# Patient Record
Sex: Male | Born: 1935 | Race: White | Hispanic: No | Marital: Married | Smoking: Never smoker
Health system: Southern US, Community
[De-identification: ages and names within clinical notes are randomized; demographics above are authoritative.]

## PROBLEM LIST (undated history)

## (undated) DIAGNOSIS — I10 Essential (primary) hypertension: Secondary | ICD-10-CM

## (undated) DIAGNOSIS — K589 Irritable bowel syndrome without diarrhea: Secondary | ICD-10-CM

## (undated) DIAGNOSIS — K219 Gastro-esophageal reflux disease without esophagitis: Secondary | ICD-10-CM

## (undated) DIAGNOSIS — N400 Enlarged prostate without lower urinary tract symptoms: Secondary | ICD-10-CM

## (undated) DIAGNOSIS — D126 Benign neoplasm of colon, unspecified: Secondary | ICD-10-CM

## (undated) DIAGNOSIS — C439 Malignant melanoma of skin, unspecified: Secondary | ICD-10-CM

## (undated) DIAGNOSIS — I639 Cerebral infarction, unspecified: Secondary | ICD-10-CM

## (undated) DIAGNOSIS — M4802 Spinal stenosis, cervical region: Secondary | ICD-10-CM

## (undated) DIAGNOSIS — N200 Calculus of kidney: Secondary | ICD-10-CM

## (undated) DIAGNOSIS — E119 Type 2 diabetes mellitus without complications: Secondary | ICD-10-CM

## (undated) DIAGNOSIS — F419 Anxiety disorder, unspecified: Secondary | ICD-10-CM

## (undated) DIAGNOSIS — H9190 Unspecified hearing loss, unspecified ear: Secondary | ICD-10-CM

## (undated) HISTORY — DX: Spinal stenosis, cervical region: M48.02

## (undated) HISTORY — DX: Type 2 diabetes mellitus without complications: E11.9

## (undated) HISTORY — PX: COLONOSCOPY W/ POLYPECTOMY: SHX1380

## (undated) HISTORY — DX: Benign prostatic hyperplasia without lower urinary tract symptoms: N40.0

## (undated) HISTORY — DX: Calculus of kidney: N20.0

## (undated) HISTORY — DX: Unspecified hearing loss, unspecified ear: H91.90

## (undated) HISTORY — PX: CATARACT EXTRACTION: SUR2

## (undated) HISTORY — DX: Cerebral infarction, unspecified: I63.9

## (undated) HISTORY — DX: Anxiety disorder, unspecified: F41.9

## (undated) HISTORY — DX: Benign neoplasm of colon, unspecified: D12.6

## (undated) HISTORY — DX: Irritable bowel syndrome, unspecified: K58.9

## (undated) HISTORY — PX: CHOLECYSTECTOMY: SHX55

## (undated) HISTORY — DX: Essential (primary) hypertension: I10

## (undated) HISTORY — PX: CYSTOSCOPY: SUR368

## (undated) HISTORY — DX: Malignant melanoma of skin, unspecified: C43.9

## (undated) HISTORY — DX: Gastro-esophageal reflux disease without esophagitis: K21.9

---

## 1995-09-17 ENCOUNTER — Encounter: Payer: Self-pay | Admitting: Gastroenterology

## 1995-11-22 ENCOUNTER — Encounter: Payer: Self-pay | Admitting: Gastroenterology

## 1995-11-22 ENCOUNTER — Encounter: Payer: Self-pay | Admitting: Internal Medicine

## 1995-11-23 ENCOUNTER — Encounter: Payer: Self-pay | Admitting: Gastroenterology

## 1996-11-27 DIAGNOSIS — D126 Benign neoplasm of colon, unspecified: Secondary | ICD-10-CM

## 1996-11-27 HISTORY — DX: Benign neoplasm of colon, unspecified: D12.6

## 1996-12-14 ENCOUNTER — Encounter: Payer: Self-pay | Admitting: Gastroenterology

## 1999-11-28 ENCOUNTER — Encounter (INDEPENDENT_AMBULATORY_CARE_PROVIDER_SITE_OTHER): Payer: Self-pay | Admitting: Specialist

## 1999-11-28 ENCOUNTER — Encounter: Payer: Self-pay | Admitting: Gastroenterology

## 1999-11-28 ENCOUNTER — Other Ambulatory Visit: Admission: RE | Admit: 1999-11-28 | Discharge: 1999-11-28 | Payer: Self-pay | Admitting: Gastroenterology

## 2000-08-28 ENCOUNTER — Encounter: Payer: Self-pay | Admitting: Internal Medicine

## 2000-08-28 ENCOUNTER — Ambulatory Visit (HOSPITAL_COMMUNITY): Admission: RE | Admit: 2000-08-28 | Discharge: 2000-08-28 | Payer: Self-pay | Admitting: Internal Medicine

## 2002-08-07 ENCOUNTER — Encounter: Payer: Self-pay | Admitting: Internal Medicine

## 2002-08-07 ENCOUNTER — Encounter: Admission: RE | Admit: 2002-08-07 | Discharge: 2002-08-07 | Payer: Self-pay | Admitting: Internal Medicine

## 2004-10-24 ENCOUNTER — Ambulatory Visit: Payer: Self-pay | Admitting: Internal Medicine

## 2005-06-26 ENCOUNTER — Encounter: Admission: RE | Admit: 2005-06-26 | Discharge: 2005-06-26 | Payer: Self-pay | Admitting: Urology

## 2005-07-02 ENCOUNTER — Encounter (INDEPENDENT_AMBULATORY_CARE_PROVIDER_SITE_OTHER): Payer: Self-pay | Admitting: *Deleted

## 2005-07-02 ENCOUNTER — Ambulatory Visit (HOSPITAL_COMMUNITY): Admission: RE | Admit: 2005-07-02 | Discharge: 2005-07-02 | Payer: Self-pay | Admitting: Urology

## 2005-07-02 ENCOUNTER — Ambulatory Visit (HOSPITAL_BASED_OUTPATIENT_CLINIC_OR_DEPARTMENT_OTHER): Admission: RE | Admit: 2005-07-02 | Discharge: 2005-07-02 | Payer: Self-pay | Admitting: Urology

## 2005-08-06 ENCOUNTER — Ambulatory Visit: Payer: Self-pay | Admitting: Internal Medicine

## 2005-08-08 ENCOUNTER — Ambulatory Visit: Payer: Self-pay | Admitting: Internal Medicine

## 2005-12-11 ENCOUNTER — Ambulatory Visit: Payer: Self-pay | Admitting: Internal Medicine

## 2005-12-18 ENCOUNTER — Ambulatory Visit: Payer: Self-pay | Admitting: Internal Medicine

## 2006-02-02 ENCOUNTER — Ambulatory Visit: Payer: Self-pay | Admitting: Gastroenterology

## 2006-02-09 ENCOUNTER — Ambulatory Visit: Payer: Self-pay | Admitting: Gastroenterology

## 2006-02-09 ENCOUNTER — Encounter (INDEPENDENT_AMBULATORY_CARE_PROVIDER_SITE_OTHER): Payer: Self-pay | Admitting: *Deleted

## 2006-02-09 LAB — HM COLONOSCOPY

## 2006-03-16 ENCOUNTER — Ambulatory Visit: Payer: Self-pay | Admitting: Gastroenterology

## 2006-04-23 ENCOUNTER — Ambulatory Visit: Payer: Self-pay | Admitting: Internal Medicine

## 2006-05-27 ENCOUNTER — Ambulatory Visit: Payer: Self-pay | Admitting: Internal Medicine

## 2006-07-08 ENCOUNTER — Ambulatory Visit: Payer: Self-pay

## 2006-09-04 ENCOUNTER — Ambulatory Visit: Payer: Self-pay | Admitting: Internal Medicine

## 2006-09-04 LAB — CONVERTED CEMR LAB
ALT: 16 units/L (ref 0–40)
BUN: 21 mg/dL (ref 6–23)
Cholesterol: 100 mg/dL (ref 0–200)
Hgb A1c MFr Bld: 6.1 % — ABNORMAL HIGH (ref 4.6–6.0)
LDL Cholesterol: 48 mg/dL (ref 0–99)

## 2006-09-16 ENCOUNTER — Ambulatory Visit: Payer: Self-pay | Admitting: Internal Medicine

## 2007-01-21 DIAGNOSIS — I152 Hypertension secondary to endocrine disorders: Secondary | ICD-10-CM | POA: Insufficient documentation

## 2007-01-21 DIAGNOSIS — Z86010 Personal history of colonic polyps: Secondary | ICD-10-CM | POA: Insufficient documentation

## 2007-01-21 DIAGNOSIS — M4802 Spinal stenosis, cervical region: Secondary | ICD-10-CM | POA: Insufficient documentation

## 2007-01-21 DIAGNOSIS — N400 Enlarged prostate without lower urinary tract symptoms: Secondary | ICD-10-CM | POA: Insufficient documentation

## 2007-01-21 DIAGNOSIS — Z85820 Personal history of malignant melanoma of skin: Secondary | ICD-10-CM | POA: Insufficient documentation

## 2007-01-21 DIAGNOSIS — K589 Irritable bowel syndrome without diarrhea: Secondary | ICD-10-CM | POA: Insufficient documentation

## 2007-01-21 DIAGNOSIS — C439 Malignant melanoma of skin, unspecified: Secondary | ICD-10-CM

## 2007-01-21 DIAGNOSIS — I10 Essential (primary) hypertension: Secondary | ICD-10-CM | POA: Insufficient documentation

## 2007-03-10 ENCOUNTER — Ambulatory Visit: Payer: Self-pay | Admitting: Internal Medicine

## 2007-03-10 DIAGNOSIS — R1013 Epigastric pain: Secondary | ICD-10-CM

## 2007-03-10 DIAGNOSIS — R059 Cough, unspecified: Secondary | ICD-10-CM | POA: Insufficient documentation

## 2007-03-10 DIAGNOSIS — R12 Heartburn: Secondary | ICD-10-CM | POA: Insufficient documentation

## 2007-03-10 DIAGNOSIS — K3189 Other diseases of stomach and duodenum: Secondary | ICD-10-CM

## 2007-03-10 DIAGNOSIS — R05 Cough: Secondary | ICD-10-CM | POA: Insufficient documentation

## 2007-03-16 ENCOUNTER — Telehealth (INDEPENDENT_AMBULATORY_CARE_PROVIDER_SITE_OTHER): Payer: Self-pay | Admitting: *Deleted

## 2007-03-22 ENCOUNTER — Ambulatory Visit: Payer: Self-pay | Admitting: Internal Medicine

## 2007-03-23 ENCOUNTER — Encounter (INDEPENDENT_AMBULATORY_CARE_PROVIDER_SITE_OTHER): Payer: Self-pay | Admitting: *Deleted

## 2007-03-23 LAB — CONVERTED CEMR LAB
ALT: 15 units/L (ref 0–40)
BUN: 19 mg/dL (ref 6–23)
Cholesterol: 121 mg/dL (ref 0–200)
Hgb A1c MFr Bld: 6.6 % — ABNORMAL HIGH (ref 4.6–6.0)
Microalb, Ur: 0.4 mg/dL (ref 0.0–1.9)
Potassium: 4.2 meq/L (ref 3.5–5.1)
Total CHOL/HDL Ratio: 3.1
Triglycerides: 99 mg/dL (ref 0–149)

## 2007-03-24 ENCOUNTER — Telehealth (INDEPENDENT_AMBULATORY_CARE_PROVIDER_SITE_OTHER): Payer: Self-pay | Admitting: *Deleted

## 2007-04-13 ENCOUNTER — Ambulatory Visit: Payer: Self-pay | Admitting: Internal Medicine

## 2007-07-08 ENCOUNTER — Ambulatory Visit: Payer: Self-pay | Admitting: Internal Medicine

## 2007-07-08 DIAGNOSIS — T887XXA Unspecified adverse effect of drug or medicament, initial encounter: Secondary | ICD-10-CM | POA: Insufficient documentation

## 2007-07-08 DIAGNOSIS — F419 Anxiety disorder, unspecified: Secondary | ICD-10-CM

## 2007-08-03 ENCOUNTER — Ambulatory Visit: Payer: Self-pay | Admitting: Internal Medicine

## 2007-08-18 ENCOUNTER — Telehealth (INDEPENDENT_AMBULATORY_CARE_PROVIDER_SITE_OTHER): Payer: Self-pay | Admitting: *Deleted

## 2007-08-30 ENCOUNTER — Encounter: Payer: Self-pay | Admitting: Internal Medicine

## 2007-09-07 ENCOUNTER — Encounter: Payer: Self-pay | Admitting: Internal Medicine

## 2007-11-16 ENCOUNTER — Ambulatory Visit: Payer: Self-pay | Admitting: Internal Medicine

## 2007-11-16 DIAGNOSIS — R000 Tachycardia, unspecified: Secondary | ICD-10-CM

## 2007-11-16 DIAGNOSIS — E1169 Type 2 diabetes mellitus with other specified complication: Secondary | ICD-10-CM | POA: Insufficient documentation

## 2007-11-16 DIAGNOSIS — E1149 Type 2 diabetes mellitus with other diabetic neurological complication: Secondary | ICD-10-CM

## 2007-11-22 ENCOUNTER — Encounter (INDEPENDENT_AMBULATORY_CARE_PROVIDER_SITE_OTHER): Payer: Self-pay | Admitting: *Deleted

## 2008-05-03 ENCOUNTER — Ambulatory Visit: Payer: Self-pay | Admitting: Internal Medicine

## 2008-05-03 LAB — CONVERTED CEMR LAB: Hgb A1c MFr Bld: 6.2 % — ABNORMAL HIGH (ref 4.6–6.0)

## 2008-05-11 ENCOUNTER — Ambulatory Visit: Payer: Self-pay | Admitting: Internal Medicine

## 2008-05-11 DIAGNOSIS — M5412 Radiculopathy, cervical region: Secondary | ICD-10-CM | POA: Insufficient documentation

## 2008-05-11 DIAGNOSIS — Z87442 Personal history of urinary calculi: Secondary | ICD-10-CM

## 2008-09-11 ENCOUNTER — Encounter: Payer: Self-pay | Admitting: Internal Medicine

## 2008-11-16 ENCOUNTER — Ambulatory Visit: Payer: Self-pay | Admitting: Internal Medicine

## 2008-11-16 DIAGNOSIS — N6019 Diffuse cystic mastopathy of unspecified breast: Secondary | ICD-10-CM

## 2008-11-22 ENCOUNTER — Ambulatory Visit: Payer: Self-pay | Admitting: Internal Medicine

## 2008-11-22 LAB — CONVERTED CEMR LAB
OCCULT 1: NEGATIVE
OCCULT 2: NEGATIVE

## 2008-11-23 ENCOUNTER — Encounter (INDEPENDENT_AMBULATORY_CARE_PROVIDER_SITE_OTHER): Payer: Self-pay | Admitting: *Deleted

## 2008-12-03 LAB — CONVERTED CEMR LAB
ALT: 15 units/L (ref 0–53)
AST: 23 units/L (ref 0–37)
Albumin: 4.2 g/dL (ref 3.5–5.2)
Alkaline Phosphatase: 35 units/L — ABNORMAL LOW (ref 39–117)
BUN: 23 mg/dL (ref 6–23)
Basophils Absolute: 0 10*3/uL (ref 0.0–0.1)
Basophils Relative: 0.3 % (ref 0.0–3.0)
Eosinophils Absolute: 0.1 10*3/uL (ref 0.0–0.7)
HDL: 58.2 mg/dL (ref >39.0)
Hgb A1c MFr Bld: 6 % (ref 4.6–6.0)
LDL Cholesterol: 68 mg/dL (ref 0–99)
Lymphocytes Relative: 17 % (ref 12.0–46.0)
MCHC: 34.6 g/dL (ref 30.0–36.0)
Microalb, Ur: 0.6 mg/dL (ref 0.0–1.9)
Neutrophils Relative %: 73 % (ref 43.0–77.0)
Platelets: 167 10*3/uL (ref 150–400)
RBC: 4.56 M/uL (ref 4.22–5.81)
WBC: 7.6 10*3/uL (ref 4.5–10.5)

## 2008-12-04 ENCOUNTER — Encounter (INDEPENDENT_AMBULATORY_CARE_PROVIDER_SITE_OTHER): Payer: Self-pay | Admitting: *Deleted

## 2009-04-12 ENCOUNTER — Telehealth (INDEPENDENT_AMBULATORY_CARE_PROVIDER_SITE_OTHER): Payer: Self-pay | Admitting: *Deleted

## 2009-04-16 ENCOUNTER — Ambulatory Visit: Payer: Self-pay | Admitting: Internal Medicine

## 2009-04-16 DIAGNOSIS — M79609 Pain in unspecified limb: Secondary | ICD-10-CM

## 2009-04-17 ENCOUNTER — Ambulatory Visit: Payer: Self-pay | Admitting: Internal Medicine

## 2009-04-19 ENCOUNTER — Telehealth (INDEPENDENT_AMBULATORY_CARE_PROVIDER_SITE_OTHER): Payer: Self-pay | Admitting: *Deleted

## 2009-04-19 ENCOUNTER — Encounter (INDEPENDENT_AMBULATORY_CARE_PROVIDER_SITE_OTHER): Payer: Self-pay | Admitting: *Deleted

## 2009-04-20 ENCOUNTER — Ambulatory Visit: Payer: Self-pay | Admitting: Internal Medicine

## 2009-04-23 ENCOUNTER — Encounter (INDEPENDENT_AMBULATORY_CARE_PROVIDER_SITE_OTHER): Payer: Self-pay | Admitting: *Deleted

## 2009-05-17 ENCOUNTER — Encounter: Payer: Self-pay | Admitting: Internal Medicine

## 2009-07-24 ENCOUNTER — Telehealth (INDEPENDENT_AMBULATORY_CARE_PROVIDER_SITE_OTHER): Payer: Self-pay | Admitting: *Deleted

## 2009-10-17 ENCOUNTER — Encounter: Payer: Self-pay | Admitting: Internal Medicine

## 2009-11-06 ENCOUNTER — Encounter (INDEPENDENT_AMBULATORY_CARE_PROVIDER_SITE_OTHER): Payer: Self-pay | Admitting: *Deleted

## 2009-11-12 ENCOUNTER — Ambulatory Visit: Payer: Self-pay | Admitting: Internal Medicine

## 2009-11-12 DIAGNOSIS — M545 Low back pain, unspecified: Secondary | ICD-10-CM | POA: Insufficient documentation

## 2009-11-14 ENCOUNTER — Ambulatory Visit: Payer: Self-pay | Admitting: Internal Medicine

## 2009-11-19 LAB — CONVERTED CEMR LAB
ALT: 13 units/L (ref 0–53)
Albumin: 3.7 g/dL (ref 3.5–5.2)
Alkaline Phosphatase: 38 units/L — ABNORMAL LOW (ref 39–117)
BUN: 20 mg/dL (ref 6–23)
Bilirubin, Direct: 0.1 mg/dL (ref 0.0–0.3)
CO2: 28 meq/L (ref 19–32)
Cholesterol: 118 mg/dL (ref 0–200)
Creatinine,U: 187.8 mg/dL
Glucose, Bld: 108 mg/dL — ABNORMAL HIGH (ref 70–99)
HDL: 52 mg/dL (ref >39.00)
Hgb A1c MFr Bld: 6.2 % (ref 4.6–6.5)
Microalb Creat Ratio: 4.8 mg/g (ref 0.0–30.0)
Sodium: 142 meq/L (ref 135–145)
Total CHOL/HDL Ratio: 2
VLDL: 8.4 mg/dL (ref 0.0–40.0)

## 2009-12-10 ENCOUNTER — Encounter (INDEPENDENT_AMBULATORY_CARE_PROVIDER_SITE_OTHER): Payer: Self-pay | Admitting: *Deleted

## 2009-12-21 ENCOUNTER — Inpatient Hospital Stay (HOSPITAL_COMMUNITY): Admission: EM | Admit: 2009-12-21 | Discharge: 2009-12-24 | Payer: Self-pay | Admitting: Emergency Medicine

## 2009-12-21 ENCOUNTER — Encounter: Payer: Self-pay | Admitting: Internal Medicine

## 2009-12-21 ENCOUNTER — Ambulatory Visit: Payer: Self-pay | Admitting: Cardiology

## 2009-12-21 ENCOUNTER — Ambulatory Visit: Payer: Self-pay | Admitting: Vascular Surgery

## 2009-12-21 ENCOUNTER — Telehealth: Payer: Self-pay | Admitting: Internal Medicine

## 2009-12-21 ENCOUNTER — Encounter (INDEPENDENT_AMBULATORY_CARE_PROVIDER_SITE_OTHER): Payer: Self-pay | Admitting: Internal Medicine

## 2009-12-31 ENCOUNTER — Ambulatory Visit: Payer: Self-pay | Admitting: Internal Medicine

## 2009-12-31 DIAGNOSIS — Z8673 Personal history of transient ischemic attack (TIA), and cerebral infarction without residual deficits: Secondary | ICD-10-CM

## 2010-01-01 ENCOUNTER — Encounter: Payer: Self-pay | Admitting: Internal Medicine

## 2010-01-02 ENCOUNTER — Ambulatory Visit: Payer: Self-pay | Admitting: Internal Medicine

## 2010-01-07 ENCOUNTER — Ambulatory Visit: Payer: Self-pay | Admitting: Internal Medicine

## 2010-01-10 ENCOUNTER — Telehealth: Payer: Self-pay | Admitting: Internal Medicine

## 2010-01-11 ENCOUNTER — Telehealth: Payer: Self-pay | Admitting: Internal Medicine

## 2010-01-14 ENCOUNTER — Ambulatory Visit: Payer: Self-pay | Admitting: Internal Medicine

## 2010-01-14 ENCOUNTER — Telehealth (INDEPENDENT_AMBULATORY_CARE_PROVIDER_SITE_OTHER): Payer: Self-pay | Admitting: *Deleted

## 2010-01-14 DIAGNOSIS — I499 Cardiac arrhythmia, unspecified: Secondary | ICD-10-CM | POA: Insufficient documentation

## 2010-01-15 ENCOUNTER — Encounter: Payer: Self-pay | Admitting: Internal Medicine

## 2010-01-23 ENCOUNTER — Encounter: Payer: Self-pay | Admitting: Internal Medicine

## 2010-02-07 ENCOUNTER — Ambulatory Visit: Payer: Self-pay | Admitting: Internal Medicine

## 2010-02-11 LAB — CONVERTED CEMR LAB
Creatinine,U: 257.1 mg/dL
Hgb A1c MFr Bld: 6.5 % (ref 4.6–6.5)

## 2010-02-22 ENCOUNTER — Encounter: Payer: Self-pay | Admitting: Internal Medicine

## 2010-02-22 ENCOUNTER — Telehealth: Payer: Self-pay | Admitting: Internal Medicine

## 2010-02-26 ENCOUNTER — Encounter: Payer: Self-pay | Admitting: Internal Medicine

## 2010-03-01 ENCOUNTER — Encounter: Payer: Self-pay | Admitting: Internal Medicine

## 2010-03-04 ENCOUNTER — Ambulatory Visit: Payer: Self-pay | Admitting: Gastroenterology

## 2010-03-04 DIAGNOSIS — R1319 Other dysphagia: Secondary | ICD-10-CM | POA: Insufficient documentation

## 2010-03-04 DIAGNOSIS — K5900 Constipation, unspecified: Secondary | ICD-10-CM | POA: Insufficient documentation

## 2010-03-04 DIAGNOSIS — R1310 Dysphagia, unspecified: Secondary | ICD-10-CM | POA: Insufficient documentation

## 2010-03-20 ENCOUNTER — Ambulatory Visit: Payer: Self-pay | Admitting: Gastroenterology

## 2010-03-25 ENCOUNTER — Encounter: Payer: Self-pay | Admitting: Gastroenterology

## 2010-04-11 ENCOUNTER — Ambulatory Visit: Payer: Self-pay | Admitting: Internal Medicine

## 2010-04-11 LAB — CONVERTED CEMR LAB
ALT: 11 units/L (ref 0–53)
AST: 16 units/L (ref 0–37)
BUN: 27 mg/dL — ABNORMAL HIGH (ref 6–23)
Cholesterol: 105 mg/dL (ref 0–200)
Creatinine, Ser: 1.1 mg/dL (ref 0.4–1.5)
Creatinine,U: 216.4 mg/dL
HDL: 54.7 mg/dL (ref >39.00)
Hgb A1c MFr Bld: 6.8 % — ABNORMAL HIGH (ref 4.6–6.5)
Microalb Creat Ratio: 0.5 mg/g (ref 0.0–30.0)
Microalb, Ur: 1 mg/dL (ref 0.0–1.9)
Total Protein: 6.3 g/dL (ref 6.0–8.3)
Triglycerides: 43 mg/dL (ref 0.0–149.0)
VLDL: 8.6 mg/dL (ref 0.0–40.0)

## 2010-04-18 ENCOUNTER — Ambulatory Visit: Payer: Self-pay | Admitting: Internal Medicine

## 2010-04-18 DIAGNOSIS — R29898 Other symptoms and signs involving the musculoskeletal system: Secondary | ICD-10-CM

## 2010-04-22 LAB — CONVERTED CEMR LAB
Magnesium: 1.9 mg/dL (ref 1.5–2.5)
Potassium: 4.4 meq/L (ref 3.5–5.1)

## 2010-06-10 ENCOUNTER — Encounter: Payer: Self-pay | Admitting: Internal Medicine

## 2010-06-25 ENCOUNTER — Telehealth (INDEPENDENT_AMBULATORY_CARE_PROVIDER_SITE_OTHER): Payer: Self-pay | Admitting: *Deleted

## 2010-07-09 ENCOUNTER — Telehealth (INDEPENDENT_AMBULATORY_CARE_PROVIDER_SITE_OTHER): Payer: Self-pay | Admitting: *Deleted

## 2010-07-26 ENCOUNTER — Ambulatory Visit: Payer: Self-pay | Admitting: Internal Medicine

## 2010-07-26 LAB — CONVERTED CEMR LAB
Cholesterol: 91 mg/dL (ref 0–200)
Total CHOL/HDL Ratio: 2
Triglycerides: 25 mg/dL (ref 0.0–149.0)
VLDL: 5 mg/dL (ref 0.0–40.0)

## 2010-07-31 ENCOUNTER — Telehealth (INDEPENDENT_AMBULATORY_CARE_PROVIDER_SITE_OTHER): Payer: Self-pay | Admitting: *Deleted

## 2010-08-05 ENCOUNTER — Ambulatory Visit: Payer: Self-pay | Admitting: Internal Medicine

## 2010-09-09 ENCOUNTER — Ambulatory Visit: Payer: Self-pay | Admitting: Internal Medicine

## 2010-09-09 DIAGNOSIS — K219 Gastro-esophageal reflux disease without esophagitis: Secondary | ICD-10-CM | POA: Insufficient documentation

## 2010-09-11 LAB — CONVERTED CEMR LAB
Eosinophils Absolute: 0.1 10*3/uL (ref 0.0–0.7)
HCT: 42.1 % (ref 39.0–52.0)
Hgb A1c MFr Bld: 6.2 % (ref 4.6–6.5)
Lymphocytes Relative: 14.2 % (ref 12.0–46.0)
Lymphs Abs: 1.4 10*3/uL (ref 0.7–4.0)
Monocytes Absolute: 0.9 10*3/uL (ref 0.1–1.0)
Monocytes Relative: 9.1 % (ref 3.0–12.0)
Neutro Abs: 7.5 10*3/uL (ref 1.4–7.7)
RBC: 4.26 M/uL (ref 4.22–5.81)

## 2010-10-14 ENCOUNTER — Ambulatory Visit
Admission: RE | Admit: 2010-10-14 | Discharge: 2010-10-14 | Payer: Self-pay | Source: Home / Self Care | Attending: Internal Medicine | Admitting: Internal Medicine

## 2010-10-21 ENCOUNTER — Encounter: Payer: Self-pay | Admitting: Internal Medicine

## 2010-10-27 LAB — CONVERTED CEMR LAB
ALT: 15 units/L (ref 0–53)
AST: 23 units/L (ref 0–37)
Creatinine, Ser: 1.2 mg/dL (ref 0.4–1.5)
Creatinine,U: 195.1 mg/dL
Eosinophils Absolute: 0.1 10*3/uL (ref 0.0–0.6)
Hemoglobin: 15.1 g/dL (ref 13.0–17.0)
Lymphocytes Relative: 16.2 % (ref 12.0–46.0)
MCV: 97.8 fL (ref 78.0–100.0)
Microalb Creat Ratio: 4.6 mg/g (ref 0.0–30.0)
Microalb, Ur: 0.9 mg/dL (ref 0.0–1.9)
Monocytes Relative: 9.1 % (ref 3.0–11.0)
TSH: 3.56 microintl units/mL (ref 0.35–5.50)

## 2010-10-31 NOTE — Progress Notes (Signed)
Summary: concern re pulse rate  Phone Note Call from Patient Call back at Home Phone [phone]   Caller: Spouse Summary of Call: patient wife called -dose for metroprolol was decreased [id number] - patient bp this am was 140-76 pulse 76 ---pm 138/68 pulse 53 - she is concerned pulse too low  Initial call taken by: Okey Regal Spring,  January 11, 2010 2:17 PM  Follow-up for Phone Call        bp Med WAS JUST DECREASE ON YESTERDAY TO metoprolol 50 1/2 once daily & monitor pulse rate. PLS ADVISE .............Marland KitchenFelecia Deloach CMA  January 11, 2010 2:33 PM   Additional Follow-up for Phone Call Additional follow up Details #1::        Change Metoprolol to Amlodipine 2.5 mg once daily #30 Additional Follow-up by: Marga Melnick MD,  January 11, 2010 2:57 PM    Additional Follow-up for Phone Call Additional follow up Details #2::    pt wife aware rx sent to pharmacy...............Marland KitchenFelecia Deloach CMA  January 11, 2010 3:33 PM   New/Updated Medications: AMLODIPINE BESYLATE 2.5 MG TABS (AMLODIPINE BESYLATE) take 1 tab once daily Prescriptions: AMLODIPINE BESYLATE 2.5 MG TABS (AMLODIPINE BESYLATE) take 1 tab once daily  #30 x 0   Entered by:   Jeremy Johann CMA   Authorized by:   Marga Melnick MD   Signed by:   Jeremy Johann CMA on 01/11/2010   Method used:   Faxed to ...       [facility]* (retail)       [address]       [place], [place]  [address]       Ph: [phone] or [phone]       Fax: [phone]   RxID:   [id number]

## 2010-10-31 NOTE — Procedures (Signed)
Summary: [facility]   Imported By: Sherian Rein 01/08/2010 09:33:20  _____________________________________________________________________  External Attachment:    Type:   Image     Comment:   External Document

## 2010-10-31 NOTE — Letter (Signed)
Summary: [facility]   Imported By: Lanelle Bal 10/29/2009 11:42:58  _____________________________________________________________________  External Attachment:    Type:   Image     Comment:   External Document

## 2010-10-31 NOTE — Assessment & Plan Note (Signed)
Summary: HOSPTIAL FU CVA/RH........Marland Kitchen   Vital Signs:  Patient profile:   77 year old male Weight:      198.4 pounds Temp:     97.6 degrees F oral Pulse rate:   56 / minute Resp:     15 per minute BP sitting:   104 / 68  (left arm) Cuff size:   large  Vitals Entered By: Shonna Chock (December 31, 2009 11:49 AM) CC: Hospital Follow-up Comments REVIEWED MED LIST, PATIENT AGREED DOSE AND INSTRUCTION CORRECT    CC:  Hospital Follow-up.  History of Present Illness: Since D/C he has asymptomatic; he has no residual deficit. He is in PT as OP.OT & Speech Therapy stopped coming.BP @  home 119/52 @ home, low 95/60- high 121/60.  Allergies: 1)  ! * Metformin 1000mg   Past History:  Past Medical History: Colonic polyps, hx of, Dr Russella Dar Diabetes mellitus, type II Hypertension Benign prostatic hypertrophy melanoma anxiety IBS cervical spinal stenosis Nephrolithiasis, hx of Cerebrovascular accident, hx of, L  MCA  11/2009  Review of Systems Eyes:  Denies blurring, double vision, and vision loss-both eyes. Neuro:  Denies brief paralysis, difficulty with concentration, disturbances in coordination, falling down, headaches, inability to speak, memory loss, numbness, poor balance, sensation of room spinning, tingling, tremors, visual disturbances, and weakness.  Physical Exam  General:  well-nourished;alert,appropriate and cooperative throughout examination; appears younger than age  Lungs:  Normal respiratory effort, chest expands symmetrically. Lungs are clear to auscultation, no crackles or wheezes. Heart:  regular rhythm and bradycardia.  S4 Neurologic:  alert & oriented X3, strength normal in all extremities, gait purposeful, and DTRs symmetrical and normal.   Psych:  memory intact for recent and remote, normally interactive, and good eye contact.     Impression & Recommendations:  Problem # 1:  CEREBROVASCULAR ACCIDENT, HX OF (ICD-V12.50) 11/2009  Problem # 2:  UNSPECIFIED  ESSENTIAL HYPERTENSION (ICD-401.9)  His updated medication list for this problem includes:    Lisinopril 40 Mg Tabs (Lisinopril) .Marland Kitchen... 1 by mouth qd    Metoprolol Tartrate 50 Mg Tabs (Metoprolol tartrate) .Marland Kitchen... 1/2 two times a day  Problem # 3:  DIABETES MELLITUS, TYPE II, CONTROLLED (ICD-250.00)  His updated medication list for this problem includes:    Glimepiride 2 Mg Tabs (Glimepiride) .Marland Kitchen... 1/2 tab qd    Lisinopril 40 Mg Tabs (Lisinopril) .Marland Kitchen... 1 by mouth qd    Actoplus Met 15-850 Mg Tabs (Pioglitazone hcl-metformin hcl) .Marland Kitchen... 1 by mouth two times a day once daily with largest meal    Aspirin 325 Mg Tabs (Aspirin) .Marland Kitchen... 1 by mouth once daily  Complete Medication List: 1)  Glimepiride 2 Mg Tabs (Glimepiride) .... 1/2 tab qd 2)  Lisinopril 40 Mg Tabs (Lisinopril) .Marland Kitchen.. 1 by mouth qd 3)  Actoplus Met 15-850 Mg Tabs (Pioglitazone hcl-metformin hcl) .Marland Kitchen.. 1 by mouth two times a day once daily with largest meal 4)  Viagra 100 Mg Tabs (Sildenafil citrate) .... 1/2 -1 once daily prn 5)  Tramadol Hcl 50 Mg Tabs (Tramadol hcl) .Marland Kitchen.. 1 q 6 hrs as needed pain 6)  Simvastatin 20 Mg Tabs (Simvastatin) .Marland Kitchen.. 1 by mouth at bedtime 7)  Metoprolol Tartrate 50 Mg Tabs (Metoprolol tartrate) .... 1/2 two times a day 8)  Aspirin 325 Mg Tabs (Aspirin) .Marland Kitchen.. 1 by mouth once daily 9)  Vitamin E 400 Unit Caps (Vitamin e) .Marland Kitchen.. 1 by mouth once daily 10)  Fish Oil 1000 Mg Caps (Omega-3 fatty acids) .Marland Kitchen.. 1 by mouth  once daily 11)  Icaps Caps (Multiple vitamins-minerals) .Marland Kitchen.. 1 by mouth once daily  Patient Instructions: 1)  Check your Blood Pressure regularly. If it is above:135/85 ON AVERAGE  you should make an appointment. 2)  Check your blood sugars regularly. If your readings are usually above : 150 or below 90  you should contact our office. 3)  Please schedule a follow-up appointment in 3 months. 4)  BUN,creat,K+ prior to visit, ICD-9:401.9 5)  Hepatic Panel prior to visit, ICD-9:995.20 6)  Lipid Panel prior  to visit, ICD-9:272.4 7)  HbgA1C prior to visit, ICD-9: 250.00 8)  Urine Microalbumin prior to visit, ICD-9:250.00 Prescriptions: METOPROLOL TARTRATE 50 MG TABS (METOPROLOL TARTRATE) 1/2 two times a day  #90 x 1   Entered and Authorized by:   Marga Melnick MD   Signed by:   Marga Melnick MD on 12/31/2009   Method used:   Print then Give to Patient   RxID:   [id number]

## 2010-10-31 NOTE — Miscellaneous (Signed)
Summary: omeprazole rx.  Clinical Lists Changes  Medications: Added new medication of OMEPRAZOLE 20 MG  CPDR (OMEPRAZOLE) 1 each day 30 minutes before meal - Signed Rx of OMEPRAZOLE 20 MG  CPDR (OMEPRAZOLE) 1 each day 30 minutes before meal;  #30 x 11;  Signed;  Entered by: Oda Cogan RN;  Authorized by: Meryl Dare MD [facility];  Method used: Electronically to [facility] [address] [place], [place], [place]  [address], Ph: [phone] or [phone], Fax: [phone]    Prescriptions: OMEPRAZOLE 20 MG  CPDR (OMEPRAZOLE) 1 each day 30 minutes before meal  #30 x 11   Entered by:   Oda Cogan RN   Authorized by:   Meryl Dare MD [facility]   Signed by:   Oda Cogan RN on 03/20/2010   Method used:   Electronically to        [facility] (retail)       [address]       [place], [place]  [address]       Ph: [phone] or [phone]       Fax: [phone]   RxID:   [id number]

## 2010-10-31 NOTE — Procedures (Signed)
Summary: [facility]   Imported By: Sherian Rein 01/08/2010 09:25:13  _____________________________________________________________________  External Attachment:    Type:   Image     Comment:   External Document

## 2010-10-31 NOTE — Assessment & Plan Note (Signed)
Summary: Gastroenterology  Brett Franklin MR#:  [id number] Page #  NAME:  Brett, Franklin  OFFICE NO:  [id number]  DATE:  03/16/06  DOB:  September 08, 2026  HISTORY OF PRESENT ILLNESS:  This is a return office visit following colonoscopy and upper endoscopy.  Please see reports dated Feb 09, 2006.  He has no gastrointestinal complaints and feels well on Protonix.    CURRENT MEDICATIONS:  Listed on the chart, updated and reviewed.  MEDICATION ALLERGIES:  None known.  PHYSICAL EXAM:  No acute distress.  Weight is 206.2 pounds.  Blood pressure is 122/78.  Pulse is 72 and regular.  Chest is clear to auscultation bilaterally.  Cardiac:  Regular rate and rhythm without murmurs.  Abdomen is soft and nontender with normoactive bowel sounds.  ASSESSMENT AND PLAN:   1.  Constipation.  Continue long-term high fiber diet with adequate fluid intake.  May use a stool softener or mild laxative such as Milk of Magnesia or MiraLax p.r.n. 2.  Internal hemorrhoids.  Expectant management. 3.  Personal history of adenomatous colon polyps.  Recall colonoscopy recommended for May 2012. 4.  Gastroesophageal reflux disease.  Maintain proton pump inhibitor and antireflux measures.  Ongoing followup with Dr. Alwyn Ren, I will see back on referral.      Judie Petit T. Russella Dar, M.D., F.A.C.G.  [id number] cc:  Marga Melnick, M.D. D:  03/22/06; T:  ; Job [id number]

## 2010-10-31 NOTE — Medication Information (Signed)
Summary: Amlodipine Change/[facility]  Amlodipine Change/[facility]   Imported By: Lanelle Bal 03/08/2010 11:57:20  _____________________________________________________________________  External Attachment:    Type:   Image     Comment:   External Document

## 2010-10-31 NOTE — Progress Notes (Signed)
Summary: info on any med refill  Phone Note Other Incoming   Summary of Call: patient has appt to see Dr Alwyn Ren on 08/05/2010 at 11:15---DR Alwyn Ren says he wants to see patient before refilling meds as they will probably be adjusted Initial call taken by: Jerolyn Shin,  July 31, 2010 2:34 PM

## 2010-10-31 NOTE — Progress Notes (Signed)
Summary: change direction on med  Phone Note Refill Request Call back at [phone] Message from:  Pharmacy  Refills Requested: Medication #1:  AMLODIPINE BESYLATE 2.5 MG TABS 1 by mouth two times a day pharmacy would like to change med to amlodipine 5mg  once daily instead of the two times a day dosing. Ref #[id number]....................Marland KitchenFelecia Deloach CMA  Feb 22, 2010 3:35 PM   Initial call taken by: Jeremy Johann CMA  Feb 22, 2010 3:35 PM  Caller: [facility] Initial call taken by: Jeremy Johann CMA,  Feb 22, 2010 3:34 PM  Follow-up for Phone Call        OK ; BP goal = AVERAGE < 135/85 ON AVERAGE Follow-up by: Marga Melnick MD,  Feb 22, 2010 3:48 PM  Additional Follow-up for Phone Call Additional follow up Details #1::        pharmacy aware.............Marland KitchenFelecia Deloach CMA  Feb 22, 2010 4:38 PM     New/Updated Medications: AMLODIPINE BESYLATE 5 MG TABS (AMLODIPINE BESYLATE) Take 1 tab  once daily** BP AVERAGE < 135/85** Prescriptions: AMLODIPINE BESYLATE 5 MG TABS (AMLODIPINE BESYLATE) Take 1 tab  once daily** BP AVERAGE < 135/85**  #90 x 3   Entered by:   Jeremy Johann CMA   Authorized by:   Marga Melnick MD   Signed by:   Jeremy Johann CMA on 02/22/2010   Method used:   Historical   RxID:   [id number]

## 2010-10-31 NOTE — Assessment & Plan Note (Signed)
Summary: irregular heart rate//fd   Vital Signs:  Patient profile:   77 year old male Weight:      192.4 pounds Temp:     98.1 degrees F oral Pulse rate:   96 / minute Pulse rhythm:   irregular BP sitting:   126 / 70  (left arm) Cuff size:   large  Vitals Entered By: Shonna Chock (January 14, 2010 4:10 PM) CC: Irregular Heartbeat, Palpitations Comments REVIEWED MED LIST, PATIENT AGREED DOSE AND INSTRUCTION CORRECT    CC:  Irregular Heartbeat and Palpitations.  History of Present Illness: Physical Therapist noted irregular heart beat; this was confirmed by [facility] Nurse. The patient denies palpitations,dizziness, presyncope, syncope, chest pain, shortness of breath, or  throat tightness.  He  has had slight intermittent diaphoresis; nausea 04/17 upon awakening; anorexia and weight loss of 10 # since discharge.  The patient denies the following symptoms: blurred vision, numbness, weakness, shortness of breath, and heat intolerance.  He has not had any panic attacks in past 8 days. On Amlodipine 2.5 mg X 3 days & off Metoprolol , BP 134-165/57-88.P 51 on Beta blocker  -  this am 108. Beta blocker was weaned & D/Ced due to reported hypotension & bradycardia (to 52) ; see Phone Notes .  Allergies: 1)  ! * Metformin 1000mg   Review of Systems General:  Complains of fatigue and sleep disorder; denies chills, fever, and sweats; Goes to sleep easily @ 11 pm  but awakens about 1 am. Eyes:  Denies double vision and vision loss-both eyes. ENT:  Denies difficulty swallowing and hoarseness. GI:  Complains of constipation; denies diarrhea; Metamucil didn't help. Derm:  Denies changes in nail beds, dryness, and hair loss. Neuro:  Denies brief paralysis, disturbances in coordination, inability to speak, numbness, poor balance, tingling, and weakness; PT released him today. Psych:  Denies anxiety, depression, easily angered, easily tearful, and irritability. Endo:  Denies cold intolerance.  Physical  Exam  General:  Thin but well-nourished,in no acute distress; alert,appropriate and cooperative throughout examination Eyes:  No corneal or conjunctival inflammation noted.Perrla. No lid lag Neck:  No deformities, masses, or tenderness noted. Lungs:  Normal respiratory effort, chest expands symmetrically. Lungs are clear to auscultation, no crackles or wheezes. Heart:  Normal rate and regular rhythm. S1 and S2 normal without gallop, murmur, click, rub.S4 Extremities:  No clubbing, cyanosis, edema.  Neurologic:  alert & oriented X3 and DTRs symmetrical and 0-1/2+. Fine tremor LUE > RUE Skin:  Intact without suspicious lesions or rashes Psych:  memory intact for recent and remote and slightly anxious.     Impression & Recommendations:  Problem # 1:  IRREGULAR HEART RATE (ICD-427.9) NSR on EKG with NS ST-T changes His updated medication list for this problem includes:    Aspirin 325 Mg Tabs (Aspirin) .Marland Kitchen... 1 by mouth once daily  Problem # 2:  UNSPECIFIED ESSENTIAL HYPERTENSION (ICD-401.9)  His updated medication list for this problem includes:    Lisinopril 40 Mg Tabs (Lisinopril) .Marland Kitchen... 1 by mouth qd    Amlodipine Besylate 2.5 Mg Tabs (Amlodipine besylate) .Marland Kitchen... Take 1 tab once daily  Problem # 3:  CEREBROVASCULAR ACCIDENT, HX OF (ICD-V12.50)  Complete Medication List: 1)  Glimepiride 2 Mg Tabs (Glimepiride) .... 1/2 tab qd 2)  Lisinopril 40 Mg Tabs (Lisinopril) .Marland Kitchen.. 1 by mouth qd 3)  Actoplus Met 15-850 Mg Tabs (Pioglitazone hcl-metformin hcl) .Marland Kitchen.. 1 by mouth once daily 4)  Tramadol Hcl 50 Mg Tabs (Tramadol hcl) .Marland Kitchen.. 1 q  6 hrs as needed pain 5)  Simvastatin 20 Mg Tabs (Simvastatin) .Marland Kitchen.. 1 by mouth at bedtime 6)  Amlodipine Besylate 2.5 Mg Tabs (Amlodipine besylate) .... Take 1 tab once daily 7)  Aspirin 325 Mg Tabs (Aspirin) .Marland Kitchen.. 1 by mouth once daily 8)  Vitamin E 400 Unit Caps (Vitamin e) .Marland Kitchen.. 1 by mouth once daily 9)  Fish Oil 1000 Mg Caps (Omega-3 fatty acids) .Marland Kitchen.. 1 by mouth  once daily 10)  Icaps Caps (Multiple vitamins-minerals) .Marland Kitchen.. 1 by mouth once daily 11)  Lorazepam 0.5 Mg Tabs (Lorazepam) .Marland Kitchen.. 1 every 6-8 as needed stress 12)  Paroxetine Hcl 20 Mg Tabs (Paroxetine hcl) .Marland Kitchen.. 1 at bedtime  Patient Instructions: 1)  Increase Amlodipine to 2.5 mg two times a day ; BP goal = < 140/90 ON  AVERAGE. Take Lorazepam if you awaken & can't go back to sleep.

## 2010-10-31 NOTE — Progress Notes (Signed)
Summary: refill  Phone Note Refill Request Message from:  Fax from Pharmacy on July 09, 2010 11:01 AM  Refills Requested: Medication #1:  LISINOPRIL 40 MG TABS 1 by mouth qd Jocelyn Lamer fax [phone]  Initial call taken by: Okey Regal Spring,  July 09, 2010 11:02 AM  Follow-up for Phone Call        Rx was faxed to [phone]  Follow-up by: Shonna Chock CMA,  July 09, 2010 11:14 AM    Prescriptions: LISINOPRIL 40 MG TABS (LISINOPRIL) 1 by mouth qd  #90 x 2   Entered by:   Shonna Chock CMA   Authorized by:   Marga Melnick MD   Signed by:   Shonna Chock CMA on 07/09/2010   Method used:   Print then Give to Patient   RxID:   [id number]   Appended Document: refill     Prescriptions: LISINOPRIL 40 MG TABS (LISINOPRIL) 1 by mouth qd  #90 x 2   Entered by:   Shonna Chock CMA   Authorized by:   Marga Melnick MD   Signed by:   Shonna Chock CMA on 07/23/2010   Method used:   Printed then faxed to ...         RxID:   [id number]    Patient called to indicate rx never received by [facility], will resend./[facility] CMA  July 23, 2010 2:33 PM

## 2010-10-31 NOTE — Letter (Signed)
Summary: Patient Notice- Polyp Results  [facility]  [address] [place], [place] [address]   Phone: [phone]  Fax: [phone]        March 25, 2010 MRN: [id number]    Brett Franklin [address] [place], [place]  [address]    Dear Mr. CARAVEO,  I am pleased to inform you that the colon polyp(s) removed during your recent colonoscopy was (were) found to be benign (no cancer detected) upon pathologic examination.  Should you develop new or worsening symptoms of abdominal pain, bowel habit changes or bleeding from the rectum or bowels, please schedule an evaluation with either your primary care physician or with me.  Continue treatment plan as outlined the day of your exam.  Please call us if you are having persistent problems or have questions about your condition that have not been fully answered at this time.  Sincerely,  Meryl Dare MD [facility]  This letter has been electronically signed by your physician.  Appended Document: Patient Notice- Polyp Results letter mailed 7.6.11

## 2010-10-31 NOTE — Letter (Signed)
Summary: Colonoscopy Letter  [facility]  [address] [place], [place] [address]   Phone: [phone]  Fax: [phone]      November 06, 2009 MRN: [id number]   Brett Franklin [address] [place], [place]  [address]   Dear Mr. [name],   According to your medical record, it is time for you to schedule a Colonoscopy. The American Cancer Society recommends this procedure as a method to detect early colon cancer. Patients with a family history of colon cancer, or a personal history of colon polyps or inflammatory bowel disease are at increased risk.  This letter has beeen generated based on the recommendations made at the time of your procedure. If you feel that in your particular situation this may no longer apply, please contact our office.  Please call our office at [phone] to schedule this appointment or to update your records at your earliest convenience.  Thank you for cooperating with [place] to provide you with the very best care possible.   Sincerely,  Judie Petit T. Russella Dar, M.D.  [facility] [phone]

## 2010-10-31 NOTE — Procedures (Signed)
Summary: [facility]   Imported By: Sherian Rein 01/08/2010 09:31:15  _____________________________________________________________________  External Attachment:    Type:   Image     Comment:   External Document

## 2010-10-31 NOTE — Assessment & Plan Note (Signed)
Summary: FOLLOW UP AND LABS TO DISCUSS//PH   Vital Signs:  Patient profile:   77 year old male Weight:      198.8 pounds BMI:     29.25 Temp:     97.6 degrees F oral Pulse rate:   72 / minute Resp:     15 per minute BP sitting:   110 / 68  (left arm) Cuff size:   large  Vitals Entered By: Shonna Chock CMA (August 05, 2010 11:57 AM)  Primary Care Provider:  Marga Melnick,  MD  CC:  Cough and Type 2 diabetes mellitus follow-up.  History of Present Illness: Cough      This is an 77 year old man who presents with Cough intermittently , on aveage 2x/month.  The patient reports minimally  productive cough, but denies shortness of breath, wheezing, exertional dyspnea, fever, and hemoptysis.  The patient denies the following symptoms: cold/URI symptoms, sore throat, nasal congestion, chronic rhinitis, acid reflux symptoms, and peripheral edema.  Effective prior treatments have included Rx  cough medication.  He is on ACE-I but he denies daily cough or angioedema. Hypertension Follow-Up      The patient also presents for Hypertension follow-up.  The patient denies lightheadedness, urinary frequency, headaches, edema, and fatigue.  The patient denies the following associated symptoms: palpitations and syncope.  Compliance with medications (by patient report) has been near 100%.  Adjunctive measures currently used by the patient include salt restriction.  BP averages 110/66-70 @ home. Type 2 Diabetes Mellitus Follow-Up      The patient is also here for Type 2 diabetes mellitus follow-up.  The patient reports  rare self managed hypoglycemia and weight gain of several # only, but denies polyuria, polydipsia, and numbness of extremities.  The patient denies the following symptoms: neuropathic pain, vomiting, orthostatic symptoms, poor wound healing, intermittent claudication, vision loss, and foot ulcer.  Since the last visit the patient reports good dietary compliance and exercising regularly.  The  patient has been measuring capillary blood glucose before breakfast,average 126.  Since the last visit, the patient reports having had eye care by an ophthalmologist  but  no foot care.  A1c was 6.7% in 06/2010. Hyperlipidemia Follow-Up      The patient also presents for Hyperlipidemia follow-up.  The patient reports constipation, but denies muscle aches, abdominal pain, and diarrhea.  Compliance with medications (by patient report) has been near 100%.  Adjunctive measures currently used by the patient include ASA.  LDL was 41.  Current Medications (verified): 1)  Glimepiride 2 Mg Tabs (Glimepiride) .... 1/2 Tab Qd 2)  Lisinopril 40 Mg Tabs (Lisinopril) .Marland Kitchen.. 1 By Mouth Qd 3)  Actoplus Met 15-850 Mg  Tabs (Pioglitazone Hcl-Metformin Hcl) .Marland Kitchen.. 1 By Mouth Once Daily 4)  Simvastatin 10 Mg Tabs (Simvastatin) .Marland Kitchen.. 1 At Bedtime 5)  Amlodipine Besylate 5 Mg Tabs (Amlodipine Besylate) .... Take 1 Tab By Month Daily ** Bp Average < 135/85** 6)  Aspirin 325 Mg Tabs (Aspirin) .Marland Kitchen.. 1 By Mouth Once Daily 7)  Omeprazole 20 Mg  Cpdr (Omeprazole) .Marland Kitchen.. 1 Each Day 30 Minutes Before Meal 8)  Tramadol Hcl 50 Mg Tabs (Tramadol Hcl) .Marland Kitchen.. 1 By Mouth Every 6 Hours As Needed  Allergies: 1)  ! * Metformin 1000mg   Review of Systems General:  Complains of sleep disorder; Oral  prosthesis  recommended by Dr Marylou Flesher.  Physical Exam  General:  well-nourished,in no acute distress; alert,appropriate and cooperative throughout examination Ears:  External ear exam  shows no significant lesions or deformities.  Otoscopic examination reveals clear canals, tympanic membranes are intact bilaterally without bulging, retraction, inflammation or discharge. Hearing is grossly normal bilaterally.Some wax on L Nose:  External nasal examination shows no deformity or inflammation. Nasal mucosa are pink and moist without lesions or exudates. Mouth:  Oral mucosa and oropharynx without lesions or exudates.  Teeth in good repair. Lungs:   Normal respiratory effort, chest expands symmetrically. Lungs are clear to auscultation, no crackles or wheezes. Heart:  Normal rate and regular rhythm. S1 and S2 normal without gallop, murmur, click, rub .S 4 Pulses:  R and L carotid,radial,dorsalis pedis and posterior tibial pulses are full and equal bilaterally Extremities:  No clubbing, cyanosis, edema. Minor toenail changes or deformity noted . Neurologic:  alert & oriented X3.   Skin:  Intact without suspicious lesions or rashes Cervical Nodes:  No lymphadenopathy noted Axillary Nodes:  No palpable lymphadenopathy Psych:  memory intact for recent and remote, normally interactive, and good eye contact.     Impression & Recommendations:  Problem # 1:  COUGH (ICD-786.2) intermittent mild bronchitis; ACE-I induced cough not suggested  Problem # 2:  DIABETES MELLITUS, TYPE II, CONTROLLED (ICD-250.00)  The following medications were removed from the medication list:    Actoplus Met 15-850 Mg Tabs (Pioglitazone hcl-metformin hcl) .Marland Kitchen... 1 by mouth once daily His updated medication list for this problem includes:    Glimepiride 2 Mg Tabs (Glimepiride) .Marland Kitchen... 1/2 tab qd    Lisinopril 40 Mg Tabs (Lisinopril) .Marland Kitchen... 1 by mouth qd    Aspirin 325 Mg Tabs (Aspirin) .Marland Kitchen... 1 by mouth once daily    Janumet 50-1000 Mg Tabs (Sitagliptin-metformin hcl) .Marland Kitchen... 1 two times a day with 2 largest meals  Problem # 3:  HYPERTENSION (ICD-401.9) controlled His updated medication list for this problem includes:    Lisinopril 40 Mg Tabs (Lisinopril) .Marland Kitchen... 1 by mouth qd    Amlodipine Besylate 5 Mg Tabs (Amlodipine besylate) .Marland Kitchen... Take 1 tab by month daily ** bp average < 135/85**  Complete Medication List: 1)  Glimepiride 2 Mg Tabs (Glimepiride) .... 1/2 tab qd 2)  Lisinopril 40 Mg Tabs (Lisinopril) .Marland Kitchen.. 1 by mouth qd 3)  Simvastatin 10 Mg Tabs (simvastatin)  .Marland Kitchen.. 1 at bedtime except 1/2 tues, th & sat 4)  Amlodipine Besylate 5 Mg Tabs (Amlodipine besylate)  .... Take 1 tab by month daily ** bp average < 135/85** 5)  Aspirin 325 Mg Tabs (Aspirin) .Marland Kitchen.. 1 by mouth once daily 6)  Omeprazole 20 Mg Cpdr (Omeprazole) .Marland Kitchen.. 1 each day 30 minutes before meal 7)  Tramadol Hcl 50 Mg Tabs (Tramadol hcl) .Marland Kitchen.. 1 by mouth every 6 hours as needed 8)  Hydromet 5-1.5 Mg/2ml Syrp (Hydrocodone-homatropine) .Marland Kitchen.. 1 tsp every 6 hrs as needed 9)  Janumet 50-1000 Mg Tabs (Sitagliptin-metformin hcl) .Marland Kitchen.. 1 two times a day with 2 largest meals  Other Orders: Flu Vaccine 32yrs + MEDICARE PATIENTS (Z3086) Administration Flu vaccine - MCR (V7846)  Patient Instructions: 1)  Please schedule a follow-up appointment in 3 months. 2)  BUN, creat, K+ prior to visit, ICD-9:401.9 3)  Lipid Panel prior to visit, ICD-9:250.00 4)  HbgA1C prior to visit, ICD-9:250.00 5)  Urine Microalbumin prior to visit, ICD-9:250.00 Prescriptions: JANUMET 50-1000 MG TABS (SITAGLIPTIN-METFORMIN HCL) 1 two times a day with 2 largest meals  #60 x 2   Entered and Authorized by:   Marga Melnick MD   Signed by:   Marga Melnick MD on 08/05/2010  Method used:   Print then Give to Patient   RxID:   [id number] HYDROMET 5-1.5 MG/5ML SYRP (HYDROCODONE-HOMATROPINE) 1 tsp every 6 hrs as needed  #120 cc x 0   Entered and Authorized by:   Marga Melnick MD   Signed by:   Marga Melnick MD on 08/05/2010   Method used:   Print then Give to Patient   RxID:   [id number]    Orders Added: 1)  Flu Vaccine 64yrs + MEDICARE PATIENTS [Q2039] 2)  Administration Flu vaccine - MCR [G0008] 3)  Est. Patient Level IV [95284] Flu Vaccine Consent Questions     Do you have a history of severe allergic reactions to this vaccine? no    Any prior history of allergic reactions to egg and/or gelatin? no    Do you have a sensitivity to the preservative Thimersol? no    Do you have a past history of Guillan-Barre Syndrome? no    Do you currently have an acute febrile illness? no    Have you ever had a severe  reaction to latex? no    Vaccine information given and explained to patient? yes    Are you currently pregnant? no    Lot Number:[id number]   Exp Date:03/29/2011   Site Given  Left Deltoid IM Vaccine 65yrs + MEDICARE PATIENTS [Q2039] 2)  Administration Flu vaccine - MCR [G0008]     .lbmedflu

## 2010-10-31 NOTE — Miscellaneous (Signed)
Summary: SN Orders/[facility]  SN Orders/[facility]   Imported By: Lanelle Bal 03/07/2010 14:10:52  _____________________________________________________________________  External Attachment:    Type:   Image     Comment:   External Document

## 2010-10-31 NOTE — Procedures (Signed)
Summary: EGD and biopsy   EGD  Procedure date:  02/09/2006  Findings:      Findings: Gastritis  Location: [facility]   Patient Name: Brett Franklin, Brett Franklin MRN:  Procedure Procedures: Panendoscopy (EGD) CPT: 43235.    with biopsy(s)/brushing(s). CPT: D1846139.  Personnel: Endoscopist: Venita Lick. Russella Dar, MD, [facility] Graham.  Exam Location: Exam performed in Outpatient Clinic. Outpatient  Patient Consent: Procedure, Alternatives, Risks and Benefits discussed, consent obtained, from patient. Consent was obtained by the RN.  Indications  Evaluation of: Positive fecal occult blood test  Symptoms: Reflux symptoms  History  Current Medications: Patient is not currently taking Coumadin.  Pre-Exam Physical: Performed Feb 09, 2006  Cardio-pulmonary exam, HEENT exam, Abdominal exam, Mental status exam WNL.  Comments: Pt. history reviewed/updated, physical exam performed prior to initiation of sedation?Yes Exam Exam Info: Maximum depth of insertion Duodenum, intended Duodenum. Vocal cords not visualized. Gastric retroflexion performed. ASA Classification: II. Tolerance: excellent.  Sedation Meds: Patient assessed and found to be appropriate for moderate (conscious) sedation. Residual sedation present from prior procedure today. Cetacaine Spray 2 sprays given aerosolized. Versed 1 mg. given IV. Fentanyl 75 mcg. given IV.  Monitoring: BP and pulse monitoring done. Oximetry used. Supplemental O2 given  Findings Normal: Proximal Esophagus to Distal Esophagus.  MUCOSAL ABNORMALITY: Cardia to Pyloric Sphincter. Erythematous mucosa. Granular mucosa. Biopsy/Mucosal Abn taken. ICD9: Gastritis, Unspecified: 535.50.  Normal: Duodenal Bulb to Duodenal 2nd Portion.   Assessment  Diagnoses: 535.50: Gastritis, Unspecified.   Events  Unplanned Intervention: No unplanned interventions were required.  Unplanned Events: There were no complications. Plans Medication(s): Await  pathology. PPI: QAM,   Patient Education: Patient given standard instructions for: Reflux. Mucosal Abnormality.  Disposition: After procedure patient sent to recovery. After recovery patient sent home.  Scheduling: Office Visit, to [employer]. Russella Dar, MD, [facility], around Mar 12, 2006.    This report was created from the original endoscopy report, which was reviewed and signed by the above listed endoscopist.    cc: Titus Dubin. Alwyn Ren, MD        SP Surgical Pathology - STATUS: Final             By: Gwenlyn Saran  ,        Perform Date: [id number] 00:01  Ordered By: Rica Records Date: [phone] 14:02  Facility: [facility]                               Department: CPATH  Service Report Text  [facility]   [address] [address]   [place], [place] [address]   Telephone [phone] or [phone] Fax [phone]    REPORT OF SURGICAL PATHOLOGY    Case #: [id number]   Patient Name: Brett Franklin, Brett Franklin.   Office Chart Number: N/A    MRN: [id number]   Pathologist: Berneta Levins, MD   DOB/Age [birth year] (Age: 77) Gender: M   Date Taken: 02/09/2006   Date Received: 02/10/2006    FINAL DIAGNOSIS    ***MICROSCOPIC EXAMINATION AND DIAGNOSIS***    STOMACH: BENIGN GASTRIC MUCOSA. NO HELICOBACTER PYLORI,   INTESTINAL METAPLASIA OR ACTIVE INFLAMMATION IDENTIFIED. (BIOPSY)    COMMENT   A Warthin-Starry stain is performed to determine the possibility   of the presence of Helicobacter pylori. The Warthin-Starry stain   is negative for organisms of Helicobacter pylori. The control(s)  stained appropriately. There are fragments of benign fundic-type   and gastric antral/cardia-type mucosa without inflammation,   intestinal metaplasia or evidence of malignancy. (JAS:kv   02-11-06)    kv   Date Reported: 02/11/2006 Berneta Levins, MD   *** Electronically Signed Out By JAS ***    Clinical information   Screening; R/O gastritis (caf)    specimen(s)  obtained   Stomach, biopsy    Gross Description   Received in formalin are tan, soft tissue fragments that are   submitted in toto. Number: four.   Size:0.1 to 0.3 cm TB:mw 02/10/06    mw/

## 2010-10-31 NOTE — Letter (Signed)
Summary: New Patient letter  [facility]  [address] [place], [place] [address]   Phone: [phone]  Fax: [phone]       12/10/2009 MRN: [id number]  Brett Franklin [address] [place], [place]  [address]  Dear Brett Franklin,  Welcome to the Gastroenterology Division at [facility].    You are scheduled to see Dr. Claudette Head on 01-07-2010 at 2:30pm on the 3rd floor at [facility], [address]  We ask that you try to arrive at our office 15 minutes prior to your appointment time to allow for check-in.  We would like you to complete the enclosed self-administered evaluation form prior to your visit and bring it with you on the day of your appointment.  We will review it with you.  Also, please bring a complete list of all your medications or, if you prefer, bring the medication bottles and we will list them.  Please bring your insurance card so that we may make a copy of it.  If your insurance requires a referral to see a specialist, please bring your referral form from your primary care physician.  Co-payments are due at the time of your visit and may be paid by cash, check or credit card.     Your office visit will consist of a consult with your physician (includes a physical exam), any laboratory testing he/she may order, scheduling of any necessary diagnostic testing (e.g. x-ray, ultrasound, CT-scan), and scheduling of a procedure (e.g. Endoscopy, Colonoscopy) if required.  Please allow enough time on your schedule to allow for any/all of these possibilities.    If you cannot keep your appointment, please call [phone] to cancel or reschedule prior to your appointment date.  This allows [place] the opportunity to schedule an appointment for another patient in need of care.  If you do not cancel or reschedule by 5 p.m. the business day prior to your appointment date, you will be charged a $50.00 late cancellation/no-show fee.    Thank you for  choosing [facility] for your medical needs.  We appreciate the opportunity to care for you.  Please visit [place] at our website  to learn more about our practice.                     Sincerely,                                                             The Gastroenterology Division

## 2010-10-31 NOTE — Assessment & Plan Note (Signed)
Summary: cold and cough for a week///sph   Vital Signs:  Patient profile:   77 year old male Weight:      189.2 pounds BMI:     27.84 Temp:     97.9 degrees F oral Pulse rate:   76 / minute Resp:     14 per minute BP sitting:   118 / 80  (left arm) Cuff size:   large  Vitals Entered By: Shonna Chock CMA (October 14, 2010 3:42 PM) CC: Cold x 1 week , URI symptoms   Primary Care Provider:  Marga Melnick,  MD  CC:  Cold x 1 week  and URI symptoms.  History of Present Illness:      This is an 77 year old man who presents with RTI  symptoms; onset 1 week ago as ST.  The patient  now reports productive cough with white sputum, but denies purulent nasal discharge, sore throat, and earache.  The patient denies fever, dyspnea, and wheezing.  The patient denies  frontal headache.  The patient denies the following risk factors for Strep sinusitis: unilateral facial pain, tooth pain, Strep exposure, and tender adenopathy. Rx: cough syrup . DM is well controlled.   Allergies: 1)  ! * Metformin 1000mg   Physical Exam  General:  well-nourished,in no acute distress; alert,appropriate and cooperative throughout examination Ears:  External ear exam shows no significant lesions or deformities.  Otoscopic examination reveals clear canals, tympanic membranes are intact bilaterally without bulging, retraction, inflammation or discharge. Hearing is grossly normal bilaterally. Nose:  External nasal examination shows no deformity or inflammation. Nasal mucosa are pink and moist without lesions or exudates. Mouth:  Oral mucosa and oropharynx without lesions or exudates.  Teeth in good repair. Lungs:  Normal respiratory effort, chest expands symmetrically. Lungs are clear to auscultation, no crackles or wheezes. Cervical Nodes:  No lymphadenopathy noted Axillary Nodes:  No palpable lymphadenopathy   Impression & Recommendations:  Problem # 1:  BRONCHITIS-ACUTE (ICD-466.0)  His updated medication list  for this problem includes:    Hydromet 5-1.5 Mg/51ml Syrp (Hydrocodone-homatropine) .Marland Kitchen... 1 tsp every 6 hrs as needed    Azithromycin 250 Mg Tabs (Azithromycin) .Marland Kitchen... As per pack  Orders: Prescription Created Electronically [id number])  Complete Medication List: 1)  Lisinopril 40 Mg Tabs (Lisinopril) .Marland Kitchen.. 1 by mouth qd 2)  Simvastatin 10 Mg Tabs (simvastatin)  .Marland Kitchen.. 1 at bedtime except 1/2 tues, th & sat 3)  Amlodipine Besylate 5 Mg Tabs (Amlodipine besylate) .... Take 1 tab by month daily ** bp average < 135/85** 4)  Aspirin 325 Mg Tabs (Aspirin) .Marland Kitchen.. 1 by mouth once daily 5)  Omeprazole 20 Mg Cpdr (Omeprazole) .Marland Kitchen.. 1 each day 30 minutes before  b'fast and eve meal 6)  Tramadol Hcl 50 Mg Tabs (Tramadol hcl) .Marland Kitchen.. 1 by mouth every 6 hours as needed 7)  Hydromet 5-1.5 Mg/52ml Syrp (Hydrocodone-homatropine) .Marland Kitchen.. 1 tsp every 6 hrs as needed 8)  Janumet 50-1000 Mg Tabs (Sitagliptin-metformin hcl) .Marland Kitchen.. 1 two times a day with 2 largest meals 9)  Azithromycin 250 Mg Tabs (Azithromycin) .... As per pack  Patient Instructions: 1)  Drink as much  NON dairy fluid as you can tolerate for the next few days. Prescriptions: AZITHROMYCIN 250 MG TABS (AZITHROMYCIN) as per pack  #1 x 0   Entered and Authorized by:   Marga Melnick MD   Signed by:   Marga Melnick MD on 10/14/2010   Method used:   Electronically to  [facility] [address]* (retail)       [address]       [place], [place]  [address]       Ph: [phone]       Fax: [phone]   RxID:   [id number]    Orders Added: 1)  Est. Patient Level III [84132] 2)  Prescription Created Electronically [phone]

## 2010-10-31 NOTE — Procedures (Signed)
Summary: Upper Endoscopy w/DIL  Patient: Brett Franklin Note: All result statuses are Final unless otherwise noted.  Tests: (1) Upper Endoscopy w/DIL (UED)  UED Upper Endoscopy w/DIL                             DONE     [facility]     [address]     [place], [place]  [address]           ENDOSCOPY PROCEDURE REPORT           PATIENT:  [name], [name]  MR#:  [id number]     BIRTHDATE:  1925-11-08, 84 yrs. old  GENDER:  male     ENDOSCOPIST:  Judie Petit T. Russella Dar, MD, [facility]           PROCEDURE DATE:  03/20/2010     PROCEDURE:  EGD with dilatation over guidewire, EGD with biopsy     ASA CLASS:  Class II     INDICATIONS:  1) dysphagia     MEDICATIONS:  There was residual sedation effect present from     prior procedure.     TOPICAL ANESTHETIC:  Exactacain Spray     DESCRIPTION OF PROCEDURE:   After the risks benefits and     alternatives of the procedure were thoroughly explained, informed     consent was obtained.  The [id number] endoscope was     introduced through the mouth and advanced to the second portion of     the duodenum, without limitations.  The instrument was slowly     withdrawn as the mucosa was carefully examined.     <<PROCEDUREIMAGES>>     Multiple erosions were found in the bulb of the duodenum and in     the descending duodenum.  Moderate gastritis was found in the     antrum. It was erythematous. Multiple biopsies were obtained and     sent to pathology.  The esophagus and gastroesophageal junction     were completely normal in appearance.  Rhe examination was     otherwise normal.  Dilation was then performed at the total     esophagus for dysphagia without a stricture:           1) Dilator:  Savary over guidewire  Size:  17     Resistance:  none  Heme:  none           COMPLICATIONS:  None           ENDOSCOPIC IMPRESSION:     1) Erosions, multiple in the  duodenum     2) Antral gastritis           RECOMMENDATIONS:     1) await pathology  results     2) PPI qam long term while on ASA: omeprazole 20 mg po qam, #30,     11 refills     3) post dilation instructions     4) suspect ASA induced gastroduodenitis, R/O H. pylori     5) if dysphagia persists proceed with MBS           Gurman Ashland T. Russella Dar, MD, [facility] Graham           CC:  Marga Melnick, M.D.           n.     eSIGNED:   Venita Lick. Alfonzo Arca at 03/20/2010 04:20 PM  Barrie Lyme, [id number]  Note: An exclamation mark (!) indicates a result that was not dispersed into the flowsheet. Document Creation Date: 03/20/2010 4:20 PM _______________________________________________________________________  (1) Order result status: Final Collection or observation date-time: 03/20/2010 16:09 Requested date-time:  Receipt date-time:  Reported date-time:  Referring Physician:   Ordering Physician: Claudette Head [phone]) Specimen Source:  Source: Launa Grill Order Number: [id number] Lab site:

## 2010-10-31 NOTE — Assessment & Plan Note (Signed)
Summary: ONE MTH FU/KDC   Vital Signs:  Patient profile:   77 year old male Weight:      190.6 pounds Pulse rate:   80 / minute Resp:     15 per minute BP sitting:   124 / 76  (left arm) Cuff size:   large  Vitals Entered By: Shonna Chock (Feb 07, 2010 8:59 AM) CC: One month follow-up: Refill Meds, Hypertension Management Comments REVIEWED MED LIST, PATIENT AGREED DOSE AND INSTRUCTION CORRECT    CC:  One month follow-up: Refill Meds and Hypertension Management.  History of Present Illness: BP @ home 120-146/57-86. FBS 128-176; no hypoglycemia. Weight down 10-12 # post CVA.  Hypertension History:      He denies headache, chest pain, palpitations, dyspnea with exertion, orthopnea, PND, peripheral edema, visual symptoms, neurologic problems, syncope, and side effects from treatment.        Positive major cardiovascular risk factors include male age 19 years old or older, diabetes, and hypertension.  Negative major cardiovascular risk factors include non-tobacco-user status.     Allergies: 1)  ! * Metformin 1000mg   Review of Systems Eyes:  Denies blurring, double vision, and vision loss-both eyes. CV:  Denies leg cramps with exertion. Neuro:  Denies brief paralysis, difficulty with concentration, disturbances in coordination, numbness, poor balance, tingling, and weakness.  Physical Exam  General:  Thin , appears younger than age,well-nourished; alert,appropriate and cooperative throughout examination Lungs:  Normal respiratory effort, chest expands symmetrically. Lungs are clear to auscultation, no crackles or wheezes. Heart:  Normal rate and regular rhythm. S1 and S2 normal without gallop,  click, rub.Grade 1/2 systolic murmur Pulses:  R and L carotid,radial,dorsalis pedis and posterior tibial pulses are full and equal bilaterally Extremities:  No clubbing, cyanosis, edema. Psych:  memory intact for recent and remote, normally interactive, and good eye contact.      Impression & Recommendations:  Problem # 1:  UNSPECIFIED ESSENTIAL HYPERTENSION (ICD-401.9)  His updated medication list for this problem includes:    Lisinopril 40 Mg Tabs (Lisinopril) .Marland Kitchen... 1 by mouth qd    Amlodipine Besylate 2.5 Mg Tabs (Amlodipine besylate) .Marland Kitchen... 1 by mouth two times a day  Orders: Venipuncture ([address]40) TLB-Creatinine, Blood (82565-CREA) TLB-Potassium (K+) (84132-K) TLB-BUN (Urea Nitrogen) (84520-BUN)  Problem # 2:  DIABETES MELLITUS, TYPE II, CONTROLLED (ICD-250.00)  His updated medication list for this problem includes:    Glimepiride 2 Mg Tabs (Glimepiride) .Marland Kitchen... 1/2 tab qd    Lisinopril 40 Mg Tabs (Lisinopril) .Marland Kitchen... 1 by mouth qd    Actoplus Met 15-850 Mg Tabs (Pioglitazone hcl-metformin hcl) .Marland Kitchen... 1 by mouth once daily    Aspirin 325 Mg Tabs (Aspirin) .Marland Kitchen... 1 by mouth once daily  Orders: Venipuncture (98119) TLB-Creatinine, Blood (82565-CREA) TLB-Potassium (K+) (84132-K) TLB-BUN (Urea Nitrogen) (84520-BUN) TLB-A1C / Hgb A1C (Glycohemoglobin) (83036-A1C) TLB-Microalbumin/Creat Ratio, Urine (82043-MALB)  Problem # 3:  CEREBROVASCULAR ACCIDENT, HX OF (ICD-V12.50)  Complete Medication List: 1)  Glimepiride 2 Mg Tabs (Glimepiride) .... 1/2 tab qd 2)  Lisinopril 40 Mg Tabs (Lisinopril) .Marland Kitchen.. 1 by mouth qd 3)  Actoplus Met 15-850 Mg Tabs (Pioglitazone hcl-metformin hcl) .Marland Kitchen.. 1 by mouth once daily 4)  Tramadol Hcl 50 Mg Tabs (Tramadol hcl) .Marland Kitchen.. 1 q 6 hrs as needed pain 5)  Simvastatin 20 Mg Tabs (Simvastatin) .Marland Kitchen.. 1 by mouth at bedtime 6)  Amlodipine Besylate 2.5 Mg Tabs (Amlodipine besylate) .Marland Kitchen.. 1 by mouth two times a day 7)  Aspirin 325 Mg Tabs (Aspirin) .Marland Kitchen.. 1 by mouth once  daily 8)  Vitamin E 400 Unit Caps (Vitamin e) .Marland Kitchen.. 1 by mouth once daily 9)  Fish Oil 1000 Mg Caps (Omega-3 fatty acids) .Marland Kitchen.. 1 by mouth once daily 10)  Icaps Caps (Multiple vitamins-minerals) .Marland Kitchen.. 1 by mouth once daily 11)  Lorazepam 0.5 Mg Tabs (Lorazepam) .Marland Kitchen.. 1 every 6-8 as  needed stress 12)  Paroxetine Hcl 20 Mg Tabs (Paroxetine hcl) .Marland Kitchen.. 1 at bedtime  Hypertension Assessment/Plan:      The patient's hypertensive risk group is category C: Target organ damage and/or diabetes.  His calculated 10 year risk of coronary heart disease is 14 %.  Today's blood pressure is 124/76.    Patient Instructions: 1)  Check your blood sugars regularly. If your readings are usually above :150  or below 90 you should contact our office. 2)  See your eye doctor yearly to check for diabetic eye damage. 3)  Check your feet each night for sore areas, calluses or signs of infection.Check your Blood Pressure regularly. If it is above:140/90 ON AVERAGE  you should make an appointment.Take an Aspirin every day. Prescriptions: PAROXETINE HCL 20 MG TABS (PAROXETINE HCL) 1 at bedtime  #30 x 0   Entered by:   Shonna Chock   Authorized by:   Marga Melnick MD   Signed by:   Shonna Chock on 02/07/2010   Method used:   Print then Give to Patient   RxID:   [id number]

## 2010-10-31 NOTE — Progress Notes (Signed)
Summary: irregular heart beat  Phone Note From Other Clinic   [facility] Summary of Call: Pt metoprolol  was d/c and pt current BP is 144/70. pt heart rate is irregular. Nruse states that she listen to heart rate for full 3 minutes and it was irregular with a little pause with some extra beats. Nurse recommends pt be seen earlier than 01-08-10 as pt had previously plan to f/u. pt coming in this afternoon. pt wife pt has not been feeling well but denies any numbness, tingling, or chest pain .Felecia Deloach CMA  January 14, 2010 12:55 PM

## 2010-10-31 NOTE — Progress Notes (Signed)
Summary: fyi fyi pt in hospital  Phone Note Call from Patient   Caller: Son Summary of Call: Pt son left VM stating that pt is in hospital at [facility] in room 1506. 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 Whether the pt actually suffer a stroke has yet to be confirmed but pt is exhibiting stoke like symptoms. Pt son would like for Dr Abbigayle Toole to give him a call at his own convenience to discuss what they need to do for pt. Per pt son they are not receiving a lot of direction from the staff at hospital. room [phone] cell # [phone]................Marland KitchenFelecia Deloach CMA  December 21, 2009 2:20 PM   Follow-up for Phone Call        hosp visit made 03/26; conference held with family Follow-up by: Marga Melnick MD,  December 23, 2009 8:30 AM

## 2010-10-31 NOTE — Procedures (Signed)
Summary: Colonoscopy  Patient: Brett Franklin Note: All result statuses are Final unless otherwise noted.  Tests: (1) Colonoscopy (COL)   COL Colonoscopy           DONE     [facility]     [address]     [place], [place]  [address]           COLONOSCOPY PROCEDURE REPORT           PATIENT:  Brett Franklin, Brett Franklin  MR#:  [id number]     BIRTHDATE:  05/25/1926, 84 yrs. old  GENDER:  male     ENDOSCOPIST:  Judie Petit T. Russella Dar, MD, [facility]           PROCEDURE DATE:  03/20/2010     PROCEDURE:  Colonoscopy with snare polypectomy     ASA CLASS:  Class II     INDICATIONS:  1) constipation  2) change in bowel habits  3)     follow-up of polyp, adenomatous polyp, 11/1996.     MEDICATIONS:   Fentanyl 100 mcg IV, Versed 10 mg IV     DESCRIPTION OF PROCEDURE:   After the risks benefits and     alternatives of the procedure were thoroughly explained, informed     consent was obtained.  Digital rectal exam was performed and     revealed no abnormalities.   The LB [id number] endoscope     was introduced through the anus and advanced to the cecum, which     was identified by both the appendix and ileocecal valve, without     limitations.  The quality of the prep was good, using MoviPrep.     The instrument was then slowly withdrawn as the colon was fully     examined.     <<PROCEDUREIMAGES>>     FINDINGS:  A sessile polyp was found in the distal transverse     colon. It was 7 mm in size. Polyp was snared without cautery.     Retrieval was successful. A normal appearing cecum, ileocecal     valve, and appendiceal orifice were identified. The ascending,     hepatic flexure, splenic flexure, descending, sigmoid colon, and     rectum appeared unremarkable. Retroflexed views in the rectum     revealed internal hemorrhoids, moderate.  The time to cecum =     3.25  minutes. The scope was then withdrawn (time =  9  min) from     the patient and the procedure completed.           COMPLICATIONS:   None           ENDOSCOPIC IMPRESSION:     1) 7 mm sessile polyp in the distal transverse colon     2) Internal hemorrhoids           RECOMMENDATIONS:     1) High fiber diet with liberal fluid intake.     2) Await pathology results     3) No plans for future surveillance colonoscopies due to age and     comorbidities     4) Constipation management per my 03/04/10 office note           Judie Petit T. Russella Dar, MD, [facility]           CC: Pecola Lawless, MD           n.     Rosalie DoctorVenita Lick. Ruthella Kirchman at 03/20/2010 04:14 PM  Barrie Lyme, [phone]  Note: An exclamation mark (!) indicates a result that was not dispersed into the flowsheet. Document Creation Date: 03/20/2010 4:14 PM _______________________________________________________________________  (1) Order result status: Final Collection or observation date-time: 03/20/2010 15:58 Requested date-time:  Receipt date-time:  Reported date-time:  Referring Physician:   Ordering Physician: Claudette Head [phone]) Specimen Source:  Source: Launa Grill Order Number: [id number] Lab site:

## 2010-10-31 NOTE — Letter (Signed)
Summary: [facility] Instructions  [facility]  [address] [place], [place] [address]   Phone: [phone]  Fax: [phone]       Brett Franklin    [birth year]    MRN: [id number]        Procedure Day Dorna Bloom: Wednesday June 22nd, 2011     Arrival Time: 2:30pm     Procedure Time: 3:30pm     Location of Procedure:                    _x _  [facility] (4th Floor)   PREPARATION FOR COLONOSCOPY WITH MOVIPREP   Starting 5 days prior to your procedure 03/15/10 do not eat nuts, seeds, popcorn, corn, beans, peas,  salads, or any raw vegetables.  Do not take any fiber supplements (e.g. Metamucil, Citrucel, and Benefiber).  THE DAY BEFORE YOUR PROCEDURE         DATE: 03/19/10  DAY: Tuesday  1.  Drink clear liquids the entire day-NO SOLID FOOD  2.  Do not drink anything colored red or purple.  Avoid juices with pulp.  No orange juice.  3.  Drink at least 64 oz. (8 glasses) of fluid/clear liquids during the day to prevent dehydration and help the prep work efficiently.  CLEAR LIQUIDS INCLUDE: Water Jello Ice Popsicles Tea (sugar ok, no milk/cream) Powdered fruit flavored drinks Coffee (sugar ok, no milk/cream) Gatorade Juice: apple, white grape, white cranberry  Lemonade Clear bullion, consomm, broth Carbonated beverages (any kind) Strained chicken noodle soup Hard Candy                             4.  In the morning, mix first dose of MoviPrep solution:    Empty 1 Pouch A and 1 Pouch B into the disposable container    Add lukewarm drinking water to the top line of the container. Mix to dissolve    Refrigerate (mixed solution should be used within 24 hrs)  5.  Begin drinking the prep at 5:00 p.m. The MoviPrep container is divided by 4 marks.   Every 15 minutes drink the solution down to the next mark (approximately 8 oz) until the full liter is complete.   6.  Follow completed prep with 16 oz of clear liquid of your choice (Nothing red or  purple).  Continue to drink clear liquids until bedtime.  7.  Before going to bed, mix second dose of MoviPrep solution:    Empty 1 Pouch A and 1 Pouch B into the disposable container    Add lukewarm drinking water to the top line of the container. Mix to dissolve    Refrigerate  THE DAY OF YOUR PROCEDURE      DATE: 03/20/10 DAY: Wednesday  Beginning at 10:30 a.m. (5 hours before procedure):         1. Every 15 minutes, drink the solution down to the next mark (approx 8 oz) until the full liter is complete.  2. Follow completed prep with 16 oz. of clear liquid of your choice.    3. You may drink clear liquids until 1:30pm (2 HOURS BEFORE PROCEDURE).   MEDICATION INSTRUCTIONS  Unless otherwise instructed, you should take regular prescription medications with a small sip of water   as early as possible the morning of your procedure.  Diabetic patients - see separate instructions.         OTHER INSTRUCTIONS  You will  need a responsible adult at least 75 years of age to accompany you and drive you home.   This person must remain in the waiting room during your procedure.  Wear loose fitting clothing that is easily removed.  Leave jewelry and other valuables at home.  However, you may wish to bring a book to read or  an iPod/MP3 player to listen to music as you wait for your procedure to start.  Remove all body piercing jewelry and leave at home.  Total time from sign-in until discharge is approximately 2-3 hours.  You should go home directly after your procedure and rest.  You can resume normal activities the  day after your procedure.  The day of your procedure you should not:   Drive   Make legal decisions   Operate machinery   Drink alcohol   Return to work  You will receive specific instructions about eating, activities and medications before you leave.    The above instructions have been reviewed and explained to me by    _______________________    I fully understand and can verbalize these instructions _____________________________ Date _________

## 2010-10-31 NOTE — Assessment & Plan Note (Signed)
Summary: consult before recall col age...em   History of Present Illness Visit Type: Initial Visit Primary GI MD: Elie Goody MD [facility] Primary Provider: Marga Melnick,  MD Chief Complaint: constipation, dysphagia, prior colon polyps  History of Present Illness:   This is an 77 year old white male with a history of adenomatous colon polyps. He relates a substantial change in bowel habits over the past 3 months with worsening constipation. His symptoms began in March following a CVA. He is maintained on aspirin 325 mg daily. He relates no residual neurologic deficits. He has had intermittent episodes of solid food dysphagia for the past few years. His last colonoscopy and upper endoscopy were performed in May 2007 and were only remarkable for mild gastritis and internal hemorrhoids.   GI Review of Systems    Reports dysphagia with solids.      Denies abdominal pain, acid reflux, belching, bloating, chest pain, dysphagia with liquids, heartburn, loss of appetite, nausea, vomiting, vomiting blood, weight loss, and  weight gain.      Reports constipation.     Denies anal fissure, black tarry stools, change in bowel habit, diarrhea, diverticulosis, fecal incontinence, heme positive stool, hemorrhoids, irritable bowel syndrome, jaundice, light color stool, liver problems, rectal bleeding, and  rectal pain.   Current Medications (verified): 1)  Glimepiride 2 Mg Tabs (Glimepiride) .... 1/2 Tab Qd 2)  Lisinopril 40 Mg Tabs (Lisinopril) .Marland Kitchen.. 1 By Mouth Qd 3)  Actoplus Met 15-850 Mg  Tabs (Pioglitazone Hcl-Metformin Hcl) .Marland Kitchen.. 1 By Mouth Once Daily 4)  Simvastatin 20 Mg Tabs (Simvastatin) .Marland Kitchen.. 1 By Mouth At Bedtime 5)  Amlodipine Besylate 5 Mg Tabs (Amlodipine Besylate) .... Take 1 Tab  Bid ** Bp Average < 135/85** 6)  Aspirin 325 Mg Tabs (Aspirin) .Marland Kitchen.. 1 By Mouth Once Daily 7)  Paroxetine Hcl 20 Mg Tabs (Paroxetine Hcl) .Marland Kitchen.. 1 At Bedtime  Allergies (verified): 1)  ! * Metformin 1000mg   Past  History:  Past Medical History: Reviewed history from 01/03/2010 and no changes required. 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 Cholecystectomy Renal calculi X2 , Dr Aldean Ast Colon polypectomy, Dr Russella Dar  Family History: Reviewed history from 11/16/2007 and no changes required. father died age 90 acute alcohol intox maternal grandfather MI No FH of Colon Cancer:  Social History: Reviewed history from 11/16/2007 and no changes required. Never Smoked Alcohol use-no Retired Daily Caffeine Use Illicit Drug Use - no Married 1 boy 1 girlDrug Use:  no  Review of Systems       The patient complains of hearing problems.         The pertinent positives and negatives are noted as above and in the HPI. All other ROS were reviewed and were negative.   Vital Signs:  Patient profile:   77 year old male Height:      69.25 inches Weight:      193 pounds BMI:     28.40 Pulse rate:   88 / minute Pulse rhythm:   regular BP sitting:   120 / 76  (left arm)  Vitals Entered By: Milford Cage NCMA (March 04, 2010 11:08 AM)  Physical Exam  General:  Well developed, well nourished, no acute distress. Head:  Normocephalic and atraumatic. Eyes:  PERRLA, no icterus. Ears:  Normal auditory acuity. Mouth:  No deformity or lesions, dentition normal. Neck:  Supple; no masses or thyromegaly. Lungs:  Clear throughout to auscultation. Heart:  Regular rate and rhythm; no murmurs, rubs,  or bruits. Abdomen:  Soft, nontender and nondistended. No masses, hepatosplenomegaly or hernias noted. Normal bowel sounds. Rectal:  deferred until time of colonoscopy.   Msk:  Symmetrical with no gross deformities. Normal  posture. Pulses:  Normal pulses noted. Extremities:  No clubbing, cyanosis, edema or deformities noted. Neurologic:  Alert and  oriented x4;  grossly normal neurologically. Cervical Nodes:  No significant cervical adenopathy. Axillary Nodes:  No significant axillary adenopathy. Psych:  Alert and cooperative. Normal mood and affect.  Impression & Recommendations:  Problem # 1:  UNSPECIFIED CONSTIPATION (ICD-564.00) New-onset constipation with a substantial change in bowel habits. Increase dietary fiber and fluid intake. Begin MiraLax once or twice daily, titrated for adequate bowel movements. Rule out colorectal lesions. The risks, benefits and alternatives to colonoscopy with possible biopsy and possible polypectomy were discussed with the patient and they consent to proceed. The procedure will be scheduled electively. He has had no apparent residual neurologic deficits following his CVA in March and is maintained on aspirin 325 mg daily and wiil remain on aspirin for the procedures. Orders: Endo Savary  Dil/Colon (Endo Sav Dil/Col)  Problem # 2:  COLONIC POLYPS, HX OF (ICD-V12.72) Personal history of adenomatous colon polyps, initially diagnosed in 1998. Colonoscopy as above. Orders: Endo Savary  Dil/Colon (Endo Sav Dil/Col)  Problem # 3:  OTHER DYSPHAGIA (ICD-787.29) Intermittent solid food dysphagia. Rule out esophageal strictures, motility disturbances, and other disorders. The risks, benefits and alternatives to endoscopy with possible biopsy and possible dilation were discussed with the patient and they consent to proceed. The procedure will be scheduled electively. If the EGD is unremarkable proceed with a barium esophagram with barium tablet. Orders: Endo Savary  Dil/Colon (Endo Sav Dil/Col)  Patient Instructions: 1)  Start Miralax 17 grams in 8 oz of water once to twice a day.  2)  Colonoscopy brochure given.  3)  Upper Endoscopy brochure given.  4)  Copy sent to : Marga Melnick,  MD 5)  The medication list was reviewed and reconciled.  All changed / newly prescribed medications were explained.  A complete medication list was provided to the patient / caregiver.  Prescriptions: MOVIPREP 100 GM  SOLR (PEG-KCL-NACL-NASULF-NA ASC-C) As per prep instructions.  #1 x 0   Entered by:   Christie Nottingham CMA (AAMA)   Authorized by:   Meryl Dare MD [facility]   Signed by:   Meryl Dare MD FACG on 03/04/2010   Method used:   Electronically to        [facility] (retail)       [address]       [place], [place]  [address]       Ph: [phone] or [phone]       Fax: [phone]   RxID:   [id number]

## 2010-10-31 NOTE — Procedures (Signed)
Summary: Colonoscopy   Colonoscopy  Procedure date:  02/09/2006  Findings:      Results: Hemorrhoids.     Location:   [facility]oscopy [facility].    Procedures Next Due Date:    Colonoscopy: 01/2011 Patient Name: Brett Franklin, Brett Franklin MRN:  Procedure Procedures: Colonoscopy CPT: 30865.  Personnel: Endoscopist: Venita Lick. Russella Dar, MD, [facility].  Exam Location: Exam performed in Outpatient Clinic. Outpatient  Patient Consent: Procedure, Alternatives, Risks and Benefits discussed, consent obtained, from patient. Consent was obtained by the RN.  Indications  Evaluation of: Positive fecal occult blood test  Symptoms: Constipation  Surveillance of: Adenomatous Polyp(s).  History  Current Medications: Patient is not currently taking Coumadin.  Pre-Exam Physical: Performed Feb 09, 2006. Cardio-pulmonary exam, Rectal exam, HEENT exam , Abdominal exam, Mental status exam WNL.  Comments: Pt. history reviewed/updated, physical exam performed prior to initiation of sedation?Yes Exam Exam: Extent of exam reached: Cecum, extent intended: Cecum.  The cecum was identified by appendiceal orifice and IC valve. The Cecum was reached at 3:23 PM. ended at 3:31 PM. Time for Withdrawl: 00:08. Colon retroflexion performed. ASA Classification: II. Tolerance: excellent.  Monitoring: Pulse and BP monitoring, Oximetry used. Supplemental O2 given.  Colon Prep Used MoviPrep for colon prep. Prep results: good.  Sedation Meds: Patient assessed and found to be appropriate for moderate (conscious) sedation. Fentanyl 75 mcg. given IV. Versed 9 mg. given IV.  Findings NORMAL EXAM: Cecum to Sigmoid Colon.  HEMORRHOIDS: Internal. Size: Small. Not bleeding. Not thrombosed. ICD9: Hemorrhoids, Internal: 455.0.   Assessment  Diagnoses: 455.0: Hemorrhoids, Internal.   Events  Unplanned Interventions: No intervention was required.  Unplanned Events: There were no complications. Plans  Post Exam  Instructions: Post sedation instructions given.  Patient Education: Patient given standard instructions for: Hemorrhoids.  Disposition: After procedure patient sent to recovery. After recovery patient sent home.  Scheduling/Referral: Colonoscopy, to [facility] T. Russella Dar, MD, [facility], around Feb 10, 2011.    This report was created from the original endoscopy report, which was reviewed and signed by the above listed endoscopist.    cc: Titus Dubin. Alwyn Ren, MD

## 2010-10-31 NOTE — Assessment & Plan Note (Signed)
Summary: COLD?/KDC   Vital Signs:  Patient profile:   77 year old male Weight:      203.8 pounds Temp:     97.7 degrees F oral Pulse rate:   76 / minute Resp:     14 per minute BP sitting:   128 / 86  (left arm) Cuff size:   large  Vitals Entered By: Shonna Chock (November 12, 2009 3:11 PM) CC: 1.) Cold x 3 weeks   2.)Refill meds  Comments REVIEWED MED LIST, PATIENT AGREED DOSE AND INSTRUCTION CORRECT    CC:  1.) Cold x 3 weeks   2.)Refill meds .  History of Present Illness: Onset 3 weeks ago as NP cough followed by head congestion 2 weeks ago. Now rhinitis, malaise. Rx: none.  Allergies: 1)  ! * Metformin 1000mg   Review of Systems General:  Complains of fatigue; denies chills, fever, and sweats. ENT:  Complains of nasal congestion and sinus pressure; denies earache and sore throat; R facial pain ; no purulence or frontal headache. Resp:  Complains of cough; denies shortness of breath, sputum productive, and wheezing. MS:  Complains of low back pain; Intermittent LBP. Allergy:  Denies itching eyes and sneezing.  Physical Exam  General:  Appears younger than age,well-nourished; alert,appropriate and cooperative throughout examination Ears:  External ear exam shows no significant lesions or deformities.  Otoscopic examination reveals clear canals, tympanic membranes are intact bilaterally without bulging, retraction, inflammation or discharge. Hearing is grossly normal bilaterally. Nose:  External nasal examination shows no deformity or inflammation. Nasal mucosa are pink and moist without lesions or exudates. Mouth:  Oral mucosa and oropharynx without lesions or exudates.  Teeth in good repair. Lungs:  Normal respiratory effort, chest expands symmetrically. Lungs are clear to auscultation, no crackles or wheezes. Minimal rales Cervical Nodes:  No lymphadenopathy noted Axillary Nodes:  No palpable lymphadenopathy   Impression & Recommendations:  Problem # 1:   BRONCHITIS-ACUTE (ICD-466.0)  His updated medication list for this problem includes:    Azithromycin 250 Mg Tabs (Azithromycin) .Marland Kitchen... As per pack    Promethazine Vc/codeine 6.25-5-10 Mg/91ml Syrp (Phenyleph-promethazine-cod) .Marland Kitchen... 1 tsp q 6 hrs as needed  Problem # 2:  URI (ICD-465.9)  His updated medication list for this problem includes:    Promethazine Vc/codeine 6.25-5-10 Mg/38ml Syrp (Phenyleph-promethazine-cod) .Marland Kitchen... 1 tsp q 6 hrs as needed  Problem # 3:  LOW BACK PAIN SYNDROME (ICD-724.2)  His updated medication list for this problem includes:    Tramadol Hcl 50 Mg Tabs (Tramadol hcl) .Marland Kitchen... 1 q 6 hrs as needed pain  Orders: Prescription Created Electronically [phone])  Complete Medication List: 1)  Glimepiride 2 Mg Tabs (Glimepiride) .... 1/2 tab qd 2)  Lisinopril 40 Mg Tabs (Lisinopril) .Marland Kitchen.. 1 by mouth qd 3)  Actoplus Met 15-850 Mg Tabs (Pioglitazone hcl-metformin hcl) .Marland Kitchen.. 1 by mouth bid 4)  Viagra 100 Mg Tabs (Sildenafil citrate) .... 1/2 -1 once daily prn 5)  Tramadol Hcl 50 Mg Tabs (Tramadol hcl) .Marland Kitchen.. 1 q 6 hrs as needed pain 6)  Azithromycin 250 Mg Tabs (Azithromycin) .... As per pack 7)  Promethazine Vc/codeine 6.25-5-10 Mg/24ml Syrp (Phenyleph-promethazine-cod) .Marland Kitchen.. 1 tsp q 6 hrs as needed  Patient Instructions: 1)  Schedule fasting labs:250.00, 401.9,995.20 2)  BMP prior to visit, ICD-9: 3)  Hepatic Panel prior to visit, ICD-9: 4)  Lipid Panel prior to visit, ICD-9: 5)  TSH prior to visit, ICD-9: 6)  HbgA1C prior to visit, ICD-9: Prescriptions: ACTOPLUS MET 15-850 MG  TABS (PIOGLITAZONE HCL-METFORMIN HCL) 1 by mouth bid  #180 x 1   Entered and Authorized by:   Marga Melnick MD   Signed by:   Marga Melnick MD on 11/12/2009   Method used:   Print then Give to Patient   RxID:   [id number] LISINOPRIL 40 MG TABS (LISINOPRIL) 1 by mouth qd  #90 x 1   Entered and Authorized by:   Marga Melnick MD   Signed by:   Marga Melnick MD on 11/12/2009   Method used:    Print then Give to Patient   RxID:   [id number] GLIMEPIRIDE 2 MG TABS (GLIMEPIRIDE) 1/2 tab qd  #90 x 0   Entered and Authorized by:   Marga Melnick MD   Signed by:   Marga Melnick MD on 11/12/2009   Method used:   Print then Give to Patient   RxID:   [id number] PROMETHAZINE VC/CODEINE 6.25-5-10 MG/5ML SYRP (PHENYLEPH-PROMETHAZINE-COD) 1 tsp q 6 hrs as needed  #120cc x 0   Entered and Authorized by:   Marga Melnick MD   Signed by:   Marga Melnick MD on 11/12/2009   Method used:   Printed then faxed to ...       [facility] (retail)       [address]       [place], [place]  [address]       Ph: [phone] or [phone]       Fax: [phone]   RxID:   [id number] TRAMADOL HCL 50 MG TABS (TRAMADOL HCL) 1 q 6 hrs as needed pain  #30 x 5   Entered and Authorized by:   Marga Melnick MD   Signed by:   Marga Melnick MD on 11/12/2009   Method used:   Faxed to ...       [facility] (retail)       [address]       [place], [place]  [address]       Ph: [phone] or [phone]       Fax: [phone]   RxID:   [id number] AZITHROMYCIN 250 MG TABS (AZITHROMYCIN) as per pack  #1 x 0   Entered and Authorized by:   Marga Melnick MD   Signed by:   Marga Melnick MD on 11/12/2009   Method used:   Faxed to ...       [facility] (retail)       [address]       [place], [place]  [address]       Ph: [phone] or [phone]       Fax: [phone]   RxID:   [id number]

## 2010-10-31 NOTE — Assessment & Plan Note (Signed)
Summary: 3 MTH FU/KDC   Vital Signs:  Patient profile:   77 year old male Weight:      191.6 pounds Pulse rate:   76 / minute Resp:     16 per minute BP sitting:   112 / 76  (left arm) Cuff size:   large  Vitals Entered By: Shonna Chock CMA (April 18, 2010 9:15 AM) CC: 3 month follow-up: copy of labs given. Patient would like 90/3 day suppy of Omeprazole vs the #30/11 refills that was given , Type 2 diabetes mellitus follow-up   Primary Care Provider:  Marga Melnick,  MD  CC:  3 month follow-up: copy of labs given. Patient would like 90/3 day suppy of Omeprazole vs the #30/11 refills that was given  and Type 2 diabetes mellitus follow-up.  History of Present Illness: Hypertension Follow-Up      This is an 77 year old man who presents for Hypertension follow-up.  The patient denies lightheadedness, urinary frequency, headaches, edema, rash, and fatigue.  The patient denies the following associated symptoms: chest pain, chest pressure, exercise intolerance, dyspnea, palpitations, syncope, leg edema, and pedal edema.  Compliance with medications (by patient report) has been near 100%.  The patient reports that dietary compliance has been good.  The patient reports exercising daily.  Adjunctive measures currently used by the patient include salt restriction.   BP averages 117/65. Hyperlipidemia Follow-Up      The patient also presents for Hyperlipidemia follow-up.  The patient denies muscle aches, GI upset, abdominal pain, flushing, itching, constipation, and diarrhea.  Compliance with medications (by patient report) has been near 100%.  Adjunctive measures currently used by the patient include ASA.  LDL 42 on Simvastatin 20 mg at bedtime. Type 2 Diabetes Mellitus Follow-Up      The patient is also here for Type 2 diabetes mellitus follow-up.  The patient reports self managed hypoglycemia, but denies polyuria, polydipsia, blurred vision, weight loss, weight gain, and numbness of extremities.   The patient denies the following symptoms: neuropathic pain, vomiting, orthostatic symptoms, poor wound healing, intermittent claudication, vision loss, and foot ulcer.  The patient has been measuring capillary blood glucose before breakfast; < 130.  Since the last visit, the patient reports having had no foot care.  Complications  in context of  diabetes include CVA/TIA.   A1c 6.8%.  Current Medications (verified): 1)  Glimepiride 2 Mg Tabs (Glimepiride) .... 1/2 Tab Qd 2)  Lisinopril 40 Mg Tabs (Lisinopril) .Marland Kitchen.. 1 By Mouth Qd 3)  Actoplus Met 15-850 Mg  Tabs (Pioglitazone Hcl-Metformin Hcl) .Marland Kitchen.. 1 By Mouth Once Daily 4)  Simvastatin 20 Mg Tabs (Simvastatin) .Marland Kitchen.. 1 By Mouth At Bedtime 5)  Amlodipine Besylate 5 Mg Tabs (Amlodipine Besylate) .... Take 1 Tab  Bid ** Bp Average < 135/85** 6)  Aspirin 325 Mg Tabs (Aspirin) .Marland Kitchen.. 1 By Mouth Once Daily 7)  Paroxetine Hcl 20 Mg Tabs (Paroxetine Hcl) .Marland Kitchen.. 1 At Bedtime 8)  Omeprazole 20 Mg  Cpdr (Omeprazole) .Marland Kitchen.. 1 Each Day 30 Minutes Before Meal  Allergies: 1)  ! * Metformin 1000mg   Review of Systems General:  Complains of sleep disorder; His wife notes limb jerking. Neurologist has recommended a Sleep Study.Marland Kitchen Resp:  Denies excessive snoring, hypersomnolence, and morning headaches.  Physical Exam  General:  well-nourished, appears younger than age; alert,appropriate and cooperative throughout examination Ears:  Decreased hearing  Lungs:  Normal respiratory effort, chest expands symmetrically. Lungs are clear to auscultation, no crackles or wheezes. Heart:  Normal rate and regular rhythm. S1 and S2 normal without gallop, murmur, click, rub. S4 Pulses:  R and L carotid,radial and posterior tibial pulses are full and equal bilaterally. Slightly decreased DPP Extremities:  No clubbing, cyanosis, edema. Good nail health Neurologic:  alert & oriented X3, strength normal in all extremities, sensation intact to light touch over feet, and DTRs symmetrical  and normal.   Minimal hand tremor Skin:  Intact without suspicious lesions or rashes Psych:  memory intact for recent and remote, normally interactive, and good eye contact.     Impression & Recommendations:  Problem # 1:  DIABETES MELLITUS, TYPE II, CONTROLLED (ICD-250.00)  His updated medication list for this problem includes:    Glimepiride 2 Mg Tabs (Glimepiride) .Marland Kitchen... 1/2 tab qd    Lisinopril 40 Mg Tabs (Lisinopril) .Marland Kitchen... 1 by mouth qd    Actoplus Met 15-850 Mg Tabs (Pioglitazone hcl-metformin hcl) .Marland Kitchen... 1 by mouth once daily    Aspirin 325 Mg Tabs (Aspirin) .Marland Kitchen... 1 by mouth once daily  Problem # 2:  HYPERTENSION (ICD-401.9) controlled His updated medication list for this problem includes:    Lisinopril 40 Mg Tabs (Lisinopril) .Marland Kitchen... 1 by mouth qd    Amlodipine Besylate 5 Mg Tabs (Amlodipine besylate) .Marland Kitchen... Take 1 tab  bid ** bp average < 135/85**  Problem # 3:  CEREBROVASCULAR ACCIDENT, HX OF (ICD-V12.50)  Problem # 4:  MOTOR PROBLEMS WITH LIMBS (ICD-V49.2)  ? sleep disorder  Orders: Venipuncture ([address]) TLB-Potassium (K+) (84132-K) TLB-Calcium (82310-CA) TLB-Magnesium (Mg) (83735-MG)  Complete Medication List: 1)  Glimepiride 2 Mg Tabs (Glimepiride) .... 1/2 tab qd 2)  Lisinopril 40 Mg Tabs (Lisinopril) .Marland Kitchen.. 1 by mouth qd 3)  Actoplus Met 15-850 Mg Tabs (Pioglitazone hcl-metformin hcl) .Marland Kitchen.. 1 by mouth once daily 4)  Simvastatin 10 Mg Tabs (simvastatin)  .Marland Kitchen.. 1 at bedtime 5)  Amlodipine Besylate 5 Mg Tabs (Amlodipine besylate) .... Take 1 tab  bid ** bp average < 135/85** 6)  Aspirin 325 Mg Tabs (Aspirin) .Marland Kitchen.. 1 by mouth once daily 7)  Paroxetine Hcl 20 Mg Tabs (Paroxetine hcl) .Marland Kitchen.. 1 at bedtime 8)  Omeprazole 20 Mg Cpdr (Omeprazole) .Marland Kitchen.. 1 each day 30 minutes before meal  Patient Instructions: 1)  Please have Sleep Study done. 2)  Please schedule a follow-up appointment in 3 months. 3)  Lipid Panel prior to visit, ICD-9:250.00 4)  HbgA1C prior to visit,  ICD-9:250.00 Prescriptions: SIMVASTATIN 10 MG TABS (SIMVASTATIN) 1 at bedtime  #90 x 1   Entered and Authorized by:   Marga Melnick MD   Signed by:   Marga Melnick MD on 04/18/2010   Method used:   Print then Give to Patient   RxID:   [id number] OMEPRAZOLE 20 MG  CPDR (OMEPRAZOLE) 1 each day 30 minutes before meal  #90 x 3   Entered by:   Shonna Chock CMA   Authorized by:   Marga Melnick MD   Signed by:   Shonna Chock CMA on 04/18/2010   Method used:   Printed then faxed to ...       [facility] (retail)       [address]       [place], [place]  [address]       Ph: [phone] or [phone]       Fax: [phone]   RxID:   [id number]   Appended Document: 3 MTH FU/KDC     Clinical Lists Changes  Orders: Added new Service order of Specimen Handling ([address]) - Signed

## 2010-10-31 NOTE — Miscellaneous (Signed)
Summary: Discharge Notice/[facility]  Discharge Notice/[facility]   Imported By: Lanelle Bal 02/13/2010 10:22:06  _____________________________________________________________________  External Attachment:    Type:   Image     Comment:   External Document

## 2010-10-31 NOTE — Progress Notes (Signed)
Summary: refill  Phone Note Refill Request Message from:  Fax from Pharmacy on June 25, 2010 4:53 PM  Refills Requested: Medication #1:  GLIMEPIRIDE 2 MG TABS 1/2 tab qd Jocelyn Lamer [id number]  Initial call taken by: Okey Regal [place],  June 25, 2010 4:53 PM    Prescriptions: GLIMEPIRIDE 2 MG TABS (GLIMEPIRIDE) 1/2 tab qd  #90 x 0   Entered by:   Jeremy Johann CMA   Authorized by:   Marga Melnick MD   Signed by:   Jeremy Johann CMA on 06/25/2010   Method used:   Printed then faxed to ...       [facility] (mail-order)       [address] [place], [place]  [address]       Ph: [phone]       Fax: [phone]   RxID:   [id number]

## 2010-10-31 NOTE — Letter (Signed)
Summary: Diabetic Instructions  [facility]  [address] [place], [place] [address]   Phone: [phone]  Fax: [phone]    Brett Franklin 03-Jun-1926 MRN: [id number]   _ x _   ORAL DIABETIC MEDICATION INSTRUCTIONS         (Glimepiride, Actolpus) The day before your procedure:   Take your diabetic pill as you do normally  The day of your procedure:   Do not take your diabetic pill    We will check your blood sugar levels during the admission process and again in Recovery before discharging you home

## 2010-10-31 NOTE — Assessment & Plan Note (Signed)
Summary: indigestion? wife made appt/cbs   Vital Signs:  Patient profile:   77 year old male Weight:      193.4 pounds BMI:     28.46 Temp:     97.6 degrees F oral Pulse rate:   72 / minute Resp:     15 per minute BP sitting:   116 / 68  (left arm) Cuff size:   large  Vitals Entered By: Shonna Chock CMA (September 09, 2010 1:46 PM) CC: Indigestions- worse x 10days, patient denies chest discomfort or pain , Heartburn, Type 2 diabetes mellitus follow-up   Primary Care Provider:  Marga Melnick,  MD  CC:  Indigestions- worse x 10days, patient denies chest discomfort or pain , Heartburn, and Type 2 diabetes mellitus follow-up.  History of Present Illness:      This is an 77 year old man who presents with  " indigestion" X 10 days .  The patient reports acid reflux, sour taste in mouth, and weight loss of 5# ( purposeful). He  denies epigastric pain, chest pain, and trouble swallowing.  The patient denies the following alarm features: melena, dysphagia, hematemesis, and vomiting.  Symptoms are worse with chocoltae & grease. No RUQ or back pain,  Treatment  found to be ineffective  include a PPI, Omeprazole 20 mg  once daily in am.          Despite the ERD symptoms , he states DM control has been good.The patient denies polyuria, polydipsia, blurred vision, and self managed hypoglycemia.  The patient denies the following symptoms: orthostatic symptoms.  Since the last visit the patient reports good dietary compliance.  The patient has been measuring capillary blood glucose before breakfast, average 107.    Current Medications (verified): 1)  Glimepiride 2 Mg Tabs (Glimepiride) .... 1/2 Tab Qd 2)  Lisinopril 40 Mg Tabs (Lisinopril) .Marland Kitchen.. 1 By Mouth Qd 3)  Simvastatin 10 Mg Tabs (Simvastatin) .Marland Kitchen.. 1 At Bedtime Except 1/2 Tues, Th & Sat 4)  Amlodipine Besylate 5 Mg Tabs (Amlodipine Besylate) .... Take 1 Tab By Month Daily ** Bp Average < 135/85** 5)  Aspirin 325 Mg Tabs (Aspirin) .Marland Kitchen.. 1 By Mouth  Once Daily 6)  Omeprazole 20 Mg  Cpdr (Omeprazole) .Marland Kitchen.. 1 Each Day 30 Minutes Before Meal 7)  Tramadol Hcl 50 Mg Tabs (Tramadol Hcl) .Marland Kitchen.. 1 By Mouth Every 6 Hours As Needed 8)  Hydromet 5-1.5 Mg/38ml Syrp (Hydrocodone-Homatropine) .Marland Kitchen.. 1 Tsp Every 6 Hrs As Needed 9)  Janumet 50-1000 Mg Tabs (Sitagliptin-Metformin Hcl) .Marland Kitchen.. 1 Two Times A Day With 2 Largest Meals  Allergies: 1)  ! * Metformin 1000mg   Physical Exam  General:  well-nourished,in no acute distress; alert,appropriate and cooperative throughout examination Eyes:  No corneal or conjunctival inflammation noted.No icterus Mouth:  Oral mucosa and oropharynx without lesions or exudates.  Teeth in good repair. No pharyngeal erythema.   Lungs:  Normal respiratory effort, chest expands symmetrically. Lungs are clear to auscultation, no crackles or wheezes. Heart:  Normal rate and regular rhythm. S1 and S2 normal without gallop, murmur, click, rub . S4 Abdomen:  Bowel sounds positive,abdomen soft and non-tender without masses, organomegaly or hernias noted. Skin:  Intact without suspicious lesions or rashes. No jaundice Cervical Nodes:  No lymphadenopathy noted Axillary Nodes:  No palpable lymphadenopathy Psych:  memory intact for recent and remote, normally interactive, and good eye contact.     Impression & Recommendations:  Problem # 1:  GERD (ICD-530.81)  His updated medication list for  this problem includes:    Omeprazole 20 Mg Cpdr (Omeprazole) .Marland Kitchen... 1 each day 30 minutes before  b'fast and eve meal  Orders: Venipuncture ([address]) TLB-CBC Platelet - w/Differential (85025-CBCD)  Problem # 2:  DIABETES MELLITUS, TYPE II, CONTROLLED (ICD-250.00)  R/O excess control with new dual agent & weight loss His updated medication list for this problem includes:    Glimepiride 2 Mg Tabs (Glimepiride) .Marland Kitchen... 1/2 tab qd    Lisinopril 40 Mg Tabs (Lisinopril) .Marland Kitchen... 1 by mouth qd    Aspirin 325 Mg Tabs (Aspirin) .Marland Kitchen... 1 by mouth once daily     Janumet 50-1000 Mg Tabs (Sitagliptin-metformin hcl) .Marland Kitchen... 1 two times a day with 2 largest meals  Orders: Venipuncture ([address]) TLB-A1C / Hgb A1C (Glycohemoglobin) (83036-A1C)  Complete Medication List: 1)  Glimepiride 2 Mg Tabs (Glimepiride) .... 1/2 tab qd 2)  Lisinopril 40 Mg Tabs (Lisinopril) .Marland Kitchen.. 1 by mouth qd 3)  Simvastatin 10 Mg Tabs (simvastatin)  .Marland Kitchen.. 1 at bedtime except 1/2 tues, th & sat 4)  Amlodipine Besylate 5 Mg Tabs (Amlodipine besylate) .... Take 1 tab by month daily ** bp average < 135/85** 5)  Aspirin 325 Mg Tabs (Aspirin) .Marland Kitchen.. 1 by mouth once daily 6)  Omeprazole 20 Mg Cpdr (Omeprazole) .Marland Kitchen.. 1 each day 30 minutes before  b'fast and eve meal 7)  Tramadol Hcl 50 Mg Tabs (Tramadol hcl) .Marland Kitchen.. 1 by mouth every 6 hours as needed 8)  Hydromet 5-1.5 Mg/51ml Syrp (Hydrocodone-homatropine) .Marland Kitchen.. 1 tsp every 6 hrs as needed 9)  Janumet 50-1000 Mg Tabs (Sitagliptin-metformin hcl) .Marland Kitchen.. 1 two times a day with 2 largest meals  Patient Instructions: 1)  Avoid foods high in acid (tomatoes, citrus juices, spicy foods). Avoid eating within two hours of lying down or before exercising. Do not over eat; try smaller more frequent meals. Elevate head of bed twelve inches when sleeping. Prescriptions: OMEPRAZOLE 20 MG  CPDR (OMEPRAZOLE) 1 each day 30 minutes before  b'fast AND eve meal  #60 x 1   Entered and Authorized by:   Marga Melnick MD   Signed by:   Marga Melnick MD on 09/09/2010   Method used:   Print then Give to Patient   RxID:   [id number] JANUMET 50-1000 MG TABS (SITAGLIPTIN-METFORMIN HCL) 1 two times a day with 2 largest meals  #90 x 1   Entered by:   Shonna Chock CMA   Authorized by:   Marga Melnick MD   Signed by:   Shonna Chock CMA on 09/09/2010   Method used:   Print then Give to Patient   RxID:   [id number]    Orders Added: 1)  Est. Patient Level IV [41324] 2)  Venipuncture [401[address]] 3)  TLB-CBC Platelet - w/Differential [85025-CBCD] 4)  TLB-A1C / Hgb  A1C (Glycohemoglobin) [83036-A1C]  Appended Document: indigestion? wife made appt/cbs

## 2010-10-31 NOTE — Miscellaneous (Signed)
Summary: SN Orders/[facility]  SN Orders/[facility]   Imported By: Lanelle Bal 01/22/2010 12:45:32  _____________________________________________________________________  External Attachment:    Type:   Image     Comment:   External Document

## 2010-10-31 NOTE — Procedures (Signed)
Summary: [facility]  [facility]   Imported By: Sherian Rein 01/08/2010 09:29:20  _____________________________________________________________________  External Attachment:    Type:   Image     Comment:   External Document

## 2010-10-31 NOTE — Miscellaneous (Signed)
Summary: OT Orders/[facility]  OT Orders/[facility]   Imported By: Lanelle Bal 01/04/2010 13:05:40  _____________________________________________________________________  External Attachment:    Type:   Image     Comment:   External Document

## 2010-10-31 NOTE — Letter (Signed)
Summary: [facility]  [facility]   Imported By: Sherian Rein 01/08/2010 09:22:56  _____________________________________________________________________  External Attachment:    Type:   Image     Comment:   External Document

## 2010-10-31 NOTE — Assessment & Plan Note (Signed)
Summary: high BP//lch   Vital Signs:  Patient profile:   77 year old male Weight:      196.8 pounds Temp:     98.4 degrees F oral Pulse rate:   60 / minute Resp:     16 per minute BP sitting:   134 / 82  (left arm) Cuff size:   large  Vitals Entered By: Shonna Chock (January 07, 2010 1:52 PM) CC: B/P up and down, face flushed off/on, just felt bad all weekend. ? Anxiety attack Comments REVIEWED MED LIST, PATIENT AGREED DOSE AND INSTRUCTION CORRECT    CC:  B/P up and down, face flushed off/on, and just felt bad all weekend. ? Anxiety attack.  History of Present Illness: BP 140-184/60-84 yesterday during anxiety attack. BP 128/70 this am.He is still anxious. No constellation of headaches, chest pain , flushing & diarrhea.  Allergies: 1)  ! * Metformin 1000mg   Review of Systems Psych:  Complains of anxiety and depression; denies easily angered, easily tearful, and irritability.  Physical Exam  General:  well-nourished,in no acute distress; alert,appropriate and cooperative throughout examination Heart:  Normal rate and regular rhythm. S1 and S2 normal without gallop, murmur, click, rub or other extra sounds. Pulses:  R and L carotid pulses are full and equal bilaterally Psych:  Oriented X3, memory intact for recent and remote, and slightly anxious.     Impression & Recommendations:  Problem # 1:  ANXIETY (ICD-300.00)  His updated medication list for this problem includes:    Lorazepam 0.5 Mg Tabs (Lorazepam) .Marland Kitchen... 1 every 6-8 as needed stress    Paroxetine Hcl 20 Mg Tabs (Paroxetine hcl) .Marland Kitchen... 1 at bedtime  Problem # 2:  CEREBROVASCULAR ACCIDENT, HX OF (ICD-V12.50)  Complete Medication List: 1)  Glimepiride 2 Mg Tabs (Glimepiride) .... 1/2 tab qd 2)  Lisinopril 40 Mg Tabs (Lisinopril) .Marland Kitchen.. 1 by mouth qd 3)  Actoplus Met 15-850 Mg Tabs (Pioglitazone hcl-metformin hcl) .Marland Kitchen.. 1 by mouth once daily 4)  Tramadol Hcl 50 Mg Tabs (Tramadol hcl) .Marland Kitchen.. 1 q 6 hrs as needed pain 5)   Simvastatin 20 Mg Tabs (Simvastatin) .Marland Kitchen.. 1 by mouth at bedtime 6)  Metoprolol Tartrate 50 Mg Tabs (Metoprolol tartrate) .... 1/2 two times a day 7)  Aspirin 325 Mg Tabs (Aspirin) .Marland Kitchen.. 1 by mouth once daily 8)  Vitamin E 400 Unit Caps (Vitamin e) .Marland Kitchen.. 1 by mouth once daily 9)  Fish Oil 1000 Mg Caps (Omega-3 fatty acids) .Marland Kitchen.. 1 by mouth once daily 10)  Icaps Caps (Multiple vitamins-minerals) .Marland Kitchen.. 1 by mouth once daily 11)  Lorazepam 0.5 Mg Tabs (Lorazepam) .Marland Kitchen.. 1 every 6-8 as needed stress 12)  Paroxetine Hcl 20 Mg Tabs (Paroxetine hcl) .Marland Kitchen.. 1 at bedtime  Patient Instructions: 1)  Review the Neurotransmitter diagram. 2)  Please schedule a follow-up appointment in 1 month. Prescriptions: PAROXETINE HCL 20 MG TABS (PAROXETINE HCL) 1 at bedtime  #30 x 5   Entered and Authorized by:   Marga Melnick MD   Signed by:   Marga Melnick MD on 01/07/2010   Method used:   Print then Give to Patient   RxID:   [id number] LORAZEPAM 0.5 MG TABS (LORAZEPAM) 1 every 6-8 as needed stress  #30 x 2   Entered and Authorized by:   Marga Melnick MD   Signed by:   Marga Melnick MD on 01/07/2010   Method used:   Print then Give to Patient   RxID:   [id number]

## 2010-10-31 NOTE — Miscellaneous (Signed)
Summary: Care Plan/[facility]  Care Plan/[facility]   Imported By: Lanelle Bal 01/08/2010 10:57:36  _____________________________________________________________________  External Attachment:    Type:   Image     Comment:   External Document

## 2010-10-31 NOTE — Letter (Signed)
Summary: [facility]  [facility]   Imported By: Lanelle Bal 03/07/2010 13:14:04  _____________________________________________________________________  External Attachment:    Type:   Image     Comment:   External Document

## 2010-10-31 NOTE — Progress Notes (Signed)
Summary: slowed heart rate med change  Phone Note Call from Patient Call back at [phone]   Caller: amy ([facility]) Summary of Call: Pt c/o feeling fatigue and sluggish. Pt BP 120/62 and a slowed heart rate of 48. pt currently taking metoprolol 50mg  1/2 two times a day and nurse would like to to discuss decreasing  med. Pls advise....Marland KitchenMarland KitchenFelecia Deloach CMA  January 10, 2010 10:26 AM  Initial call taken by: Jeremy Johann CMA,  January 10, 2010 10:20 AM  Follow-up for Phone Call        decrease metoprolol to 1/2 once daily & monitor pulse rate Follow-up by: Marga Melnick MD,  January 10, 2010 10:40 AM  Additional Follow-up for Phone Call Additional follow up Details #1::        nurse informed of med change and will inform pt .............Marland KitchenFelecia Deloach CMA  January 10, 2010 11:06 AM     New/Updated Medications: METOPROLOL TARTRATE 50 MG TABS (METOPROLOL TARTRATE) 1/2 tab  once daily

## 2010-10-31 NOTE — Letter (Signed)
Summary: Patient Notice-Endo Biopsy Results  [facility]  [address] [place], [place] [address]   Phone: [phone]  Fax: [phone]        March 25, 2010 MRN: [id number]    Brett Franklin [address] [place], [place]  [address]    Dear Brett Franklin,  I am pleased to inform you that the biopsies taken during your recent endoscopic examination did not show any evidence of cancer upon pathologic examination. The biopsies showed a reactive gastropathy.  Continue with the treatment plan as outlined on the day of your      exam.  Please call us if you are having persistent problems or have questions about your condition that have not been fully answered at this time.  Sincerely,  Meryl Dare MD [facility]  This letter has been electronically signed by your physician.  Appended Document: Patient Notice-Endo Biopsy Results letter mailed 7.6.11

## 2010-11-01 NOTE — Letter (Signed)
Summary: [facility]  [facility]   Imported By: Lanelle Bal 06/19/2010 11:17:11  _____________________________________________________________________  External Attachment:    Type:   Image     Comment:   External Document

## 2010-11-14 NOTE — Letter (Signed)
Summary: [facility]   Imported By: Lanelle Bal 11/04/2010 13:55:07  _____________________________________________________________________  External Attachment:    Type:   Image     Comment:   External Document

## 2010-11-27 ENCOUNTER — Telehealth: Payer: Self-pay | Admitting: Internal Medicine

## 2010-12-05 NOTE — Progress Notes (Signed)
Summary: Refill--Omeprazole  Phone Note Refill Request Call back at Home Phone [phone] Message from:  Patient on November 27, 2010 8:45 AM  Refills Requested: Medication #1:  OMEPRAZOLE 20 MG  CPDR 1 each day 30 minutes before  b'fast AND eve meal   Dosage confirmed as above?Dosage Confirmed   Supply Requested: 3 months send to [facility]ph--[phone]   Method Requested: Fax to Local Pharmacy Initial call taken by: Freddy Jaksch,  November 27, 2010 8:46 AM    Prescriptions: OMEPRAZOLE 20 MG  CPDR (OMEPRAZOLE) 1 each day 30 minutes before  b'fast AND eve meal  #90 x 1   Entered by:   Lucious Groves CMA   Authorized by:   Marga Melnick MD   Signed by:   Lucious Groves CMA on 11/27/2010   Method used:   Faxed to ...       [facility] (mail[facility]order)       [address] [place], [place]  [address]       Ph: [phone]       Fax: [phone]   RxID:   [id number]

## 2010-12-22 LAB — COMPREHENSIVE METABOLIC PANEL
ALT: 12 U/L (ref 0–53)
AST: 16 U/L (ref 0–37)
AST: 17 U/L (ref 0–37)
Albumin: 3.2 g/dL — ABNORMAL LOW (ref 3.5–5.2)
Albumin: 3.3 g/dL — ABNORMAL LOW (ref 3.5–5.2)
Alkaline Phosphatase: 35 U/L — ABNORMAL LOW (ref 39–117)
CO2: 26 mEq/L (ref 19–32)
Calcium: 8.3 mg/dL — ABNORMAL LOW (ref 8.4–10.5)
Chloride: 108 mEq/L (ref 96–112)
Creatinine, Ser: 0.93 mg/dL (ref 0.4–1.5)
GFR calc Af Amer: >60 mL/min (ref >60)
GFR calc Af Amer: >60 mL/min (ref >60)
GFR calc non Af Amer: >60 mL/min (ref >60)
Potassium: 3.5 mEq/L (ref 3.5–5.1)
Total Bilirubin: 0.6 mg/dL (ref 0.3–1.2)

## 2010-12-22 LAB — URINALYSIS, ROUTINE W REFLEX MICROSCOPIC
Bilirubin Urine: NEGATIVE
Glucose, UA: 250 mg/dL — AB
Hgb urine dipstick: NEGATIVE
Ketones, ur: NEGATIVE mg/dL
Nitrite: NEGATIVE
Protein, ur: NEGATIVE mg/dL
Specific Gravity, Urine: 1.028 (ref 1.005–1.030)
Urobilinogen, UA: 0.2 mg/dL (ref 0.0–1.0)
pH: 5.5 (ref 5.0–8.0)

## 2010-12-22 LAB — DIFFERENTIAL
Basophils Absolute: 0 10*3/uL (ref 0.0–0.1)
Basophils Absolute: 0.1 K/uL (ref 0.0–0.1)
Basophils Relative: 1 % (ref 0–1)
Eosinophils Absolute: 0.1 10*3/uL (ref 0.0–0.7)
Eosinophils Absolute: 0.1 K/uL (ref 0.0–0.7)
Eosinophils Relative: 1 % (ref 0–5)
Eosinophils Relative: 1 % (ref 0–5)
Lymphocytes Relative: 11 % — ABNORMAL LOW (ref 12–46)
Lymphocytes Relative: 14 % (ref 12–46)
Lymphs Abs: 1.2 K/uL (ref 0.7–4.0)
Monocytes Absolute: 0.7 K/uL (ref 0.1–1.0)
Monocytes Relative: 9 % (ref 3–12)
Neutro Abs: 6.5 K/uL (ref 1.7–7.7)
Neutrophils Relative %: 76 % (ref 43–77)
Neutrophils Relative %: 78 % — ABNORMAL HIGH (ref 43–77)

## 2010-12-22 LAB — CBC
HCT: 39.8 % (ref 39.0–52.0)
MCHC: 32.7 g/dL (ref 30.0–36.0)
MCV: 100.4 fL — ABNORMAL HIGH (ref 78.0–100.0)
MCV: 98.5 fL (ref 78.0–100.0)
Platelets: 142 10*3/uL — ABNORMAL LOW (ref 150–400)
Platelets: 189 10*3/uL (ref 150–400)
RDW: 13 % (ref 11.5–15.5)

## 2010-12-22 LAB — GLUCOSE, CAPILLARY
Glucose-Capillary: 106 mg/dL — ABNORMAL HIGH (ref 70–99)
Glucose-Capillary: 119 mg/dL — ABNORMAL HIGH (ref 70–99)
Glucose-Capillary: 126 mg/dL — ABNORMAL HIGH (ref 70–99)
Glucose-Capillary: 130 mg/dL — ABNORMAL HIGH (ref 70–99)
Glucose-Capillary: 150 mg/dL — ABNORMAL HIGH (ref 70–99)
Glucose-Capillary: 95 mg/dL (ref 70–99)
Glucose-Capillary: 95 mg/dL (ref 70–99)
Glucose-Capillary: 98 mg/dL (ref 70–99)

## 2010-12-22 LAB — POCT I-STAT, CHEM 8
BUN: 27 mg/dL — ABNORMAL HIGH (ref 6–23)
Chloride: 108 mEq/L (ref 96–112)
HCT: 44 % (ref 39.0–52.0)
Hemoglobin: 15 g/dL (ref 13.0–17.0)

## 2010-12-22 LAB — POCT CARDIAC MARKERS: Myoglobin, poc: 64.7 ng/mL (ref 12–200)

## 2010-12-22 LAB — LIPID PANEL: VLDL: 19 mg/dL (ref 0–40)

## 2010-12-22 LAB — URINE CULTURE: Colony Count: NO GROWTH

## 2010-12-22 LAB — HEMOGLOBIN A1C
Hgb A1c MFr Bld: 6.4 % — ABNORMAL HIGH (ref 4.6–6.1)
Mean Plasma Glucose: 137 mg/dL

## 2010-12-22 LAB — POTASSIUM: Potassium: 5.4 mEq/L — ABNORMAL HIGH (ref 3.5–5.1)

## 2010-12-22 LAB — ANA: Anti Nuclear Antibody(ANA): NEGATIVE

## 2010-12-22 LAB — HOMOCYSTEINE: Homocysteine: 13.7 umol/L (ref 4.0–15.4)

## 2011-01-16 ENCOUNTER — Other Ambulatory Visit: Payer: Self-pay | Admitting: *Deleted

## 2011-01-16 ENCOUNTER — Other Ambulatory Visit: Payer: Self-pay

## 2011-01-16 MED ORDER — LORAZEPAM 0.5 MG PO TABS: 0.5000 mg | ORAL_TABLET | Freq: Three times a day (TID) | ORAL | Status: AC | PRN

## 2011-01-16 MED ORDER — TRAMADOL HCL 50 MG PO TABS: 50.0000 mg | ORAL_TABLET | Freq: Four times a day (QID) | ORAL | Status: DC | PRN

## 2011-01-20 ENCOUNTER — Telehealth: Payer: Self-pay | Admitting: Internal Medicine

## 2011-01-20 NOTE — Telephone Encounter (Signed)
Patient said he has called pharmacy 3 times - he needs refill for simvastatin - [facility]

## 2011-01-22 ENCOUNTER — Other Ambulatory Visit: Payer: Self-pay | Admitting: *Deleted

## 2011-01-22 MED ORDER — SIMVASTATIN 10 MG PO TABS: 10.0000 mg | ORAL_TABLET | ORAL | Status: DC

## 2011-01-27 ENCOUNTER — Other Ambulatory Visit: Payer: Self-pay | Admitting: *Deleted

## 2011-01-27 MED ORDER — SIMVASTATIN 10 MG PO TABS: 10.0000 mg | ORAL_TABLET | ORAL | Status: DC

## 2011-01-27 NOTE — Telephone Encounter (Signed)
Patient Instructions: 1)  Please schedule a follow-up appointment in 3 months. 2)  BUN, creat, K+ prior to visit, ICD-9:401.9 3)  Lipid Panel prior to visit, ICD-9:250.00 4)  HbgA1C prior to visit, ICD-9:250.00 5)  Urine Microalbumin prior to visit, ICD-9:250.00  Copied from 07/2010 Patient instruction, Labs were due 10/2010

## 2011-02-14 NOTE — Op Note (Signed)
NAMEJAMAHL, LEMMONS               ACCOUNT NO.:  [id number]   MEDICAL RECORD NO.:  [id number]          PATIENT TYPE:  AMB   LOCATION:  [facility]                         FACILITY:  [facility]   PHYSICIAN:  Courtney Paris, M.D.DATE OF BIRTH:  [birth year]   DATE OF PROCEDURE:  07/02/2005  DATE OF DISCHARGE:                                 OPERATIVE REPORT   PREOPERATIVE DIAGNOSIS:  Hematuria with urethral stricture, inflammatory  lesions posterior bladder wall x2.   POSTOPERATIVE DIAGNOSIS:  Hematuria with urethral stricture, inflammatory  lesions posterior bladder wall x2.   OPERATIONS:  Cysto bladder biopsy x2, left posterior wall.   ANESTHESIA:  General.   SURGEON:  Courtney Paris, M.D.   BRIEF HISTORY:  The 77 year old patient is admitted with microscopic  hematuria and was found on Cysto to have some inflammatory lesions on the  posterior bladder wall. He enters for biopsy of these lesions. He has had no  history of stones. He has had a history of stones but none since 1998. On  his office Cysto was seen to have a wide mouth urethral stricture. He did  have recent fever and chills after his Cysto and was on antibiotics and  doing fine at this point.   The patient was placed on the operating table in dorsal lithotomy position  after satisfactory induction of general anesthesia. He was prepped and  draped with Betadine in the usual sterile fashion and given IV Cipro. A 21  panendoscope was inserted in the deep bulbous urethra just distal to the  external sphincter A wide mouth stricture was seen and photographed. The  scope went through this without difficulty and the bladder was entered. The  posterior urethra was not obstructing. The bladder was carefully inspected,  there was 1 to 2+ trabeculated, two small areas of hyperemia and erythema  were seen on the left posterior bladder base fairly close to one another.  These were each photographed separately and then together and  then with the  cold cup biopsy forceps, these lesions were excised. The base was then  fulgurated and another picture made of the results. All the lesions were  completely removed. Hemostasis was quite good. The bladder was drained and  the scope removed. The patient taken to the recovery room in good condition  to be  later discharged as an outpatient and finish his antibiotics as  instructed. Will be seen in the office in a week for follow-up.      Courtney Paris, M.D.  Electronically Signed     HMK/MEDQ  D:  07/02/2005  T:  07/02/2005  Job:  [id number]

## 2011-02-19 ENCOUNTER — Telehealth: Payer: Self-pay

## 2011-02-19 MED ORDER — HYDROCODONE-HOMATROPINE 5-1.5 MG/5ML PO SYRP: 5.0000 mL | ORAL_SOLUTION | ORAL | Status: DC

## 2011-02-19 NOTE — Telephone Encounter (Signed)
1 teaspoon every 6 hours dispense 90 cc; office visit if no better or if having fever or purulent secretions

## 2011-02-19 NOTE — Telephone Encounter (Signed)
Dr.Hopper please advise, this cough med was given to patient in 07/2010, Patient last seen 09/2010 for acute. Dr.Hopper please advise on refill request

## 2011-02-26 ENCOUNTER — Other Ambulatory Visit: Payer: Self-pay | Admitting: Dermatology

## 2011-03-03 ENCOUNTER — Other Ambulatory Visit: Payer: Self-pay | Admitting: Internal Medicine

## 2011-03-03 ENCOUNTER — Other Ambulatory Visit (INDEPENDENT_AMBULATORY_CARE_PROVIDER_SITE_OTHER): Payer: Medicare Other

## 2011-03-03 DIAGNOSIS — E119 Type 2 diabetes mellitus without complications: Secondary | ICD-10-CM

## 2011-03-03 LAB — HEMOGLOBIN A1C: Hgb A1c MFr Bld: 6.5 % (ref 4.6–6.5)

## 2011-03-05 ENCOUNTER — Encounter: Payer: Self-pay | Admitting: Internal Medicine

## 2011-03-06 ENCOUNTER — Encounter: Payer: Self-pay | Admitting: Internal Medicine

## 2011-03-06 ENCOUNTER — Ambulatory Visit (INDEPENDENT_AMBULATORY_CARE_PROVIDER_SITE_OTHER): Payer: Medicare Other | Admitting: Internal Medicine

## 2011-03-06 DIAGNOSIS — E119 Type 2 diabetes mellitus without complications: Secondary | ICD-10-CM

## 2011-03-06 DIAGNOSIS — R05 Cough: Secondary | ICD-10-CM

## 2011-03-06 DIAGNOSIS — K219 Gastro-esophageal reflux disease without esophagitis: Secondary | ICD-10-CM

## 2011-03-06 DIAGNOSIS — T44905A Adverse effect of unspecified drugs primarily affecting the autonomic nervous system, initial encounter: Secondary | ICD-10-CM

## 2011-03-06 DIAGNOSIS — I10 Essential (primary) hypertension: Secondary | ICD-10-CM

## 2011-03-06 DIAGNOSIS — C439 Malignant melanoma of skin, unspecified: Secondary | ICD-10-CM

## 2011-03-06 DIAGNOSIS — N6019 Diffuse cystic mastopathy of unspecified breast: Secondary | ICD-10-CM

## 2011-03-06 MED ORDER — LOSARTAN POTASSIUM 100 MG PO TABS: 100.0000 mg | ORAL_TABLET | Freq: Every day | ORAL | Status: DC

## 2011-03-06 NOTE — Progress Notes (Signed)
Subjective:    Patient ID: Brett Franklin, male    DOB: [birth year], 77 y.o.   MRN: [id number]  HPI #1  CHEST PAIN Location: L thorax axillary area  Quality: dull  Duration: seconds  Onset (rest, exertion):> 1 year  @ rest Radiation: no  Better with: no Worse with: no factors Symptoms History of Trauma/lifting: no  Nausea/vomiting: no  Diaphoresis: no  Shortness of breath: no  Pleuritic: no  Cough: no  Edema: no  Orthopnea: no  PND: no  Palpitations: no  Syncope: no  Indigestion: no   Red Flags Worse with exertion: no  Cancer history: yes, Melanoma  Tearing/radiation to back: no    #2 Diabetes status assessment: Fasting or morning glucose range:   average :  122  . Highest glucose 2 hours after any meal:  Not checked. Hypoglycemia :  no .                                                     Excess thirst :no;  Excess hunger:  no ;  Excess urination:  no.                                  Lightheadedness with standing: no. Non  healing skin  ulcers or sores,especially over the feet:  no. Numbness or tingling or burning in feet : no .                                                                                                                                              Significant change in  Weight : down 3# . Vision changes : no .                                                                    Exercise : walking 1.5 mpd . Nutrition/diet:  No diet. Medication compliance : yes. Eye exam : neg by Dr Elmer Picker. Foot care : no.  A1c/ urine microalbumin monitor:  6.5%      Review of Systems Dry cough w/o angioedema; he is on ACE-I. Dysphagia occasionally . Dyspepsia     Objective:   Physical Exam Gen.: Healthy and well-nourished in appearance. Alert, appropriate and cooperative throughout exam.appears younger than age Eyes: No corneal or conjunctival inflammation noted Neck: No deformities, masses, or tenderness noted.  Thyroid normal. Lungs: Normal respiratory  effort; chest expands symmetrically. Lungs  are clear to auscultation without rales, wheezes, or increased work of breathing.  Breasts : fibrocystic changes , L > R Heart: Normal rate and rhythm. Normal S1 and S2. No gallop, click, or rub. No murmur. Abdomen: Bowel sounds normal; abdomen soft and nontender. No masses, organomegaly or hernias noted. No clubbing, cyanosis, edema, or deformity noted. Nail health  Good. Homan's neg Vascular: Carotid, radial artery, dorsalis pedis and dorsalis posterior tibial pulses are full and equal. No bruits present. Neurologic: Alert and oriented x3. Deep tendon reflexes symmetrical and normal.          Skin: Intact without suspicious lesions or rashes. Lymph: No cervical or axillary  lymphadenopathy present. Psych: Mood and affect are normal. Normally interactive                                                                                        Assessment & Plan:  #1 fibrocystic breast disease ( not on Aldactone)  mimicing chest discomfort #2 cough from ACE-I #3 DM , controlled #4dysphagia Plan: see Orders

## 2011-03-06 NOTE — Patient Instructions (Addendum)
Change Lisinopril to losartan  Due to cough. Avoid caffeine . Take Vitamin E 400 units daily for tenderness. Diabetes Monitor   The A1c test is used primarily to monitor the glucose control of diabetics over time. The goal of those with diabetes is to keep their blood glucose levels as close to normal as possible. This helps to minimize the complications caused by chronically elevated glucose levels, such as progressive damage to body organs like the kidneys, eyes, cardiovascular system, and nerves. The A1c test gives a picture of the average amount of glucose in the blood over the last few months. It can help a patient and his doctor know if the measures they are taking to control the patient's diabetes are successful or need to be adjusted.        NORMAL VALUES  Non diabetic adults: 5 %-6.1%  Good diabetic control: 6.2-6.4 %  Fair diabetic control: 6.5-7%  Poor diabetic control: greater than 7 % ( except with additional factors such as  advanced age; significant coronary or neurologic disease,etc). Check the A1c every  4 months as it is  6.5% or higher. Goals for home glucose monitoring are : fasting  or morning glucose goal of  90-150. Two hours after any meal , goal = < 180, preferably < 150.

## 2011-03-06 NOTE — Assessment & Plan Note (Signed)
Seen every 3 mos

## 2011-03-07 ENCOUNTER — Ambulatory Visit (INDEPENDENT_AMBULATORY_CARE_PROVIDER_SITE_OTHER)
Admission: RE | Admit: 2011-03-07 | Discharge: 2011-03-07 | Disposition: A | Discharge status: Home / Self Care | Payer: Medicare Other | Source: Ambulatory Visit | Attending: Internal Medicine | Admitting: Internal Medicine

## 2011-03-07 DIAGNOSIS — T464X5A Adverse effect of angiotensin-converting-enzyme inhibitors, initial encounter: Secondary | ICD-10-CM

## 2011-03-07 DIAGNOSIS — R05 Cough: Secondary | ICD-10-CM

## 2011-03-07 DIAGNOSIS — T44905A Adverse effect of unspecified drugs primarily affecting the autonomic nervous system, initial encounter: Secondary | ICD-10-CM

## 2011-03-07 DIAGNOSIS — C439 Malignant melanoma of skin, unspecified: Secondary | ICD-10-CM

## 2011-03-20 ENCOUNTER — Other Ambulatory Visit: Payer: Self-pay

## 2011-03-20 MED ORDER — AMLODIPINE BESYLATE 5 MG PO TABS: 5.0000 mg | ORAL_TABLET | Freq: Every day | ORAL | Status: DC

## 2011-03-20 NOTE — Telephone Encounter (Signed)
Rx faxed to [phone]

## 2011-04-03 ENCOUNTER — Encounter: Payer: Self-pay | Admitting: Gastroenterology

## 2011-04-30 ENCOUNTER — Other Ambulatory Visit: Payer: Self-pay | Admitting: Internal Medicine

## 2011-04-30 MED ORDER — SITAGLIPTIN PHOS-METFORMIN HCL 50-1000 MG PO TABS: 1.0000 | ORAL_TABLET | Freq: Two times a day (BID) | ORAL | Status: DC

## 2011-04-30 NOTE — Telephone Encounter (Signed)
RX sent to pharmacy  

## 2011-05-19 ENCOUNTER — Ambulatory Visit (INDEPENDENT_AMBULATORY_CARE_PROVIDER_SITE_OTHER): Payer: Medicare Other | Admitting: Gastroenterology

## 2011-05-19 ENCOUNTER — Encounter: Payer: Self-pay | Admitting: Gastroenterology

## 2011-05-19 VITALS — BP 108/60 | HR 72 | Ht 71.0 in | Wt 186.0 lb

## 2011-05-19 DIAGNOSIS — Z860100 Personal history of colon polyps, unspecified: Secondary | ICD-10-CM

## 2011-05-19 DIAGNOSIS — K5900 Constipation, unspecified: Secondary | ICD-10-CM

## 2011-05-19 DIAGNOSIS — K219 Gastro-esophageal reflux disease without esophagitis: Secondary | ICD-10-CM

## 2011-05-19 NOTE — Patient Instructions (Addendum)
High Fiber diet given. Prune juice on regular basis. Drink atleast 6-8 glasses of water daily.

## 2011-05-19 NOTE — Progress Notes (Signed)
History of Present Illness: This is an 77 year old male who complains of mild intermittent constipation. He has had these symptoms frequently in the past. His symptoms are promptly relieved with prune juice. He does not drink much water or take many high-fiber foods in his diet. His reflux symptoms are under excellent control. He underwent colonoscopy and upper endoscopy in June 2011. Colonoscopy revealed an adenomatous colon polyp and internal hemorrhoids  Current Medications, Allergies, Past Medical History, Past Surgical History, Family History and Social History were reviewed in [employer] record.  Physical Exam: General: Well developed , well nourished, no acute distress Head: Normocephalic and atraumatic Eyes:  sclerae anicteric, EOMI Ears: Normal auditory acuity Mouth: No deformity or lesions Lungs: Clear throughout to auscultation Heart: Regular rate and rhythm; no murmurs, rubs or bruits Abdomen: Soft, non tender and non distended. No masses, hepatosplenomegaly or hernias noted. Normal Bowel sounds Musculoskeletal: Symmetrical with no gross deformities  Pulses:  Normal pulses noted Extremities: No clubbing, cyanosis, edema or deformities noted Neurological: Alert oriented x 4, grossly nonfocal Psychological:  Alert and cooperative. Normal mood and affect  Assessment and Recommendations:  1. Mild, chronic constipation. Long-term high fiber diet and prune juice on a regular basis. Increase daily water intake. GI followup when necessary.  2. Personal history of adenomatous colon polyps. No plans for surveillance screening colonoscopy due to age.  3. GERD. Continue same antireflux measures and omeprazole 20 mg every morning. GI followup when necessary.

## 2011-07-01 ENCOUNTER — Other Ambulatory Visit: Payer: Self-pay | Admitting: Internal Medicine

## 2011-07-18 ENCOUNTER — Encounter: Payer: Self-pay | Admitting: Internal Medicine

## 2011-07-21 ENCOUNTER — Ambulatory Visit (INDEPENDENT_AMBULATORY_CARE_PROVIDER_SITE_OTHER): Payer: Medicare Other | Admitting: Internal Medicine

## 2011-07-21 ENCOUNTER — Encounter: Payer: Self-pay | Admitting: Internal Medicine

## 2011-07-21 DIAGNOSIS — R490 Dysphonia: Secondary | ICD-10-CM

## 2011-07-21 DIAGNOSIS — E119 Type 2 diabetes mellitus without complications: Secondary | ICD-10-CM

## 2011-07-21 DIAGNOSIS — I10 Essential (primary) hypertension: Secondary | ICD-10-CM

## 2011-07-21 DIAGNOSIS — Z23 Encounter for immunization: Secondary | ICD-10-CM

## 2011-07-21 DIAGNOSIS — E785 Hyperlipidemia, unspecified: Secondary | ICD-10-CM

## 2011-07-21 DIAGNOSIS — E1169 Type 2 diabetes mellitus with other specified complication: Secondary | ICD-10-CM | POA: Insufficient documentation

## 2011-07-21 LAB — HEPATIC FUNCTION PANEL
ALT: 11 U/L (ref 0–53)
Bilirubin, Direct: 0.1 mg/dL (ref 0.0–0.3)
Total Protein: 7 g/dL (ref 6.0–8.3)

## 2011-07-21 LAB — BASIC METABOLIC PANEL
Chloride: 106 mEq/L (ref 96–112)
Creatinine, Ser: 1.1 mg/dL (ref 0.4–1.5)
Potassium: 4.1 mEq/L (ref 3.5–5.1)

## 2011-07-21 LAB — MICROALBUMIN / CREATININE URINE RATIO: Microalb, Ur: 1 mg/dL (ref 0.0–1.9)

## 2011-07-21 LAB — LIPID PANEL
Cholesterol: 109 mg/dL (ref 0–200)
LDL Cholesterol: 40 mg/dL (ref 0–99)
Triglycerides: 40 mg/dL (ref 0.0–149.0)
VLDL: 8 mg/dL (ref 0.0–40.0)

## 2011-07-21 MED ORDER — LOSARTAN POTASSIUM 100 MG PO TABS: 100.0000 mg | ORAL_TABLET | Freq: Every day | ORAL | Status: DC

## 2011-07-21 MED ORDER — SITAGLIPTIN PHOS-METFORMIN HCL 50-1000 MG PO TABS: 1.0000 | ORAL_TABLET | Freq: Two times a day (BID) | ORAL | Status: DC

## 2011-07-21 MED ORDER — SIMVASTATIN 10 MG PO TABS: 10.0000 mg | ORAL_TABLET | ORAL | Status: DC

## 2011-07-21 NOTE — Progress Notes (Signed)
  Subjective:    Patient ID: Brett Franklin, male    DOB: 15-Nov-1925, 77 y.o.   MRN: [id number]  HPI HYPERTENSION: Disease Monitoring: Blood pressure : averages 116/64  Chest pain, palpitations- no      Dyspnea- no Medications: Compliance- yes  Lightheadedness,Syncope- no    Edema- no  DIABETES: Disease Monitoring: Blood Sugar :average FBS112  Polyuria/phagia/dipsia- no       Visual problems- no;  Ophth exam every 6 mos Podiatrist: no Medications: Compliance- yes  Hypoglycemic symptoms- no  HYPERLIPIDEMIA: Disease Monitoring: See symptoms for Hypertension Medications: Compliance- yes  Abd pain, bowel changes- no   Muscle aches- no    PMH Smoking Status:never       Review of Systems   He describes intermittent hoarseness. He denies extrinsic symptoms of itchy eyes or sneezing. He's had intermittent left axillary discomfort which is nonexertional.  He walks 1.5 at least once a day and sometimes twice with no chest pain or dyspnea     Objective:   Physical Exam Gen.: Healthy  & well-nourished, appropriate and alert, weight excellent; appears younger than age Eyes: No lid/conjunctival changes, extraocular motion intact  Mouth: No erythema or exudates. Hoarseness not appreciated  Ears: Hearing is markedly reduced. Cerumen is present without frank impaction. Neck: slightly reduced range of motion, thyroidsmall Respiratory: No increased work of breathing or abnormal breath sounds Cardiac : regular rhythm, no extra heart sounds, gallop, murmur  Lymph: No lymphadenopathy of the neck or axilla Skin: No rashes, lesions, ulcers or ischemic changes Muscle skeletal: no nail changes; strength and tone are excellent. Clinically no cervical radiculopathy is suggested based on direct testing Vasc:All pulses intact, but dorsalis pedis pulses are decreased. No bruits present Neuro: Normal deep tendon reflexes, alert & oriented, sensation over feet intact Psych: judgment and  insight, mood and affect normal         Assessment & Plan:  #1 hypertension well controlled  #2 diabetes; fasting blood sugar suggests excellent control  #3 dyslipidemia,  assessment required  #4 intermittent nonexertional left axillary pain. This may be related to his known cervical stenosis.  #5 hoarseness  Plan: See orders and recommendations

## 2011-07-21 NOTE — Progress Notes (Signed)
Addended by: Edgardo Roys on: 07/21/2011 08:53 AM   Modules accepted: Orders

## 2011-07-21 NOTE — Patient Instructions (Signed)
Eat a low-fat diet with lots of fruits and vegetables, up to 7-9 servings per day. Avoid obesity; your goal is waist measurement < 40 inches.Consume less than 40 grams of sugar per day from foods & drinks with High Fructose Corn Sugar as #1,2,3 or # 4 on label. Follow the low carb nutrition program in [employer] as closely as possible to prevent Diabetes progression & complications. White carbohydrates (potatoes, rice, bread, and pasta) have a high spike of sugar and a high load of sugar. For example a  baked potato has a cup of sugar and a  french fry  2 teaspoons of sugar. Yams, wild  rice, whole grained bread &  wheat pasta have been much lower spike and load of  sugar. Portions should be the size of a deck of cards or your palm.  

## 2011-11-04 ENCOUNTER — Encounter: Payer: Self-pay | Admitting: Gastroenterology

## 2011-11-04 ENCOUNTER — Ambulatory Visit (INDEPENDENT_AMBULATORY_CARE_PROVIDER_SITE_OTHER): Payer: Medicare Other | Admitting: Gastroenterology

## 2011-11-04 VITALS — BP 120/76 | HR 64 | Ht 70.0 in | Wt 182.2 lb

## 2011-11-04 DIAGNOSIS — K5900 Constipation, unspecified: Secondary | ICD-10-CM

## 2011-11-04 DIAGNOSIS — K219 Gastro-esophageal reflux disease without esophagitis: Secondary | ICD-10-CM

## 2011-11-04 NOTE — Progress Notes (Signed)
History of Present Illness: This is an 78 year old male with a history of irritable bowel syndrome. He has had recent problems with mild constipation, generally a bowel movement every 3 days. At times he has had daily bowel movements. He asked questions about taking ex-lax, fleets enemas and suppositories. He underwent colonoscopy in June 2011 which showed internal hemorrhoids and a small adenomatous colon polyp. Denies weight loss, abdominal pain, diarrhea, change in stool caliber, melena, hematochezia, nausea, vomiting, dysphagia, reflux symptoms, chest pain.  Current Medications, Allergies, Past Medical History, Past Surgical History, Family History and Social History were reviewed in [employer] record.  Physical Exam: General: Well developed , well nourished, no acute distress Head: Normocephalic and atraumatic Eyes:  sclerae anicteric, EOMI Ears: Normal auditory acuity Mouth: No deformity or lesions Lungs: Clear throughout to auscultation Heart: Regular rate and rhythm; no murmurs, rubs or bruits Abdomen: Soft, non tender and non distended. No masses, hepatosplenomegaly or hernias noted. Normal Bowel sounds Musculoskeletal: Symmetrical with no gross deformities  Pulses:  Normal pulses noted Extremities: No clubbing, cyanosis, edema or deformities noted Neurological: Alert oriented x 4, grossly nonfocal Psychological:  Alert and cooperative. Normal mood and affect  Assessment and Recommendations:  1. Constipation. Increase dietary fiber and water intake. Colace daily and if this is not sufficient begin MiraLax once daily. Reserve ex-lax and suppositories for refractory constipation. Avoid use of enemas if possible.  2. Personal history of adenomatous colon polyps. No plans for future surveillance for screening colonoscopies due to age.   3.GERD. Symptoms well-controlled on Prilosec 20 mg daily and antireflux measures.  4. History of irritable bowel syndrome

## 2011-11-04 NOTE — Patient Instructions (Signed)
Start taking Miralax over the counter mixing 17 grams in 8 oz of water daily for constipation. Take Colace daily.  Increase your fluids daily to help with constipation. Constipation information given.  Please call us back if your symptoms do not get better or worsen. cc: Marga Melnick, MD

## 2011-12-08 ENCOUNTER — Encounter: Payer: Self-pay | Admitting: Internal Medicine

## 2011-12-08 ENCOUNTER — Ambulatory Visit (INDEPENDENT_AMBULATORY_CARE_PROVIDER_SITE_OTHER): Payer: Medicare Other | Admitting: Internal Medicine

## 2011-12-08 DIAGNOSIS — J209 Acute bronchitis, unspecified: Secondary | ICD-10-CM

## 2011-12-08 DIAGNOSIS — E119 Type 2 diabetes mellitus without complications: Secondary | ICD-10-CM

## 2011-12-08 DIAGNOSIS — I10 Essential (primary) hypertension: Secondary | ICD-10-CM

## 2011-12-08 DIAGNOSIS — J069 Acute upper respiratory infection, unspecified: Secondary | ICD-10-CM

## 2011-12-08 MED ORDER — HYDROCODONE-HOMATROPINE 5-1.5 MG/5ML PO SYRP: 5.0000 mL | ORAL_SOLUTION | Freq: Four times a day (QID) | ORAL | Status: AC | PRN

## 2011-12-08 MED ORDER — TRAMADOL HCL 50 MG PO TABS: 50.0000 mg | ORAL_TABLET | Freq: Four times a day (QID) | ORAL | Status: DC | PRN

## 2011-12-08 MED ORDER — AMLODIPINE BESYLATE 5 MG PO TABS: 5.0000 mg | ORAL_TABLET | Freq: Every day | ORAL | Status: DC

## 2011-12-08 MED ORDER — AMOXICILLIN 500 MG PO CAPS: 500.0000 mg | ORAL_CAPSULE | Freq: Three times a day (TID) | ORAL | Status: AC

## 2011-12-08 MED ORDER — LORAZEPAM 0.5 MG PO TABS: ORAL_TABLET | ORAL | Status: DC

## 2011-12-08 NOTE — Patient Instructions (Signed)
Stop Janumet  and recheck A1c in 12 weeks.PLEASE BRING THESE INSTRUCTIONS TO FOLLOW UP  LAB APPOINTMENT.This will guarantee correct labs are drawn, eliminating need for repeat blood sampling ( needle sticks ! ). Diagnoses /Codes: 250.00.

## 2011-12-08 NOTE — Progress Notes (Signed)
  Subjective:    Patient ID: Brett Franklin, male    DOB: [birth year], 78 y.o.   MRN: [id number]  HPI Respiratory tract infection Onset/symptoms: 6-7 days ago as a sore throat Exposures (illness/environmental/extrinsic):Wife was ill Progression of symptoms: head and chest congestion Treatments/response:saline gargles Present symptoms:  Fever/chills/sweats:none Frontal headache:none Facial pain:none   Nasal purulence:none Sore throat:none at this time Dental pain:none Lymphadenopathy:none Wheezing/shortness of breath:none Cough/sputum/hemoptysis:nonproductive cough Associated extrinsic/allergic symptoms:itchy eyes/ sneezing:none Past medical history: Seasonal allergies/asthma:none Smoking history:non-smoker           Review of Systems Glucose is well controlled, averaging 110; low of 80 at onset of illness     Objective:   Physical Exam General appearance:good health ;well nourished; no acute distress or increased work of breathing is present.  No  lymphadenopathy about the head, neck, or axilla noted.   Eyes: No conjunctival inflammation or lid edema is present.   Ears:  External ear exam shows no significant lesions or deformities.  Otoscopic examination reveals clear canals, tympanic membranes are intact bilaterally without bulging, retraction, inflammation or discharge.  Nose:  External nasal examination shows no deformity or inflammation. Nasal mucosa are dry without lesions or exudates. No septal dislocation or deviation.No obstruction to airflow.   Oral exam: Dental hygiene is good; lips and gums are healthy appearing.There is no oropharyngeal erythema or exudate noted.    Heart:  Normal rate and regular rhythm. S1 and S2 normal without gallop, murmur, click, rub.S 4 Lungs:Chest clear to auscultation; no wheezes, rhonchi,rales ,or rubs present.No increased work of breathing.  Decreased BS  Extremities:  No cyanosis, edema, or clubbing  noted    Skin: Warm & dry             Assessment & Plan:  #1 acute bronchitis w/o bronchospasm & URI # 2 DM , well controlled Plan: See orders and recommendations

## 2012-01-22 ENCOUNTER — Other Ambulatory Visit: Payer: Self-pay | Admitting: Dermatology

## 2012-02-27 ENCOUNTER — Telehealth: Payer: Self-pay | Admitting: Internal Medicine

## 2012-02-27 DIAGNOSIS — E119 Type 2 diabetes mellitus without complications: Secondary | ICD-10-CM

## 2012-02-27 NOTE — Telephone Encounter (Signed)
Patient coming in Monday 6.3 to our lab here to recheck the A1c in 12 weeks (250.00 ), can you put in an order please? Thanks

## 2012-02-27 NOTE — Telephone Encounter (Signed)
Order placed

## 2012-03-01 ENCOUNTER — Other Ambulatory Visit (INDEPENDENT_AMBULATORY_CARE_PROVIDER_SITE_OTHER): Payer: Medicare Other

## 2012-03-01 DIAGNOSIS — E119 Type 2 diabetes mellitus without complications: Secondary | ICD-10-CM

## 2012-03-01 LAB — HEMOGLOBIN A1C: Hgb A1c MFr Bld: 7.2 % — ABNORMAL HIGH (ref 4.6–6.5)

## 2012-03-01 NOTE — Progress Notes (Signed)
Labs only

## 2012-03-05 ENCOUNTER — Ambulatory Visit (INDEPENDENT_AMBULATORY_CARE_PROVIDER_SITE_OTHER): Payer: Medicare Other | Admitting: Internal Medicine

## 2012-03-05 ENCOUNTER — Encounter: Payer: Self-pay | Admitting: Internal Medicine

## 2012-03-05 VITALS — BP 110/66 | HR 72 | Wt 180.1 lb

## 2012-03-05 DIAGNOSIS — I10 Essential (primary) hypertension: Secondary | ICD-10-CM

## 2012-03-05 DIAGNOSIS — T887XXA Unspecified adverse effect of drug or medicament, initial encounter: Secondary | ICD-10-CM

## 2012-03-05 DIAGNOSIS — R12 Heartburn: Secondary | ICD-10-CM

## 2012-03-05 DIAGNOSIS — E119 Type 2 diabetes mellitus without complications: Secondary | ICD-10-CM

## 2012-03-05 MED ORDER — METOPROLOL TARTRATE 25 MG PO TABS: ORAL_TABLET | ORAL | Status: DC

## 2012-03-05 MED ORDER — OMEPRAZOLE 20 MG PO CPDR: DELAYED_RELEASE_CAPSULE | ORAL | Status: DC

## 2012-03-05 NOTE — Assessment & Plan Note (Signed)
The most important factor is that he have NO hypoglycemia. A 1 goal is  8% or less

## 2012-03-05 NOTE — Patient Instructions (Signed)
Blood Pressure Goal  is an AVERAGE < 140/90 , ideally < 135/85. This AVERAGE should be calculated from @ least 5-7 BP readings taken @ different times of day on different days of week. You should not respond to isolated BP readings , but rather the AVERAGE for that week  A1c GOALS  excellent  diabetic control: 6.5-7  %  good diabetic control: 7-8 %  Poor diabetic control: greater than 8 %  Check the A1c every 6 months if it is < 6.5%; every 4 months if  6.5% or higher. Goals for home glucose monitoring are : fasting  or morning glucose goal of  100-150. Two hours after any meal , goal = < 180, preferably < 160. Report any low blood glucoses immediately.

## 2012-03-05 NOTE — Progress Notes (Signed)
Subjective:    Patient ID: Brett Franklin, male    DOB: 02/05/1926, 78 y.o.   MRN: [id number]  HPI Diabetes status assessment: Fasting or morning glucose range: 120-196  . Highest glucose 2 hours after any meal:  no. Hypoglycemia : no .                                                     Significant change in  Weight : stable. Vision changes :no; last Ophth exam 5/13 negative but macular degeneration supplement was prescribed  .                                                                    Exercise : walking 1.5 mpd . Nutrition/diet: no. Medication compliance : yes. Medication adverse  Effects:not definitely but  see below . Foot care : nails trimmed @ salon.  A1c/ urine microalbumin monitor:  7.2%; 6.1% in 10/12       Review of Systems Excess thirst ; hunger; urination:  no                                 Lightheadedness with standing:  no Chest pain:  no ; Palpitations :no ;  Pain in  calves with walking:  no .                                                                                                                             Non healing skin  ulcers or sores,especially over the feet: no. Numbness or tingling or burning in feet : no .      One week ago he was started with mouthwash and felt as if his mouth were parched. He discontinue the mouthwash and changed toothpaste; it is continued to have a sensation of swelling of his lips. He is on losartan but not on ACE inhibitor. He denies any cough, wheezing, or shortness of breath .  His dyspepsia is well controlled with the omeprazole. He denies dysphagia, melena, rectal bleeding  Objective:   Physical Exam Gen.: Healthy  & well-nourished, appropriate and alert, weight excellent.Appears younger than stated age  Eyes: No lid/conjunctival changes, extraocular motion intact Nares: patent w/o  exudate Mouth : no angioedema or oral pharyngeal crowding, no erythema Neck: Normal range of motion, thyroid normal Respiratory: No increased work of breathing or abnormal breath sounds Cardiac : regular rhythm, no extra heart sounds, gallop, murmur. S 4 Abdomen: No organomegaly ,masses, bruits or aortic enlargement Lymph: No lymphadenopathy of the neck or axilla Skin: No rashes, lesions, ulcers or ischemic changes Muscle skeletal: no nail changes; joints normal Vasc:All pulses equal but DPP decreased, no bruits present Neuro: Normal deep tendon reflexes, alert & oriented, sensation over feet intact Psych: judgment and insight, mood and affect normal         Assessment & Plan:  see problem list with assessments and recommendations

## 2012-03-05 NOTE — Assessment & Plan Note (Signed)
Blood pressure is well controlled but the losartan may be causing an  angioedema variant. Low-dose beta blocker will be initiated in place of the losartan

## 2012-03-05 NOTE — Assessment & Plan Note (Signed)
Symptom control is good with the protein pump inhibitor; no change

## 2012-07-08 ENCOUNTER — Ambulatory Visit (INDEPENDENT_AMBULATORY_CARE_PROVIDER_SITE_OTHER): Payer: Medicare Other

## 2012-07-08 DIAGNOSIS — Z23 Encounter for immunization: Secondary | ICD-10-CM

## 2012-07-09 ENCOUNTER — Telehealth: Payer: Self-pay

## 2012-07-09 NOTE — Telephone Encounter (Signed)
Message copied by Annett Fabian on Fri Jul 09, 2012  3:20 PM ------      Message from: Claudette Head T      Created: Thu Jul 08, 2012  2:52 PM       Has REV for "indigestion" on 10/14. Not sure he needs office appt. as I saw him in 10/2011. Has known GERD and a h/o an esophageal stricture. Had EGD in 2011. If his symptoms sound like GERD we can increase his omeprazole to 20 mg bid or 40 mg daily instead of having an REV if he is ok with that.

## 2012-07-09 NOTE — Telephone Encounter (Signed)
I did speak with the patient and he would like to speak with Dr. Russella Dar and not cancel the appt.  He will come in on 07/12/12 3:15

## 2012-07-12 ENCOUNTER — Ambulatory Visit (INDEPENDENT_AMBULATORY_CARE_PROVIDER_SITE_OTHER): Payer: Medicare Other | Admitting: Gastroenterology

## 2012-07-12 ENCOUNTER — Encounter: Payer: Self-pay | Admitting: Gastroenterology

## 2012-07-12 VITALS — BP 120/62 | HR 60 | Ht 69.5 in | Wt 186.8 lb

## 2012-07-12 DIAGNOSIS — R1319 Other dysphagia: Secondary | ICD-10-CM

## 2012-07-12 DIAGNOSIS — K117 Disturbances of salivary secretion: Secondary | ICD-10-CM

## 2012-07-12 DIAGNOSIS — K219 Gastro-esophageal reflux disease without esophagitis: Secondary | ICD-10-CM

## 2012-07-12 DIAGNOSIS — R682 Dry mouth, unspecified: Secondary | ICD-10-CM

## 2012-07-12 MED ORDER — OMEPRAZOLE 40 MG PO CPDR: 40.0000 mg | DELAYED_RELEASE_CAPSULE | Freq: Two times a day (BID) | ORAL | Status: DC

## 2012-07-12 NOTE — Progress Notes (Signed)
History of Present Illness: This is an 78 year old male here today with several complaints. His main complaint is that of dry mouth and dry lips which has occurred intermittently for the past few months. He also notes occasional breakthrough reflux symptoms that seem to respond well to taking omeprazole 20 mg twice a day or three times a day. He has had infrequent episodes of mild transient solid food dysphagia.  Current Medications, Allergies, Past Medical History, Past Surgical History, Family History and Social History were reviewed in [employer] record.  Physical Exam: General: Well developed , well nourished, no acute distress Head: Normocephalic and atraumatic Eyes:  sclerae anicteric, EOMI Ears: Normal auditory acuity Mouth: No deformity or lesions Lungs: Clear throughout to auscultation Heart: Regular rate and rhythm; no murmurs, rubs or bruits Abdomen: Soft, non tender and non distended. No masses, hepatosplenomegaly or hernias noted. Normal Bowel sounds Musculoskeletal: Symmetrical with no gross deformities  Pulses:  Normal pulses noted Extremities: No clubbing, cyanosis, edema or deformities noted Neurological: Alert oriented x 4, grossly nonfocal Psychological:  Alert and cooperative. Normal mood and affect  Assessment and Recommendations:  1. GERD with occasional breakthrough symptoms. Reintensify all antireflux measures. Increase omeprazole to 40 mg twice a day.  2. Mild occasional dysphagia. He has a prior history of a peptic stricture. Schedule barium esophagram. If he has a stricture will proceed with upper endoscopy and dilation.  3. Dry mouth and dry lips. Further followup with his primary physician or ENT physician

## 2012-07-12 NOTE — Patient Instructions (Addendum)
We have sent the following medications to your pharmacy for you to pick up at your convenience: Omeprazole 40 mg one tablet by mouth twice daily.  You have been scheduled for a Barium Esophogram at [facility] (1st floor of the hospital) on 07/14/12 at 10:00am. Please arrive 15 minutes prior to your appointment for registration. Make certain not to have anything to eat or drink 6 hours prior to your test. If you need to reschedule for any reason, please contact radiology at [phone] to do so.   We have scheduled you an appointment at [facility] on 07/16/12 at 1:40pm to see Dr. Emeline Darling. Please arrive 15 minutes prior for registration and please bring your medication list and co-pay. If your need to cancel or reschedule your appointment please call [phone].

## 2012-07-14 ENCOUNTER — Ambulatory Visit (HOSPITAL_COMMUNITY)
Admission: RE | Admit: 2012-07-14 | Discharge: 2012-07-14 | Disposition: A | Discharge status: Home / Self Care | Payer: Medicare Other | Source: Ambulatory Visit | Attending: Gastroenterology | Admitting: Gastroenterology

## 2012-07-14 DIAGNOSIS — K224 Dyskinesia of esophagus: Secondary | ICD-10-CM | POA: Insufficient documentation

## 2012-07-14 DIAGNOSIS — IMO0002 Reserved for concepts with insufficient information to code with codable children: Secondary | ICD-10-CM | POA: Insufficient documentation

## 2012-07-14 DIAGNOSIS — T17308A Unspecified foreign body in larynx causing other injury, initial encounter: Secondary | ICD-10-CM | POA: Insufficient documentation

## 2012-07-14 DIAGNOSIS — K219 Gastro-esophageal reflux disease without esophagitis: Secondary | ICD-10-CM | POA: Insufficient documentation

## 2012-07-16 ENCOUNTER — Other Ambulatory Visit: Payer: Self-pay | Admitting: Internal Medicine

## 2012-08-27 ENCOUNTER — Other Ambulatory Visit: Payer: Self-pay | Admitting: Internal Medicine

## 2012-08-27 NOTE — Telephone Encounter (Signed)
Rx sent.    MW 

## 2012-08-29 ENCOUNTER — Other Ambulatory Visit: Payer: Self-pay | Admitting: Internal Medicine

## 2012-08-30 NOTE — Telephone Encounter (Signed)
Having problems with e prescribe. Rx printed to be faxed.   MW

## 2012-09-06 ENCOUNTER — Telehealth: Payer: Self-pay | Admitting: Internal Medicine

## 2012-09-06 NOTE — Telephone Encounter (Signed)
Patient called stating he is due for a1c and a follow up visit. Will he need any additional labs or only a1c?

## 2012-09-06 NOTE — Telephone Encounter (Signed)
Please  schedule fasting Labs : BMET,Lipids, hepatic panel, A1c, urine microalbumin, TSH. Code: 250.00, S/P CVA

## 2012-09-07 NOTE — Telephone Encounter (Signed)
Tried to call Pt VM not set up unable to leave message.

## 2012-09-08 ENCOUNTER — Other Ambulatory Visit (INDEPENDENT_AMBULATORY_CARE_PROVIDER_SITE_OTHER): Payer: Medicare Other

## 2012-09-08 DIAGNOSIS — E119 Type 2 diabetes mellitus without complications: Secondary | ICD-10-CM

## 2012-09-08 LAB — HEPATIC FUNCTION PANEL
ALT: 12 U/L (ref 0–53)
Albumin: 3.8 g/dL (ref 3.5–5.2)
Alkaline Phosphatase: 57 U/L (ref 39–117)
Bilirubin, Direct: 0.1 mg/dL (ref 0.0–0.3)
Total Protein: 6.6 g/dL (ref 6.0–8.3)

## 2012-09-08 LAB — LIPID PANEL
Cholesterol: 86 mg/dL (ref 0–200)
LDL Cholesterol: 35 mg/dL (ref 0–99)
Triglycerides: 31 mg/dL (ref 0.0–149.0)

## 2012-09-08 LAB — BASIC METABOLIC PANEL
Chloride: 100 mEq/L (ref 96–112)
Creatinine, Ser: 1 mg/dL (ref 0.4–1.5)
Sodium: 137 mEq/L (ref 135–145)

## 2012-09-09 NOTE — Telephone Encounter (Signed)
Pt scheduled for 09/10/12.

## 2012-09-10 ENCOUNTER — Ambulatory Visit (INDEPENDENT_AMBULATORY_CARE_PROVIDER_SITE_OTHER): Payer: Medicare Other | Admitting: Internal Medicine

## 2012-09-10 ENCOUNTER — Encounter: Payer: Self-pay | Admitting: Internal Medicine

## 2012-09-10 VITALS — BP 112/66 | HR 65 | Resp 13 | Wt 183.0 lb

## 2012-09-10 DIAGNOSIS — E785 Hyperlipidemia, unspecified: Secondary | ICD-10-CM

## 2012-09-10 DIAGNOSIS — E119 Type 2 diabetes mellitus without complications: Secondary | ICD-10-CM

## 2012-09-10 DIAGNOSIS — I10 Essential (primary) hypertension: Secondary | ICD-10-CM

## 2012-09-10 MED ORDER — SIMVASTATIN 10 MG PO TABS: ORAL_TABLET | ORAL | Status: DC

## 2012-09-10 MED ORDER — LORAZEPAM 0.5 MG PO TABS: ORAL_TABLET | ORAL | Status: DC

## 2012-09-10 MED ORDER — METFORMIN HCL 500 MG PO TABS: 500.0000 mg | ORAL_TABLET | Freq: Two times a day (BID) | ORAL | Status: DC

## 2012-09-10 MED ORDER — TRAMADOL HCL 50 MG PO TABS: 50.0000 mg | ORAL_TABLET | Freq: Three times a day (TID) | ORAL | Status: DC | PRN

## 2012-09-10 NOTE — Progress Notes (Signed)
Subjective:    Patient ID: Brett Franklin, male    DOB: 1926-02-03, 78 y.o.   MRN: [id number]  HPI The patient is here for followup of diabetes, hyperlipidemia, and hypertension.  The most recent A1c 09/08/12  was  8.5 % , which correlates to an average sugar of 197 , and long-term risk of  70 %. Fasting blood sugar average is 165  .  Two-hour postprandial glucose not monitored . No hypoglycemia Last ophthalmologic examination 9/13   revealed no retinopathy. No active podiatry assessment on record. Diet is  Low sugar/ low carb . Exercise : 1.5- 2 mpd  .  The most recent lipids  12/11  reveal LDL 35 , HDL 44.8 , and triglycerides 31  . There is medical compliance with the statin.  Blood pressure range  or average is 120/90 or <  . There is medical compliance with antihypertensive medications         Review of Systems Constitutional: No fever, chills, significant weight change,  weakness or night sweats Eyes: No  blurred vision, double vision, or loss of vision Cardiovascular: no chest pain, palpitations, racing, irregular rhythm,syncope,nausea,sweating, claudication, or edema  Respiratory: No exertinal dyspnea, paroxysmal nocturnal dyspnea Gastrointestinal: No heartburn,dysphagia, diarrhea, significant constipation, rectal bleeding, melena Genitourinary: No dysuria,hematuria, pyuria, frequency,  incontinence, nocturia Musculoskeletal: No myalgias or muscle cramping , weakness, or cyanosis Dermatologic: No rash, pruritus, urticaria, or change in color or temperature of skin Neurologic: No  limb weakness,  numbness or tingling Endocrine: No change in hair/skin/ nails, excessive thirst, excessive hunger, excessive urination, or unexplained fatigue     Objective:   Physical Exam Gen.:  well-nourished in appearance. Alert, appropriate and cooperative throughout exam.Appears younger than stated age    Eyes: No corneal or conjunctival inflammation noted. Mouth: Oral mucosa and  oropharynx reveal no lesions or exudates. Teeth in good repair. Neck: No deformities, masses, or tenderness noted.  Thyroid normal. Lungs: Normal respiratory effort; chest expands symmetrically. Lungs are clear to auscultation without rales, wheezes, or increased work of breathing. Heart: Normal rate and rhythm. Normal S1 and S2. No gallop, click, or rub. S4 w/o murmur. Abdomen: Bowel sounds normal; abdomen soft and nontender. No masses, organomegaly or hernias noted.                                                                                Musculoskeletal/extremities: No clubbing, cyanosis, edema, or deformity noted. Range of motion  normal .Tone & strength  normal.Joints normal. Nail health  good. Vascular: Carotid, radial artery, dorsalis pedis and  posterior tibial pulses are full and equal. No bruits present. Neurologic: Alert and oriented x3. Deep tendon reflexes symmetrical and normal. Light touch normal over feet.           Skin: Intact without suspicious lesions or rashes. Lymph: No cervical, axillary lymphadenopathy present. Psych: Mood and affect are normal. Normally interactive  Assessment & Plan:

## 2012-09-10 NOTE — Patient Instructions (Addendum)
Please  schedule fasting Labs in 10-11 weeks: Lipids, A1c. PLEASE BRING THESE INSTRUCTIONS TO FOLLOW UP  LAB APPOINTMENT.This will guarantee correct labs are drawn, eliminating need for repeat blood sampling ( needle sticks ! ). Diagnoses /Codes: 272.4; 250.02.  If you activate My Chart; the results can be released to you as soon as they populate from the lab. If you choose not to use this program; the labs have to be reviewed, copied & mailed   causing a delay in getting the results to you.

## 2012-09-10 NOTE — Assessment & Plan Note (Addendum)
His LDL is phenomenally low at 35. Simvastatin will be decreased  to 5 mg daily.

## 2012-09-10 NOTE — Assessment & Plan Note (Signed)
Blood pressure control is good. Kidney function is normal. He has minimal elevation of BUN which suggests mild dehydration. No change indicated

## 2012-09-10 NOTE — Assessment & Plan Note (Addendum)
Metformin 500 mg twice a day with 2 largest meals will be started. A1c will be rechecked in 10 weeks

## 2012-09-13 ENCOUNTER — Telehealth: Payer: Self-pay | Admitting: *Deleted

## 2012-09-13 MED ORDER — SITAGLIPTIN PHOS-METFORMIN HCL 50-500 MG PO TABS: 1.0000 | ORAL_TABLET | Freq: Every day | ORAL | Status: DC

## 2012-09-13 NOTE — Telephone Encounter (Signed)
Spoke with patient, rx sent to local pharmacy

## 2012-09-13 NOTE — Telephone Encounter (Signed)
Janumet 50/500 mg # 30 RX 2 in place of Metformin

## 2012-09-13 NOTE — Telephone Encounter (Signed)
Dr.Hopper please advise 

## 2012-09-13 NOTE — Telephone Encounter (Signed)
Patient called with questions about diabetic medication. Pt states he is currently taking metformin but has been prescribed Janumet 50mg  in the past. Patient states metformin causes him severe diarrhea and he would like to change to Janumet as is worked well for him in the past.

## 2012-09-23 ENCOUNTER — Ambulatory Visit: Payer: Medicare Other | Admitting: Internal Medicine

## 2012-12-04 ENCOUNTER — Other Ambulatory Visit: Payer: Self-pay | Admitting: Internal Medicine

## 2013-01-05 ENCOUNTER — Other Ambulatory Visit (INDEPENDENT_AMBULATORY_CARE_PROVIDER_SITE_OTHER): Payer: Medicare Other

## 2013-01-05 DIAGNOSIS — E785 Hyperlipidemia, unspecified: Secondary | ICD-10-CM

## 2013-01-05 LAB — LIPID PANEL
Cholesterol: 93 mg/dL (ref 0–200)
HDL: 41.4 mg/dL (ref >39.00)

## 2013-01-05 LAB — HEMOGLOBIN A1C: Hgb A1c MFr Bld: 7.1 % — ABNORMAL HIGH (ref 4.6–6.5)

## 2013-01-06 ENCOUNTER — Encounter: Payer: Self-pay | Admitting: *Deleted

## 2013-01-06 ENCOUNTER — Encounter: Payer: Self-pay | Admitting: Lab

## 2013-01-07 ENCOUNTER — Ambulatory Visit (INDEPENDENT_AMBULATORY_CARE_PROVIDER_SITE_OTHER): Payer: Medicare Other | Admitting: Internal Medicine

## 2013-01-07 ENCOUNTER — Encounter: Payer: Self-pay | Admitting: Internal Medicine

## 2013-01-07 VITALS — BP 128/80 | HR 62 | Wt 182.0 lb

## 2013-01-07 DIAGNOSIS — E785 Hyperlipidemia, unspecified: Secondary | ICD-10-CM

## 2013-01-07 DIAGNOSIS — E119 Type 2 diabetes mellitus without complications: Secondary | ICD-10-CM

## 2013-01-07 DIAGNOSIS — I10 Essential (primary) hypertension: Secondary | ICD-10-CM

## 2013-01-07 MED ORDER — SITAGLIPTIN PHOS-METFORMIN HCL 50-500 MG PO TABS: 1.0000 | ORAL_TABLET | Freq: Every day | ORAL | Status: DC

## 2013-01-07 NOTE — Progress Notes (Signed)
Subjective:    Patient ID: Brett Franklin, male    DOB: 01/03/1926, 79 y.o.   MRN: [id number]  HPI The patient is here for followup of diabetes hyperlipidemia and hypertension.  The most recent A1c was 7.1%  , which correlates to an average sugar of 157 , and long-term risk of 42%. This represents a significant improvement as the A1c had been 8.5% which would be an average sugar 197 and long-term risk of 70%. He is only on Janumet 50/500 once daily. Fasting blood sugar  average 135, range 96-179.  Two-hour postprandial glucose is  not monitored . No hypoglycemia reported Last ophthalmologic examination 6 mos ago  revealed no retinopathy. No active podiatry assessment on record. Diet is low carb /sugar restricted . Exercise  7 X/ week as walking 1-1.5 mpd.  The most recent lipids   reveal LDL 45 ; 41.4 ; and triglycerides 35  . There is medical compliance with the low dose (10 mg of Simvastati) statin.  Blood pressure average 120/57 . There is medical  compliance with antihypertensive medications  No  medication adverse effects suggested .      Review of Systems Constitutional: No  significant weight change;  excess fatigue Eyes: No  blurred vision;  double vision ; loss of vision Cardiovascular: no chest pain ;palpitations; racing; irregular rhythm ; claudication ; edema  Respiratory: No exertional dyspnea;  paroxysmal nocturnal dyspnea Genitourinary: No dysuria; hematuria ; pyuria; frequency;  Incontinence;  nocturia Musculoskeletal: No myalgias or muscle cramping  Dermatologic: No non healing  Lesions; change in color or temperature of skin Neurologic: No  limb weakness;  numbness or tingling;  burning Endocrine: No change in hair, skin, nails except precancerous lesion L ear. No excessive thirst; excessive hunger;  excessive urination       Objective:   Physical Exam Gen.: Healthy and well-nourished in appearance. Alert, appropriate and cooperative throughout exam.Appears  younger than stated age  Head: Normocephalic without obvious abnormalities Eyes: No corneal or conjunctival inflammation noted.  Neck: No deformities, masses, or tenderness noted. Thyroid normal. Lungs: Normal respiratory effort; chest expands symmetrically. Lungs are clear to auscultation without rales, wheezes, or increased work of breathing. Heart: Slow rate and regular rhythm. Normal S1 and S2. No gallop, click, or rub. S4 w/o murmur. Abdomen: Bowel sounds normal; abdomen soft and nontender. No masses, organomegaly or hernias noted.                               Musculoskeletal/extremities: No clubbing, cyanosis, edema, or significant extremity  deformity noted. Tone & strength  Normal.Joints normal . Nail health good. Able to lie down & sit up w/o help. Vascular: Carotid & radial artery  are full and equal. Decreased dorsalis pedis and  posterior tibial pulses decreased.No bruits present. Neurologic: Alert and oriented x3. Deep tendon reflexes symmetrical and normal.  Light touch normal over feet. Skin: Intact without suspicious lesions or rashes. Lymph: No cervical, axillary lymphadenopathy present. Psych: Mood and affect are normal. Normally interactive  Assessment & Plan:

## 2013-01-07 NOTE — Assessment & Plan Note (Signed)
LDL is @ protective level on low-dose statin. No change indicated

## 2013-01-07 NOTE — Patient Instructions (Addendum)
Please schedule followup physical appointment in 5-6 months. Please bring all actual pill bottles and supplements to that physical.  If you activate the  My Chart system; lab & Xray results will be released directly  to you as soon as I review & address these through the computer. If you choose not to sign up for My Chart within 36 hours of labs being drawn; results will be reviewed & interpretation added before being copied & mailed, causing a delay in getting the results to you.If you do not receive that report within 7-10 days ,please call. Additionally you can use this system to gain direct  access to your records  if  out of town or @ an office of a  physician who is not in  the My Chart network.  This improves continuity of care & places you in control of your medical record.

## 2013-01-07 NOTE — Assessment & Plan Note (Signed)
Blood pressure control is also excellent. No change indicated. Renal function should be checked in 6 months at followup

## 2013-01-07 NOTE — Assessment & Plan Note (Signed)
As noted there's been a dramatic improvement in his diabetes control. This is on a single combination agent, excellent nutrition, and regular exercise. No change is indicated. A1c should be checked with urine microalbumin 6 months

## 2013-01-27 ENCOUNTER — Other Ambulatory Visit: Payer: Self-pay | Admitting: Dermatology

## 2013-02-02 ENCOUNTER — Other Ambulatory Visit: Payer: Self-pay | Admitting: Internal Medicine

## 2013-02-02 NOTE — Telephone Encounter (Signed)
Med filled.  

## 2013-05-05 ENCOUNTER — Other Ambulatory Visit: Payer: Self-pay | Admitting: Dermatology

## 2013-05-11 ENCOUNTER — Ambulatory Visit (INDEPENDENT_AMBULATORY_CARE_PROVIDER_SITE_OTHER): Payer: Medicare Other | Admitting: Internal Medicine

## 2013-05-11 ENCOUNTER — Encounter: Payer: Self-pay | Admitting: Internal Medicine

## 2013-05-11 VITALS — BP 115/60 | HR 68 | Temp 97.5°F | Wt 183.2 lb

## 2013-05-11 DIAGNOSIS — E119 Type 2 diabetes mellitus without complications: Secondary | ICD-10-CM

## 2013-05-11 DIAGNOSIS — K589 Irritable bowel syndrome without diarrhea: Secondary | ICD-10-CM

## 2013-05-11 DIAGNOSIS — Z86010 Personal history of colonic polyps: Secondary | ICD-10-CM

## 2013-05-11 DIAGNOSIS — K219 Gastro-esophageal reflux disease without esophagitis: Secondary | ICD-10-CM

## 2013-05-11 DIAGNOSIS — R1012 Left upper quadrant pain: Secondary | ICD-10-CM

## 2013-05-11 LAB — POCT URINALYSIS DIPSTICK
Ketones, UA: NEGATIVE
Protein, UA: NEGATIVE
Spec Grav, UA: >=1.03
pH, UA: 6

## 2013-05-11 NOTE — Progress Notes (Signed)
  Subjective:    Patient ID: Brett Franklin, male    DOB: 07-23-1926, 79 y.o.   MRN: [id number]  HPI   His symptoms began 4/5 weeks ago without any specific trigger or injury. He describes intermittent dull pain in the left upper quadrant which does not radiate. It does last a few seconds. It is worse with rotation of the abdomen. It is up to level 3. ? Swelling in inguinal area. No factors identified which improve such as urination , bowel movement, or self treatment with OTCs.     Past medical history of  Reflux, colon polyp , IBS , & renal calculi. No family history of ulcer, reflux,  colitis,  colon polyps, colon cancer.         Review of Systems Nausea vomiting, constipation , diarrhea, black tarry stool, rectal bleeding  denied. Dyspepsia (heart burn) is chronic & unchanged.No dysphagia , anorexia , hematemesis .  Weight stable.  Fever,chills,  sweats denied Dysuria , pyuria , hematuria also denied. No associated rash  in the area of the discomfort. No associated radicular pain . On aspirin daily; severe bruising described.Diet  cola up to 2 per day. No alcohol, peppermint, coffee or tea.       Objective:   Physical Exam Gen.: Healthy and well-nourished in appearance. Alert, appropriate and cooperative throughout exam.Appears younger than stated age   Eyes: No corneal or conjunctival inflammation noted. No icterus Ears: Hearing aids bilaterally. Nose: External nasal exam reveals no deformity or inflammation. Nasal mucosa are pink and moist. No lesions or exudates noted.  Mouth: Oral mucosa and oropharynx reveal no lesions or exudates. Teeth in good repair. Neck: No deformities, masses, or tenderness noted.  Lungs: Normal respiratory effort; chest expands symmetrically. Lungs are clear to auscultation without rales, wheezes, or increased work of breathing. Heart: Normal rate and rhythm. Normal S1 and S2. No gallop, click, or rub. S4 with slurring at  LSB. Abdomen: Bowel  sounds normal; abdomen soft and nontender. No masses, organomegaly or hernias noted. Genitalia: Genitalia normal                                 Musculoskeletal/extremities: No clubbing, cyanosis, edema, or significant extremity  deformity noted. Able to lie down & sit up w/o help. Negative SLR bilaterally Vascular: Carotid, radial artery, dorsalis pedis and  posterior tibial pulses are full and equal. No bruits present.  Neurologic: Alert and oriented x3. Skin: Intact without suspicious lesions or rashes.Multiple biopsy sites Lymph: No cervical, axillary, or inguinal lymphadenopathy present. Psych: Mood and affect are normal. Normally interactive                                                                                        Assessment & Plan:  #1 LUQ intermittent pain lasting seconds. This is in the context of past history of colon polyps, year-old bowel, and nephrolithiasis.  #2 inguinal/groin subjective swelling. No pathology noted.  See orders & recommendations

## 2013-05-11 NOTE — Patient Instructions (Addendum)
Please complete and return stool cards; these will determine whether there is any gastrointestinal bleeding risk. Reflux of gastric acid may be asymptomatic as this may occur mainly during sleep.The triggers for reflux  include stress; the "aspirin family" ; alcohol; peppermint; and caffeine (coffee, tea, cola, and chocolate). The aspirin family would include aspirin and the nonsteroidal agents such as ibuprofen &  Naproxen. Tylenol would not cause reflux. If having symptoms ; food & drink should be avoided for @ least 2 hours before going to bed.

## 2013-05-12 LAB — CBC WITH DIFFERENTIAL/PLATELET
Basophils Relative: 0.2 % (ref 0.0–3.0)
Eosinophils Relative: 0.7 % (ref 0.0–5.0)
HCT: 45.2 % (ref 39.0–52.0)
Lymphs Abs: 1.8 10*3/uL (ref 0.7–4.0)
MCHC: 33.4 g/dL (ref 30.0–36.0)
MCV: 100.5 fl — ABNORMAL HIGH (ref 78.0–100.0)
Monocytes Absolute: 0.7 10*3/uL (ref 0.1–1.0)
Platelets: 186 10*3/uL (ref 150.0–400.0)
RBC: 4.5 Mil/uL (ref 4.22–5.81)
WBC: 12.1 10*3/uL — ABNORMAL HIGH (ref 4.5–10.5)

## 2013-05-12 LAB — HEMOGLOBIN A1C: Hgb A1c MFr Bld: 7.1 % — ABNORMAL HIGH (ref 4.6–6.5)

## 2013-05-16 ENCOUNTER — Other Ambulatory Visit: Payer: Self-pay | Admitting: Internal Medicine

## 2013-05-19 ENCOUNTER — Other Ambulatory Visit: Payer: Self-pay | Admitting: Internal Medicine

## 2013-05-19 NOTE — Telephone Encounter (Signed)
Rx sent to the pharmacy by e-script.//AB/CMA 

## 2013-07-01 ENCOUNTER — Other Ambulatory Visit (INDEPENDENT_AMBULATORY_CARE_PROVIDER_SITE_OTHER): Payer: Medicare Other

## 2013-07-01 ENCOUNTER — Encounter: Payer: Self-pay | Admitting: *Deleted

## 2013-07-01 DIAGNOSIS — Z0000 Encounter for general adult medical examination without abnormal findings: Secondary | ICD-10-CM

## 2013-07-01 DIAGNOSIS — Z1289 Encounter for screening for malignant neoplasm of other sites: Secondary | ICD-10-CM

## 2013-07-01 DIAGNOSIS — R1012 Left upper quadrant pain: Secondary | ICD-10-CM

## 2013-07-01 DIAGNOSIS — Z1211 Encounter for screening for malignant neoplasm of colon: Secondary | ICD-10-CM

## 2013-07-01 LAB — POC HEMOCCULT BLD/STL (HOME/3-CARD/SCREEN)
Card #2 Fecal Occult Blod, POC: NEGATIVE
Fecal Occult Blood, POC: NEGATIVE

## 2013-07-01 NOTE — Progress Notes (Signed)
Letter mailed to patient.

## 2013-07-01 NOTE — Addendum Note (Signed)
Addended by: Silvio Pate D on: 07/01/2013 02:56 PM   Modules accepted: Orders

## 2013-07-05 ENCOUNTER — Telehealth: Payer: Self-pay

## 2013-07-05 NOTE — Telephone Encounter (Signed)
Medication List and allergies: done  Pharmacy updated, uses [facility] for 90 day supply Pharmacy undated, uses [facility] for local prescriptions  HM UTD: yes  A/P: HM due: flu vaccine, t-dap PSA: See urology once per year CCS: done Last: 02/02/2006 HTN: due  To Discuss with Provider: Left sided pain better but not gone Allergies? Can't sleep

## 2013-07-06 ENCOUNTER — Ambulatory Visit (INDEPENDENT_AMBULATORY_CARE_PROVIDER_SITE_OTHER): Payer: Medicare Other | Admitting: Internal Medicine

## 2013-07-06 ENCOUNTER — Encounter: Payer: Self-pay | Admitting: Internal Medicine

## 2013-07-06 VITALS — BP 132/76 | HR 62 | Temp 97.9°F | Ht 69.75 in | Wt 180.4 lb

## 2013-07-06 DIAGNOSIS — E785 Hyperlipidemia, unspecified: Secondary | ICD-10-CM

## 2013-07-06 DIAGNOSIS — Z23 Encounter for immunization: Secondary | ICD-10-CM

## 2013-07-06 DIAGNOSIS — H9193 Unspecified hearing loss, bilateral: Secondary | ICD-10-CM

## 2013-07-06 DIAGNOSIS — Z0000 Encounter for general adult medical examination without abnormal findings: Secondary | ICD-10-CM

## 2013-07-06 DIAGNOSIS — I10 Essential (primary) hypertension: Secondary | ICD-10-CM

## 2013-07-06 DIAGNOSIS — H9190 Unspecified hearing loss, unspecified ear: Secondary | ICD-10-CM

## 2013-07-06 DIAGNOSIS — E119 Type 2 diabetes mellitus without complications: Secondary | ICD-10-CM

## 2013-07-06 DIAGNOSIS — Z86010 Personal history of colonic polyps: Secondary | ICD-10-CM

## 2013-07-06 MED ORDER — SITAGLIPTIN PHOS-METFORMIN HCL 50-500 MG PO TABS: ORAL_TABLET | ORAL | Status: DC

## 2013-07-06 NOTE — Progress Notes (Signed)
Subjective:    Patient ID: Brett Franklin, male    DOB: May 31, 1926, 79 y.o.   MRN: [id number]  HPI Medicare Wellness Visit: Psychosocial and medical history were reviewed as required by Medicare (abuse, antisocial behavior risk, forearm risk). Social history: Caffeine:minimal  , Alcohol:no  , Tobacco use:no Exercise: walks 2 mpd over 40 min w/o symptoms Personal safety/fall risk:no Limitations of activities of daily living:no Seatbelt smoke alarm use:yes Healthcare Power of Attorney/Living Will status: in place  Ophthalmologic exam status:current ; cataract surgery 07/12/13 Hearing evaluation status:re-evaluation requested ; hearing aids from [employer] need tested Orientation: Oriented X3 Memory and recall: 2 of 3 (PMH of CVA) Math testing: only 100-7 correct Depression/anxiety assessment: denied,Lorazepam rarely taken Foreign travel history:1994 [place]  Immunization status the shingles/bleeding/pneumonia/tetanus: Flu needed Transfusion history:no Preventive health care maintenance status: Colonoscopy as per protocol/standard care: aged out Dental care:every 6 mos                                      Chart reviewed and updated. Active issues reviewed and addressed.    Review of Systems HYPERTENSION: Disease Monitoring: Blood pressure range-< 140/90 Chest pain, palpitations- no       Dyspnea- no Medications: Compliance- yes  Lightheadedness,Syncope- no   Edema- no  He was previously on an ACE inhibitor but this was discontinued because of a cough. At that time he was changed to an angiotensin receptor blocker. He is not taking this agent at this time for unknown reasons. Blood pressure is well controlled   DIABETES: Disease Monitoring: Blood Sugar ranges-145 on average Polyuria/phagia/dipsia- no       Visual problems- cataracts Medications: Compliance- yes Hypoglycemic symptoms- no   HYPERLIPIDEMIA: Disease Monitoring: See symptoms for  Hypertension Medications: Compliance- yes  Abd pain, bowel changes- no   Muscle aches- no       Objective:   Physical Exam Gen.: Healthy and well-nourished in appearance. Alert, appropriate and cooperative throughout exam.Appears younger than stated age  Head: Normocephalic without obvious abnormalities; no alopecia  Eyes: No corneal or conjunctival inflammation noted. Pupils equal round reactive to light and accommodation. Ptosis bilaterally Ears: External  ear exam reveals no significant lesions or deformities.  Hearing aids bilaterally yet still exhibits profound hearing loss Nose: External nasal exam reveals no deformity or inflammation. Nasal mucosa are pink and moist. No lesions or exudates noted.  Mouth: Oral mucosa and oropharynx reveal no lesions or exudates. Teeth in good repair. Neck: No deformities, masses, or tenderness noted. Range of motion decreased. Thyroid normal. Lungs: Normal respiratory effort; chest expands symmetrically. Lungs are clear to auscultation without rales, wheezes, or increased work of breathing. Heart: Normal rate and rhythm. Normal S1 and S2. No gallop, click, or rub. No murmur. Abdomen: Bowel sounds normal; abdomen soft and nontender. No masses, organomegaly or hernias noted. Genitalia: deferred due to age                            Musculoskeletal/extremities: No deformity or scoliosis noted of  the thoracic or lumbar spine.   No clubbing, cyanosis, edema, or significant extremity  deformity noted. Range of motion normal .Tone & strength  Normal. Joints  reveal isolated flexion DIP change. Nail health good. Able to lie down & sit up w/o help. Negative SLR bilaterally Vascular: Carotid, radial artery,  and  posterior tibial pulses are  Equal.DPP decreased. No bruits present. Neurologic: Alert and oriented x3. Deep tendon reflexes symmetrical and 0-1/2+.   Light touch normal over feet.   Skin: Intact without suspicious lesions or rashes.Feet  cool. Lymph: No cervical, axillary lymphadenopathy present. Psych: Mood and affect are normal. Normally interactive                                                                                        Assessment & Plan:  #1 Medicare Wellness Exam; criteria met ; data entered #2 Problem List/Diagnoses reviewed Plan:  Assessments made/ Orders entered

## 2013-07-06 NOTE — Patient Instructions (Signed)
Share results with Dr Elmer Picker & all non  medical staff seen

## 2013-07-07 LAB — BASIC METABOLIC PANEL
BUN: 24 mg/dL — ABNORMAL HIGH (ref 6–23)
Calcium: 9.3 mg/dL (ref 8.4–10.5)
Creatinine, Ser: 1 mg/dL (ref 0.4–1.5)
GFR: 72.04 mL/min (ref >60.00)
Glucose, Bld: 170 mg/dL — ABNORMAL HIGH (ref 70–99)
Potassium: 4.8 mEq/L (ref 3.5–5.1)

## 2013-07-07 LAB — HEPATIC FUNCTION PANEL
Albumin: 4.2 g/dL (ref 3.5–5.2)
Total Bilirubin: 0.9 mg/dL (ref 0.3–1.2)

## 2013-07-07 LAB — LIPID PANEL
Cholesterol: 104 mg/dL (ref 0–200)
HDL: 48.8 mg/dL (ref >39.00)
LDL Cholesterol: 48 mg/dL (ref 0–99)
Triglycerides: 38 mg/dL (ref 0.0–149.0)
VLDL: 7.6 mg/dL (ref 0.0–40.0)

## 2013-07-08 ENCOUNTER — Encounter: Payer: Self-pay | Admitting: *Deleted

## 2013-07-08 NOTE — Progress Notes (Signed)
Letter mailed to patient.

## 2013-07-09 ENCOUNTER — Other Ambulatory Visit: Payer: Self-pay | Admitting: Internal Medicine

## 2013-07-11 ENCOUNTER — Other Ambulatory Visit: Payer: Self-pay | Admitting: *Deleted

## 2013-07-11 MED ORDER — SIMVASTATIN 10 MG PO TABS: ORAL_TABLET | ORAL | Status: DC

## 2013-07-11 NOTE — Telephone Encounter (Signed)
Simvastatin refill sent to pharmacy 

## 2013-08-26 ENCOUNTER — Other Ambulatory Visit: Payer: Self-pay | Admitting: Internal Medicine

## 2013-08-26 NOTE — Telephone Encounter (Signed)
Amlodipine refilled per protocol 

## 2013-09-15 ENCOUNTER — Other Ambulatory Visit: Payer: Self-pay | Admitting: Gastroenterology

## 2013-09-19 ENCOUNTER — Other Ambulatory Visit: Payer: Self-pay | Admitting: Gastroenterology

## 2013-10-03 ENCOUNTER — Encounter: Payer: Self-pay | Admitting: Gastroenterology

## 2013-10-03 ENCOUNTER — Ambulatory Visit (INDEPENDENT_AMBULATORY_CARE_PROVIDER_SITE_OTHER): Payer: Medicare Other | Admitting: Gastroenterology

## 2013-10-03 VITALS — BP 112/66 | HR 72 | Ht 68.5 in | Wt 182.1 lb

## 2013-10-03 DIAGNOSIS — K219 Gastro-esophageal reflux disease without esophagitis: Secondary | ICD-10-CM

## 2013-10-03 MED ORDER — OMEPRAZOLE 40 MG PO CPDR: 40.0000 mg | DELAYED_RELEASE_CAPSULE | Freq: Two times a day (BID) | ORAL | Status: DC

## 2013-10-03 NOTE — Progress Notes (Signed)
    History of Present Illness: This is an 80 year old male with GERD is well controlled on daily omeprazole. He has rare episodes of breakthrough symptoms generally related to dietary stressors. He has occasional mild constipation that is easily controlled with stool softeners as needed.   Current Medications, Allergies, Past Medical History, Past Surgical History, Family History and Social History were reviewed in [employer] record.  Physical Exam: General: Well developed , well nourished, no acute distress Head: Normocephalic and atraumatic Eyes:  sclerae anicteric, EOMI Ears: Normal auditory acuity Mouth: No deformity or lesions Lungs: Clear throughout to auscultation Heart: Regular rate and rhythm; no murmurs, rubs or bruits Abdomen: Soft, non tender and non distended. No masses, hepatosplenomegaly or hernias noted. Normal Bowel sounds Musculoskeletal: Symmetrical with no gross deformities  Pulses:  Normal pulses noted Extremities: No clubbing, cyanosis, edema or deformities noted Neurological: Alert oriented x 4, grossly nonfocal Psychological:  Alert and cooperative. Normal mood and affect  Assessment and Recommendations:  1. GERD. Continue omeprazole 40 mg daily and standard antireflux measures.  2. Mild constipation. Continue stool softeners as needed.

## 2013-10-03 NOTE — Patient Instructions (Addendum)
We have sent the following medications to your pharmacy for you to pick up at your convenience: Omeprazole.  Can return to Primary Care Physician for future follow up and medication refills or follow up with [place] as needed.   Thank you for choosing me and [facility].  Pricilla Riffle. Dagoberto Ligas., MD., Marval Regal

## 2013-10-19 ENCOUNTER — Encounter: Payer: Self-pay | Admitting: Internal Medicine

## 2013-11-07 ENCOUNTER — Encounter: Payer: Self-pay | Admitting: Gastroenterology

## 2013-11-07 ENCOUNTER — Ambulatory Visit (INDEPENDENT_AMBULATORY_CARE_PROVIDER_SITE_OTHER): Payer: TRICARE For Life (TFL) | Admitting: Gastroenterology

## 2013-11-07 VITALS — BP 128/68 | HR 68 | Ht 68.5 in | Wt 181.0 lb

## 2013-11-07 DIAGNOSIS — K219 Gastro-esophageal reflux disease without esophagitis: Secondary | ICD-10-CM

## 2013-11-07 DIAGNOSIS — K5900 Constipation, unspecified: Secondary | ICD-10-CM

## 2013-11-07 NOTE — Patient Instructions (Signed)
Continue Miralax mixing 17 grams in 8 oz of water 1-2 x daily. You can also add Milk of Magnesia every third day to your regimen to help with constipation.  Thank you for choosing me and [facility].  Pricilla Riffle. Dagoberto Ligas., MD., Marval Regal

## 2013-11-07 NOTE — Progress Notes (Signed)
    History of Present Illness: This is an 80 year old male with a long history of constipation. He underwent colonoscopy in June 2011 showing 1 adenomatous colon polyp and internal hemorrhoids. He's been maintained on laxatives and stool softeners over the years. I saw him in followup last month and at that time his constipation was adequately controlled on daily stool softener. His constipation worsened over the past several weeks when he traveled to [place] and he used Ex-lax, Ducolax, milk of magnesia and most recently daily MiraLax. He states milk of magnesia was effective and daily MiraLax has been very effective for the past several days. His reflux symptoms are well-controlled.  Current Medications, Allergies, Past Medical History, Past Surgical History, Family History and Social History were reviewed in [employer] record.  Physical Exam: General: Well developed , well nourished, no acute distress Head: Normocephalic and atraumatic Eyes:  sclerae anicteric, EOMI Ears: Normal auditory acuity Mouth: No deformity or lesions Lungs: Clear throughout to auscultation Heart: Regular rate and rhythm; no murmurs, rubs or bruits Abdomen: Soft, non tender and non distended. No masses, hepatosplenomegaly or hernias noted. Normal Bowel sounds Musculoskeletal: Symmetrical with no gross deformities  Pulses:  Normal pulses noted Extremities: No clubbing, cyanosis, edema or deformities noted Neurological: Alert oriented x 4, grossly nonfocal Psychological:  Alert and cooperative. Anxious.  Assessment and Recommendations:  1. GERD. Continue omeprazole 40 mg daily and standard antireflux measures. He should have ongoing follow up for this problem with his PCP. GI referral if this problem becomes difficult to manage.  2. Mild constipation. I reassured him that his constipation has been adequately evaluated in the past-we discussed his colonoscopy in  2011. I reminded him that he  has had to use laxatives on a regular basis many times over the past several years and I would expect he would have either ongoing or intermittent problems with constipation for the rest of his life. I recommended he use MiraLax once or twice daily as a long-term medication to help prevent constipation and use milk of magnesia about every 3 days if he doesn't have a bowel movement with MiraLax. He should have ongoing follow up for this problem with his PCP. GI referral if this problem becomes difficult to manage.  3. Personal history of adenomatous colon polyps. No plans for future surveillance colonoscopies due to age.

## 2013-11-10 ENCOUNTER — Other Ambulatory Visit: Payer: Self-pay | Admitting: Dermatology

## 2013-12-23 ENCOUNTER — Other Ambulatory Visit: Payer: Self-pay | Admitting: Internal Medicine

## 2013-12-28 ENCOUNTER — Encounter: Payer: Self-pay | Admitting: Internal Medicine

## 2013-12-28 ENCOUNTER — Other Ambulatory Visit (INDEPENDENT_AMBULATORY_CARE_PROVIDER_SITE_OTHER): Payer: Medicare Other

## 2013-12-28 ENCOUNTER — Ambulatory Visit (INDEPENDENT_AMBULATORY_CARE_PROVIDER_SITE_OTHER): Payer: Medicare Other | Admitting: Internal Medicine

## 2013-12-28 VITALS — BP 140/70 | HR 55 | Temp 97.4°F | Resp 14 | Wt 183.2 lb

## 2013-12-28 DIAGNOSIS — E1149 Type 2 diabetes mellitus with other diabetic neurological complication: Secondary | ICD-10-CM

## 2013-12-28 DIAGNOSIS — Z8679 Personal history of other diseases of the circulatory system: Secondary | ICD-10-CM

## 2013-12-28 DIAGNOSIS — F411 Generalized anxiety disorder: Secondary | ICD-10-CM

## 2013-12-28 LAB — MICROALBUMIN / CREATININE URINE RATIO
Creatinine,U: 225.9 mg/dL
MICROALB UR: 0.9 mg/dL (ref 0.0–1.9)
Microalb Creat Ratio: 0.4 mg/g (ref 0.0–30.0)

## 2013-12-28 LAB — HEMOGLOBIN A1C: Hgb A1c MFr Bld: 7.2 % — ABNORMAL HIGH (ref 4.6–6.5)

## 2013-12-28 MED ORDER — TRAMADOL HCL 50 MG PO TABS: 50.0000 mg | ORAL_TABLET | Freq: Three times a day (TID) | ORAL | Status: DC | PRN

## 2013-12-28 MED ORDER — LORAZEPAM 0.5 MG PO TABS: 0.5000 mg | ORAL_TABLET | Freq: Two times a day (BID) | ORAL | Status: DC | PRN

## 2013-12-28 NOTE — Progress Notes (Signed)
   Subjective:    Patient ID: Brett Franklin, male    DOB: 18-May-1926, 80 y.o.   MRN: [id number]  HPI FASTING HYPERGLYCEMIA OR Diabetes : Last A1C 05/11/13 = 7.1; Last microalbumin 09/08/12 = 0.4  FBS range/average: 130  Highest 2 hr post meal glucose: not measured  Medication compliance: Janumet  Hypoglycemia: none  Ophthamology care: 3 months ago; implant - 20/20 vision now  Podiatry care: none  Review of Systems A carb modified healthy diet is followed; exercise encompasses walking 2 miles/day (approx. 45 minutes) 7 times per week without symptoms.  Specifically denies the following:  Chest pain, palpitations: none  Dyspnea: none  Edema:none  Claudication: none  Lightheadedness,Syncope:none  Weight gain/loss: none  Polyuria/phagia/dipsia: none  Blurred vision /diplopia/lossof vision: none  Limb numbness/tingling/burning:none  Non healing skin lesions:none  Abd pain, bowel changes: none  Myalgias: none  Memory loss: no Anxiety well controlled ;Lorazepam taken < 5X/year     Objective:   Physical Exam Appears healthy and well-nourished & in no acute distress. Appears much younger than stated age.  No carotid bruits are present.No neck pain distention present at 10 - 15 degrees. Thyroid normal to palpation.  Heart rhythm and rate are normal with no gallop or murmur.  Chest is clear with no increased work of breathing.  There is no evidence of aortic aneurysm or renal artery bruits. Thyroid palpable.  Abdomen soft with no organomegaly or masses. No HJR.  No clubbing, cyanosis or edema present.  Pedal pulses are intact.  No ischemic skin changes are present . Fingernails/ toenails healthy. Flexion toe changes Alert and oriented. Strength, tone, DTRs reflexes normal.     Assessment & Plan:  #1 T2DM - A1C, microalbumin then assess meds. Encourage continued exercise.  See plan

## 2013-12-28 NOTE — Assessment & Plan Note (Signed)
A1c & urine microalbumin   nutritional & exercise interventions as discussed.

## 2013-12-28 NOTE — Progress Notes (Signed)
Pre visit review using our clinic review tool, if applicable. No additional management support is needed unless otherwise documented below in the visit note. 

## 2013-12-28 NOTE — Patient Instructions (Signed)
Your next office appointment will be determined based upon review of your pending labs . Those instructions will be transmitted to you  by mail. 

## 2013-12-28 NOTE — Assessment & Plan Note (Signed)
Lorazepam refill

## 2013-12-28 NOTE — Assessment & Plan Note (Signed)
Continue full dose ASA

## 2013-12-28 NOTE — Progress Notes (Signed)
   Subjective:    Patient ID: Brett Franklin, male    DOB: [birth year], 80 y.o.   MRN: [id number]  HPI  Here for DM management. Denies any acute issues at this time.   FASTING HYPERGLYCEMIA OR Diabetes : Last A1C 05/11/13 = 7.1; Last microalbumin 09/08/12 = 0.4 FBS range/average: 130 Highest 2 hr post meal glucose: not measured Medication compliance: Janumet  Hypoglycemia:  none Ophthamology care: 3 months ago; implant - 20/20 vision now Podiatry care: none   Review of Systems A carb modified healthy diet is followed; exercise encompasses walking 2 miles/day (approx. 45 minutes) 7  times per week without symptoms.   Specifically denies the following:  Chest pain, palpitations:  none     Dyspnea: none Edema:none Claudication: none Lightheadedness,Syncope:none Weight gain/loss: none Polyuria/phagia/dipsia: none    Blurred vision /diplopia/lossof vision: none Limb numbness/tingling/burning:none Non healing skin lesions:none Abd pain, bowel changes: none  Myalgias: none Memory loss: none    Objective:   Physical Exam Appears healthy and well-nourished & in no acute distress. Appears much younger than stated age.  No carotid bruits are present.No neck pain distention present at 10 - 15 degrees. Thyroid normal to palpation. Heart rhythm and rate are normal with no gallop or murmur. Chest is clear with no increased work of breathing. There is no evidence of aortic aneurysm or renal artery bruits. Thyroid palpable.  Abdomen soft with no organomegaly or masses. No HJR. No clubbing, cyanosis or edema present. Pedal pulses are intact.  No ischemic skin changes are present . Fingernails/ toenails healthy. Alert and oriented. Strength, tone, DTRs reflexes normal.    Assessment & Plan:  #1 T2DM - A1C, microalbumin then assess meds. Encourage continued exercise.

## 2013-12-29 ENCOUNTER — Encounter: Payer: Self-pay | Admitting: *Deleted

## 2013-12-29 NOTE — Progress Notes (Signed)
Letter sent to patient with test results

## 2014-01-04 ENCOUNTER — Other Ambulatory Visit: Payer: Self-pay | Admitting: *Deleted

## 2014-01-04 MED ORDER — METOPROLOL TARTRATE 25 MG PO TABS: ORAL_TABLET | ORAL | Status: DC

## 2014-02-08 ENCOUNTER — Other Ambulatory Visit: Payer: Self-pay | Admitting: Internal Medicine

## 2014-02-13 ENCOUNTER — Other Ambulatory Visit: Payer: Self-pay

## 2014-02-13 MED ORDER — AMLODIPINE BESYLATE 5 MG PO TABS: ORAL_TABLET | ORAL | Status: DC

## 2014-02-15 ENCOUNTER — Ambulatory Visit (INDEPENDENT_AMBULATORY_CARE_PROVIDER_SITE_OTHER): Payer: Medicare Other | Admitting: Internal Medicine

## 2014-02-15 ENCOUNTER — Encounter: Payer: Self-pay | Admitting: Internal Medicine

## 2014-02-15 VITALS — BP 124/70 | HR 74 | Temp 97.0°F | Wt 182.8 lb

## 2014-02-15 DIAGNOSIS — J209 Acute bronchitis, unspecified: Secondary | ICD-10-CM

## 2014-02-15 MED ORDER — AZITHROMYCIN 250 MG PO TABS: ORAL_TABLET | ORAL | Status: DC

## 2014-02-15 MED ORDER — HYDROCODONE-HOMATROPINE 5-1.5 MG/5ML PO SYRP: 5.0000 mL | ORAL_SOLUTION | Freq: Four times a day (QID) | ORAL | Status: DC | PRN

## 2014-02-15 NOTE — Progress Notes (Signed)
   Subjective:    Patient ID: Brett Franklin, male    DOB: 1926/03/13, 80 y.o.   MRN: [id number]  HPI When did you cough start: Onset around May 6th Was there any suspected cause such as exposure to pollen, dust, chemicals fumes, smoke, or to sick individuals: None Character of the cough : Infrequently productive with yellowish thick sputum, last productive cough was 5/17, describes as rattly Treatment for cough: Syrup of unknown name from [facility] bought by wife, taken once, did not feel that it helped Response to treatment: None  Itchy, watery eyes, sneezing: None Fever, chills, sweats: None Discolored nasal secretions or discolored chest secretions: Yellowish chest secretions, denies nasal secretions Pain in the sinuses above the eyes or in the cheeks  below the eyes, postnasal drainage: Denies except for mild pressure behind the eyes Pain when taking a deep breath, coughing up blood, shortness of breath, wheezing: None Heart burn or reflux symptoms: None; controlled with Omeprazole twice daily   Smoking history:  Past medical history of asthma, bronchitis, emphysema, tuberculosis or lung cancer in you  Any of these conditions in your family: None  Are you taking any blood pressure medicines called ACE inhibitors (example benazepril, lisinopril, ramipril): None; currently taking Amlodipine Besylate and Metoprolol Tartrate     Review of Systems    Objective:   Physical Exam        Assessment & Plan:

## 2014-02-15 NOTE — Progress Notes (Signed)
   Subjective:    Patient ID: Brett Franklin, male    DOB: [birth year], 80 y.o.   MRN: [id number]  HPI   The cough began approximately May 6. There were no specific triggers such as  exposure to ill individuals, pollen, dust, chemicals, or smoke.  The cough is infrequently productive of thick yellow sputum. The last time sputum was produced occured 5/17. The cough mainly now is rattly.  He is using an over-the-counter cough syrup without significant benefit  He has had some discomfort in the sinuses above the eyes but none in the cheeks. He's had no postnasal drainage.  He does not describe pleuritic-type chest pain.  There's been no associated shortness of breath and wheezing.  No PMH of asthma . He has never smoked. He is not on ACE inhibitor. This had caused cough in the past.      Review of Systems  He denies extrinsic symptoms of itchy, watery eyes, sneezing  He has no fever, chills, or sweats.  Dyspepsia/reflux are not significant. He is on omeprazole twice a day.    There is no nasal purulence.       Objective:   Physical Exam General appearance:good health ;well nourished; no acute distress or increased work of breathing is present.  No  lymphadenopathy about the head, neck, or axilla noted. Appears younger than stated age  Eyes: No conjunctival inflammation or lid edema is present. There is no scleral icterus.  Ears:  External ear exam shows no significant lesions or deformities.  Otoscopic examination reveals clear canals, tympanic membranes are intact bilaterally without bulging, retraction, inflammation or discharge.  Nose:  External nasal examination shows no deformity or inflammation. Nasal mucosa are dry without lesions or exudates. No septal dislocation or deviation.No obstruction to airflow.   Oral exam: Dental hygiene is good; lips and gums are healthy appearing.There is no oropharyngeal erythema or exudate noted.   Neck:  No deformities, thyromegaly,  masses, or tenderness noted.   Supple with full range of motion without pain.   Heart:  Normal rate and regular rhythm. S1 and S2 normal without gallop, murmur, click, rub or other extra sounds.   Lungs:Chest clear to auscultation; no wheezes, rhonchi,rales ,or rubs present.No increased work of breathing.    Extremities:  No cyanosis, edema, or clubbing  noted    Skin: Warm & dry          Assessment & Plan:  #1 acute bronchitis w/o bronchospasm #2 no sinusitis Plan: See orders and recommendations

## 2014-02-15 NOTE — Progress Notes (Signed)
Pre visit review using our clinic review tool, if applicable. No additional management support is needed unless otherwise documented below in the visit note. 

## 2014-02-15 NOTE — Patient Instructions (Signed)
Carry room temperature water and sip liberally after coughing. 

## 2014-06-08 ENCOUNTER — Encounter: Payer: Self-pay | Admitting: Gastroenterology

## 2014-06-30 ENCOUNTER — Other Ambulatory Visit: Payer: Self-pay

## 2014-06-30 MED ORDER — METOPROLOL TARTRATE 25 MG PO TABS: ORAL_TABLET | ORAL | Status: DC

## 2014-07-17 ENCOUNTER — Encounter: Payer: Medicare Other | Admitting: Internal Medicine

## 2014-07-18 ENCOUNTER — Ambulatory Visit (INDEPENDENT_AMBULATORY_CARE_PROVIDER_SITE_OTHER): Payer: Medicare Other | Admitting: Internal Medicine

## 2014-07-18 ENCOUNTER — Other Ambulatory Visit (INDEPENDENT_AMBULATORY_CARE_PROVIDER_SITE_OTHER): Payer: Medicare Other

## 2014-07-18 ENCOUNTER — Other Ambulatory Visit: Payer: Self-pay

## 2014-07-18 ENCOUNTER — Encounter: Payer: Self-pay | Admitting: Internal Medicine

## 2014-07-18 VITALS — BP 132/80 | HR 72 | Temp 97.9°F | Resp 14 | Ht 69.25 in | Wt 181.2 lb

## 2014-07-18 DIAGNOSIS — Z86010 Personal history of colonic polyps: Secondary | ICD-10-CM

## 2014-07-18 DIAGNOSIS — E785 Hyperlipidemia, unspecified: Secondary | ICD-10-CM

## 2014-07-18 DIAGNOSIS — E1149 Type 2 diabetes mellitus with other diabetic neurological complication: Secondary | ICD-10-CM

## 2014-07-18 DIAGNOSIS — I10 Essential (primary) hypertension: Secondary | ICD-10-CM

## 2014-07-18 DIAGNOSIS — Z23 Encounter for immunization: Secondary | ICD-10-CM

## 2014-07-18 LAB — CBC WITH DIFFERENTIAL/PLATELET
BASOS PCT: 0.2 % (ref 0.0–3.0)
Basophils Absolute: 0 10*3/uL (ref 0.0–0.1)
EOS ABS: 0.1 10*3/uL (ref 0.0–0.7)
Eosinophils Relative: 0.6 % (ref 0.0–5.0)
HEMATOCRIT: 47.4 % (ref 39.0–52.0)
HEMOGLOBIN: 15.5 g/dL (ref 13.0–17.0)
Lymphocytes Relative: 12.5 % (ref 12.0–46.0)
Lymphs Abs: 1.8 10*3/uL (ref 0.7–4.0)
MCHC: 32.7 g/dL (ref 30.0–36.0)
MCV: 98.5 fl (ref 78.0–100.0)
Monocytes Absolute: 1 10*3/uL (ref 0.1–1.0)
Monocytes Relative: 6.8 % (ref 3.0–12.0)
NEUTROS ABS: 11.5 10*3/uL — AB (ref 1.4–7.7)
Neutrophils Relative %: 79.9 % — ABNORMAL HIGH (ref 43.0–77.0)
Platelets: 219 10*3/uL (ref 150.0–400.0)
RBC: 4.81 Mil/uL (ref 4.22–5.81)
RDW: 14 % (ref 11.5–15.5)
WBC: 14.3 10*3/uL — ABNORMAL HIGH (ref 4.0–10.5)

## 2014-07-18 LAB — BASIC METABOLIC PANEL
BUN: 25 mg/dL — ABNORMAL HIGH (ref 6–23)
CHLORIDE: 104 meq/L (ref 96–112)
CO2: 20 meq/L (ref 19–32)
Calcium: 9.3 mg/dL (ref 8.4–10.5)
Creatinine, Ser: 1.1 mg/dL (ref 0.4–1.5)
GFR: 65.3 mL/min (ref >60.00)
Glucose, Bld: 148 mg/dL — ABNORMAL HIGH (ref 70–99)
POTASSIUM: 4.1 meq/L (ref 3.5–5.1)
SODIUM: 138 meq/L (ref 135–145)

## 2014-07-18 LAB — HEPATIC FUNCTION PANEL
ALK PHOS: 54 U/L (ref 39–117)
ALT: 18 U/L (ref 0–53)
AST: 21 U/L (ref 0–37)
Albumin: 3.8 g/dL (ref 3.5–5.2)
BILIRUBIN DIRECT: 0.1 mg/dL (ref 0.0–0.3)
TOTAL PROTEIN: 7.4 g/dL (ref 6.0–8.3)
Total Bilirubin: 0.9 mg/dL (ref 0.2–1.2)

## 2014-07-18 LAB — TSH: TSH: 3.94 u[IU]/mL (ref 0.35–4.50)

## 2014-07-18 LAB — LIPID PANEL
CHOL/HDL RATIO: 2
Cholesterol: 79 mg/dL (ref 0–200)
HDL: 40.5 mg/dL (ref >39.00)
LDL Cholesterol: 27 mg/dL (ref 0–99)
NONHDL: 38.5
Triglycerides: 58 mg/dL (ref 0.0–149.0)
VLDL: 11.6 mg/dL (ref 0.0–40.0)

## 2014-07-18 LAB — HEMOGLOBIN A1C: Hgb A1c MFr Bld: 7.2 % — ABNORMAL HIGH (ref 4.6–6.5)

## 2014-07-18 LAB — MICROALBUMIN / CREATININE URINE RATIO
Creatinine,U: 214.2 mg/dL
MICROALB UR: 0.7 mg/dL (ref 0.0–1.9)
Microalb Creat Ratio: 0.3 mg/g (ref 0.0–30.0)

## 2014-07-18 MED ORDER — TRAMADOL HCL 50 MG PO TABS: 50.0000 mg | ORAL_TABLET | Freq: Three times a day (TID) | ORAL | Status: DC | PRN

## 2014-07-18 NOTE — Telephone Encounter (Signed)
Last office visit 07-18-14

## 2014-07-18 NOTE — Progress Notes (Signed)
Pre visit review using our clinic review tool, if applicable. No additional management support is needed unless otherwise documented below in the visit note. 

## 2014-07-18 NOTE — Assessment & Plan Note (Signed)
Lipids, LFTs, TSH  

## 2014-07-18 NOTE — Progress Notes (Signed)
Subjective:    Patient ID: Brett Franklin, male    DOB: Nov 21, 1925, 80 y.o.   MRN: [id number]  HPI   He is here for  followup of hypertension, diabetes, and dyslipidemia.  He has been compliant with his medications; he denies any adverse effects  He is on a heart healthy ,low-salt diet  He walks 1.5-2 miles per day without cardiopulmonary symptoms.  FBS 130-135; no hypoglycemia. BP controlled ; < 140/90. Ophthalmologic exam is up-to-date; he has no retinopathy  He does have a history of colon polyps he denies any active GI symptoms.   He continues to have sleep issues as going to sleep & staying asleep    Review of Systems  All below NEGATIVE: Chest pain, palpitations       Dyspnea Edema Claudication  Lightheadedness,Syncope Weight change Polyuria/phagia/dipsia    Blurred vision /diplopia/lossof vision Limb numbness/tingling/burning Non healing skin lesions Abd pain, bowel changes   Myalgias  Memory loss     Objective:   Physical Exam    Positive or pertinent findings include: He has bilateral hearing aids. Slow S4 without murmur or gallop He is barrel chested. There hammer toe changes greater on left than right. Pedal pulses are decreased ,particularly the dorsalis pedis pulses.  Gen.: Healthy and well-nourished in appearance. Alert, appropriate and cooperative throughout exam. Appears younger than stated age  Head: Normocephalic without obvious abnormalities  Eyes: No corneal or conjunctival inflammation noted. Pupils equal round reactive to light and accommodation. Extraocular motion intact. Ears: External  ear exam reveals no significant lesions or deformities. Canals clear .TMs normal. Hearing is grossly normal bilaterally. Nose: External nasal exam reveals no deformity or inflammation. Nasal mucosa are pink and moist. No lesions or exudates noted.    Mouth: Oral mucosa and oropharynx reveal no lesions or exudates. Teeth in good repair. Neck: No  deformities, masses, or tenderness noted. Range of motion & Thyroid normal. Lungs: Normal respiratory effort; chest expands symmetrically. Lungs are clear to auscultation without rales, wheezes, or increased work of breathing. Heart: Normal rate and rhythm. Normal S1 and S2. No gallop, click, or rub. No murmur. Abdomen: Bowel sounds normal; abdomen soft and nontender. No masses, organomegaly or hernias noted. Genitalia: as per Urology                               Musculoskeletal/extremities: No deformity or scoliosis noted of  the thoracic or lumbar spine.  No clubbing, cyanosis, edema, or significant extremity  deformity noted. Range of motion normal .Tone & strength normal. Hand joints normal Fingernail / toenail health good. Able to lie down & sit up w/o help. Negative SLR bilaterally Vascular: Carotid, radial artery, dorsalis pedis and  posterior tibial pulses are equal. No bruits present. Neurologic: Alert and oriented x3. Deep tendon reflexes symmetrical but 1/2 + @ knees Gait normal.   Skin: Intact without suspicious lesions or rashes.Striae over abdomen Lymph: No cervical, axillary lymphadenopathy present. Psych: Mood and affect are normal. Normally interactive  Assessment & Plan:  See Current Assessment & Plan in Problem List under specific DiagnosisThe labs will be reviewed and risks and options assessed. Written recommendations will be provided by mail or directly through My Chart.Further evaluation or change in medical therapy will be directed by those results.

## 2014-07-18 NOTE — Assessment & Plan Note (Signed)
A1c , urine microalbumin, BMET 

## 2014-07-18 NOTE — Patient Instructions (Signed)
Your next office appointment will be determined based upon review of your pending labs . Those instructions will be transmitted to you by mail.  To prevent sleep dysfunction follow these instructions for sleep hygiene. Do not read, watch TV, or eat in bed. Do not get into bed until you are ready to turn off the light &  to go to sleep. Do not ingest stimulants ( decongestants, diet pills, nicotine, caffeine) after the evening meal.Do not take daytime naps.Cardiovascular exercise, this can be as simple a program as walking, is recommended 30-45 minutes 3-4 times per week. If you're not exercising you should take 6-8 weeks to build up to this level.

## 2014-07-18 NOTE — Telephone Encounter (Signed)
[facility]adol called [facility]  

## 2014-07-18 NOTE — Assessment & Plan Note (Signed)
Blood pressure goals reviewed. BMET 

## 2014-07-18 NOTE — Assessment & Plan Note (Signed)
CBC

## 2014-07-18 NOTE — Telephone Encounter (Signed)
OK #[id number] 

## 2014-07-19 ENCOUNTER — Other Ambulatory Visit: Payer: Self-pay | Admitting: Internal Medicine

## 2014-07-19 ENCOUNTER — Telehealth: Payer: Self-pay | Admitting: Internal Medicine

## 2014-07-19 NOTE — Telephone Encounter (Signed)
emmi mailed  °

## 2014-08-07 ENCOUNTER — Other Ambulatory Visit: Payer: Self-pay

## 2014-08-07 MED ORDER — AMLODIPINE BESYLATE 5 MG PO TABS: ORAL_TABLET | ORAL | Status: DC

## 2014-08-25 ENCOUNTER — Other Ambulatory Visit: Payer: Self-pay | Admitting: Internal Medicine

## 2014-10-07 ENCOUNTER — Ambulatory Visit (INDEPENDENT_AMBULATORY_CARE_PROVIDER_SITE_OTHER): Payer: Medicare Other | Admitting: Family Medicine

## 2014-10-07 ENCOUNTER — Encounter: Payer: Self-pay | Admitting: Family Medicine

## 2014-10-07 VITALS — BP 120/68 | HR 64 | Temp 97.8°F | Resp 18 | Ht 69.25 in | Wt 181.0 lb

## 2014-10-07 DIAGNOSIS — M7022 Olecranon bursitis, left elbow: Secondary | ICD-10-CM | POA: Diagnosis not present

## 2014-10-07 MED ORDER — PREDNISONE 20 MG PO TABS: 20.0000 mg | ORAL_TABLET | Freq: Every day | ORAL | Status: DC

## 2014-10-07 NOTE — Patient Instructions (Signed)
Prednisone 20mg  for 7 days (may raise your blood sugar some). See Dr. Linna Darner or someone in this office early next week for a check in. If your swelling does not improve, we can always consider again pulling fluid off the elbow. Does not appear infected but if has expanding redness or warmth or fevers may need to consider antibiotics.   Use ice 3x a day for 15 minutes to try to calm the swelling.

## 2014-10-07 NOTE — Progress Notes (Signed)
[facility] PCP: Unice Cobble, MD  Subjective:  Brett Franklin is a 81 y.o. year old very pleasant male patient who presents left elbow pain and swelling for 2-3 days. Yesterday was red, swollen and warm. He iced it once yesterday and seemed to decrease swelling today but certainly still present. Not clear of history of falls or hitting elbow (did have a fall on upper right arm about a week ago). Symptoms seem to be stable to mildly improving. No other treatments tried.   ROS- No fevers/chills/expanding redness/nausea/vomiting.   Pertinent Past Medical History- BPH, hx CVA, DM (reasonable control a1c 7.2), HTN. Denies history of gout.   Medications- reviewed  Current Outpatient Prescriptions  Medication Sig Dispense Refill  . acetaminophen (TYLENOL) 325 MG tablet Take 650 mg by mouth as needed.    Marland Kitchen amLODipine (NORVASC) 5 MG tablet TAKE 1 TABLET BY MOUTH EVERY DAY 90 tablet 1  . aspirin 325 MG tablet Take 325 mg by mouth daily.      Marland Kitchen JANUMET 50-500 MG per tablet TAKE 1 TABLET EVERY DAY 90 tablet 2  . LORazepam (ATIVAN) 0.5 MG tablet Take 1 tablet (0.5 mg total) by mouth every 12 (twelve) hours as needed for anxiety. 30 tablet 1  . metoprolol tartrate (LOPRESSOR) 25 MG tablet TAKE 1/2 TAB EVERY 12HRS INCREASE TO 1 TAB TWICE DAILY IF BLOOD PRESSURE STAYS ABOVE 140/90 90 tablet 1  . Multiple Vitamins-Minerals (MACULAR VITAMIN BENEFIT PO) Take by mouth 2 (two) times daily.    Marland Kitchen omeprazole (PRILOSEC) 40 MG capsule Take 1 capsule (40 mg total) by mouth 2 (two) times daily. 180 capsule 3  . simvastatin (ZOCOR) 10 MG tablet 1/2 qhs 72 tablet 1  . traMADol (ULTRAM) 50 MG tablet Take 1 tablet (50 mg total) by mouth every 8 (eight) hours as needed. 30 tablet 0   No current facility-administered medications for this visit.    Objective: BP 120/68 mmHg  Pulse 64  Temp(Src) 97.8 F (36.6 C) (Oral)  Resp 18  Ht 5' 9.25" (1.759 m)  Wt 181 lb (82.101 kg)  BMI 26.53  kg/m2 Gen: NAD, resting comfortably MSK: Left elbow normal Left elbow limited in last 30 degrees of extension. Over elbow, fluid filled area which is warm, erythematous, and tender to touch.  No expanding redness up arm.    Assessment/Plan:  Left Olecranon Bursitis Offered aspiration ( would not be able to culture or send for cyrstal analysis in Saturday clinic and could re-accumulate) and patient declined. Trial of prednisone x 7 days (known DM but a1c reasonable at 7.2) with follow up with Dr. Linna Darner on Monday. No expanding redness to suggest infection as cause but in differential. I suspect patient irritated the area when he hit his right arm last week. Ice 3x a day for 15 minutes as well. Return precautions advised. Tylenol as needed for pain in addition to ice/prednisone.   Meds ordered this encounter  Medications  . acetaminophen (TYLENOL) 325 MG tablet    Sig: Take 650 mg by mouth as needed.  . predniSONE (DELTASONE) 20 MG tablet    Sig: Take 1 tablet (20 mg total) by mouth daily with breakfast.    Dispense:  7 tablet    Refill:  0

## 2014-10-07 NOTE — Progress Notes (Signed)
Pre visit review using our clinic review tool, if applicable. No additional management support is needed unless otherwise documented below in the visit note. 

## 2014-10-12 ENCOUNTER — Ambulatory Visit (INDEPENDENT_AMBULATORY_CARE_PROVIDER_SITE_OTHER): Payer: Medicare Other | Admitting: Internal Medicine

## 2014-10-12 ENCOUNTER — Encounter: Payer: Self-pay | Admitting: Internal Medicine

## 2014-10-12 VITALS — BP 130/70 | HR 59 | Temp 97.5°F | Ht 69.25 in | Wt 182.5 lb

## 2014-10-12 DIAGNOSIS — L03119 Cellulitis of unspecified part of limb: Secondary | ICD-10-CM | POA: Diagnosis not present

## 2014-10-12 DIAGNOSIS — E1165 Type 2 diabetes mellitus with hyperglycemia: Secondary | ICD-10-CM | POA: Diagnosis not present

## 2014-10-12 DIAGNOSIS — Z23 Encounter for immunization: Secondary | ICD-10-CM

## 2014-10-12 DIAGNOSIS — M25422 Effusion, left elbow: Secondary | ICD-10-CM | POA: Diagnosis not present

## 2014-10-12 DIAGNOSIS — IMO0002 Reserved for concepts with insufficient information to code with codable children: Secondary | ICD-10-CM

## 2014-10-12 NOTE — Progress Notes (Signed)
Pre visit review using our clinic review tool, if applicable. No additional management support is needed unless otherwise documented below in the visit note. 

## 2014-10-12 NOTE — Patient Instructions (Signed)
The Orthopedic  referral has been scheduled @  3 pm today with Dr Gladstone Lighter.

## 2014-10-12 NOTE — Progress Notes (Signed)
   Subjective:    Patient ID: Brett Franklin, male    DOB: [birth year], 81 y.o.   MRN: [id number]  HPI   He began to notice soreness at the left elbow 10/06/14. He knew of no specific injury or trigger prior to symptoms.  He had fallen the week after Christmas taking down decorations and had  skinned the left triceps area.  He was seen in the Saturday clinic 1/9 and placed on prednisone 20 mg daily for 7 days. That may have decreased the swelling at the elbow but there's been no change in the redness or tenderness.  Prior to starting the prednisone his fasting blood sugars had been averaging 130. On the prednisone they have ranged 139-330. He has had random glucoses up to 384. His last A1c was 07/19/14 with a value of 7.2%  He denies a history of gout.  Review of Systems  He denies any fever, chills, or sweats. He's had no purulent drainage from the elbow.    Objective:   Physical Exam  Pertinent or positive findings include:  There is a raised erythematous area 7 x 6 cm over the left elbow. This is tender to touch. There is some decreased range of motion of the left elbow.  He has a healing scar 3.5 x 0.5 cm of the left triceps with no evidence of active cellulitis. There is no evidence of any lymphangitis between the 2 areas.  General appearance :adequately nourished; in no distress.  Eyes: No conjunctival inflammation or scleral icterus is present.   Heart:  Normal rate and regular rhythm. S1 and S2 normal without gallop, murmur, click, rub or other extra sounds     Lungs:Chest clear to auscultation; no wheezes, rhonchi,rales ,or rubs present.No increased work of breathing.   Vascular : all pulses equal ; no bruits present.  Skin:Warm & dry. No jaundice or tenting  Lymphatic: No lymphadenopathy is noted about the head, neck, axilla, or  epitrochlear areas.           Assessment & Plan:  #1 left olecranon effusion and cellulitis with osteomyelitis risk. Gout is less  likely in view of the suboptimal response to oral steroids   #2 diabetes, exacerbation by oral steroids  Plan: Orthopedic consult for drainage as soon as possible.

## 2014-11-30 ENCOUNTER — Other Ambulatory Visit: Payer: Self-pay | Admitting: Internal Medicine

## 2015-01-17 ENCOUNTER — Other Ambulatory Visit: Payer: Self-pay | Admitting: Internal Medicine

## 2015-01-27 ENCOUNTER — Other Ambulatory Visit: Payer: Self-pay | Admitting: Internal Medicine

## 2015-01-31 ENCOUNTER — Other Ambulatory Visit: Payer: Self-pay

## 2015-01-31 MED ORDER — AMLODIPINE BESYLATE 5 MG PO TABS: ORAL_TABLET | ORAL | Status: DC

## 2015-02-12 ENCOUNTER — Other Ambulatory Visit: Payer: Self-pay | Admitting: Internal Medicine

## 2015-02-12 ENCOUNTER — Ambulatory Visit (INDEPENDENT_AMBULATORY_CARE_PROVIDER_SITE_OTHER): Payer: Medicare Other | Admitting: Internal Medicine

## 2015-02-12 ENCOUNTER — Other Ambulatory Visit (INDEPENDENT_AMBULATORY_CARE_PROVIDER_SITE_OTHER): Payer: Medicare Other

## 2015-02-12 ENCOUNTER — Encounter: Payer: Self-pay | Admitting: Internal Medicine

## 2015-02-12 VITALS — BP 132/82 | HR 54 | Temp 97.6°F | Wt 181.5 lb

## 2015-02-12 DIAGNOSIS — Z86010 Personal history of colonic polyps: Secondary | ICD-10-CM

## 2015-02-12 DIAGNOSIS — E1149 Type 2 diabetes mellitus with other diabetic neurological complication: Secondary | ICD-10-CM

## 2015-02-12 DIAGNOSIS — I10 Essential (primary) hypertension: Secondary | ICD-10-CM

## 2015-02-12 DIAGNOSIS — E785 Hyperlipidemia, unspecified: Secondary | ICD-10-CM

## 2015-02-12 DIAGNOSIS — R7989 Other specified abnormal findings of blood chemistry: Secondary | ICD-10-CM

## 2015-02-12 LAB — CBC WITH DIFFERENTIAL/PLATELET
BASOS PCT: 0.6 % (ref 0.0–3.0)
Basophils Absolute: 0.1 10*3/uL (ref 0.0–0.1)
EOS PCT: 0.7 % (ref 0.0–5.0)
Eosinophils Absolute: 0.1 10*3/uL (ref 0.0–0.7)
HCT: 44.6 % (ref 39.0–52.0)
HEMOGLOBIN: 15 g/dL (ref 13.0–17.0)
Lymphocytes Relative: 13 % (ref 12.0–46.0)
Lymphs Abs: 1.4 10*3/uL (ref 0.7–4.0)
MCHC: 33.7 g/dL (ref 30.0–36.0)
MCV: 95.4 fl (ref 78.0–100.0)
MONOS PCT: 7 % (ref 3.0–12.0)
Monocytes Absolute: 0.7 10*3/uL (ref 0.1–1.0)
Neutro Abs: 8.3 10*3/uL — ABNORMAL HIGH (ref 1.4–7.7)
Neutrophils Relative %: 78.7 % — ABNORMAL HIGH (ref 43.0–77.0)
Platelets: 176 10*3/uL (ref 150.0–400.0)
RBC: 4.67 Mil/uL (ref 4.22–5.81)
RDW: 14.3 % (ref 11.5–15.5)
WBC: 10.6 10*3/uL — AB (ref 4.0–10.5)

## 2015-02-12 LAB — LIPID PANEL
Cholesterol: 97 mg/dL (ref 0–200)
HDL: 55.5 mg/dL (ref >39.00)
LDL Cholesterol: 31 mg/dL (ref 0–99)
NonHDL: 41.5
Total CHOL/HDL Ratio: 2
Triglycerides: 51 mg/dL (ref 0.0–149.0)
VLDL: 10.2 mg/dL (ref 0.0–40.0)

## 2015-02-12 LAB — TSH: TSH: 5.41 u[IU]/mL — ABNORMAL HIGH (ref 0.35–4.50)

## 2015-02-12 LAB — BASIC METABOLIC PANEL
BUN: 23 mg/dL (ref 6–23)
CO2: 25 mEq/L (ref 19–32)
CREATININE: 1.12 mg/dL (ref 0.40–1.50)
Calcium: 9.5 mg/dL (ref 8.4–10.5)
Chloride: 104 mEq/L (ref 96–112)
GFR: 65.89 mL/min (ref >60.00)
GLUCOSE: 167 mg/dL — AB (ref 70–99)
Potassium: 4.5 mEq/L (ref 3.5–5.1)
Sodium: 138 mEq/L (ref 135–145)

## 2015-02-12 LAB — HEPATIC FUNCTION PANEL
ALT: 11 U/L (ref 0–53)
AST: 15 U/L (ref 0–37)
Albumin: 4 g/dL (ref 3.5–5.2)
Alkaline Phosphatase: 50 U/L (ref 39–117)
BILIRUBIN DIRECT: 0.2 mg/dL (ref 0.0–0.3)
Total Bilirubin: 0.6 mg/dL (ref 0.2–1.2)
Total Protein: 6.9 g/dL (ref 6.0–8.3)

## 2015-02-12 LAB — HEMOGLOBIN A1C: HEMOGLOBIN A1C: 7.2 % — AB (ref 4.6–6.5)

## 2015-02-12 LAB — MICROALBUMIN / CREATININE URINE RATIO
Creatinine,U: 153.7 mg/dL
MICROALB/CREAT RATIO: 0.7 mg/g (ref 0.0–30.0)
Microalb, Ur: 1.1 mg/dL (ref 0.0–1.9)

## 2015-02-12 NOTE — Assessment & Plan Note (Signed)
Lipids, LFTs, TSH  

## 2015-02-12 NOTE — Assessment & Plan Note (Signed)
A1c, BMET 

## 2015-02-12 NOTE — Progress Notes (Signed)
   Subjective:    Patient ID: Brett Franklin, male    DOB: [birth year], 81 y.o.   MRN: [id number]  HPI The patient is here to assess status of active health conditions.  PMH, FH, & Social History reviewed & updated.  He has been compliant with his medicines without adverse effects. He is on a low-carb, no added salt diet. He walks 2 miles 7 days per week without cardiovascular symptoms.. Blood pressure averages less than 125/90 at home. He has never smoked and does not drink alcohol.  Fasting blood sugars average 140. While he was taken oral steroids in January of this year for his elbow is glucoses were as high as 350.  He does have a past medical history of colon polyps. He has no active GI symptoms. He has aged out of colonoscopic surveillance.     Review of Systems  Significant headaches, epistaxis, chest pain, palpitations, exertional dyspnea, claudication, paroxysmal nocturnal dyspnea, or edema absent. No GI symptoms , memory loss or myalgias  Unexplained weight loss, abdominal pain, significant dyspepsia, dysphagia, melena, rectal bleeding, or persistently small caliber stools are denied. Polyuria, polyphagia, polydipsia absent.  There is no blurred vision, double vision, or loss of vision.   No postural dizziness noted. Denied are numbness, tingling, or burning of the extremities.  No nonhealing skin lesions present.  Weight is stable.      Objective:   Physical Exam  Pertinent or positive findings include: He appears dramatically younger than his age.  He has bilateral ptosis. The pupils are small.  Striae present over the abdomen. Pedal pulses are decreased.  He has isolated DIP osteoarthritic changes.  He has some flexion contractures of the toes.  General appearance :adequately nourished; in no distress. Eyes: No conjunctival inflammation or scleral icterus is present. Oral exam:  Lips and gums are healthy appearing.There is no oropharyngeal erythema or exudate  noted. Dental hygiene is good. Heart:  Normal rate and regular rhythm. S1 and S2 normal without gallop, murmur, click, rub or other extra sounds   Lungs:Chest clear to auscultation; no wheezes, rhonchi,rales ,or rubs present.No increased work of breathing.  Abdomen: bowel sounds normal, soft and non-tender without masses, organomegaly or hernias noted.  No guarding or rebound.  Vascular : all pulses equal ; no bruits present. Skin:Warm & dry.  Intact without suspicious lesions or rashes ; no tenting or jaundice  Lymphatic: No lymphadenopathy is noted about the head, neck, axilla Neuro: Strength, tone & DTRs normal.      Assessment & Plan:  See Current Assessment & Plan in Problem List under specific Diagnosis

## 2015-02-12 NOTE — Assessment & Plan Note (Signed)
Blood pressure goals reviewed. BMET 

## 2015-02-12 NOTE — Progress Notes (Signed)
Pre visit review using our clinic review tool, if applicable. No additional management support is needed unless otherwise documented below in the visit note. 

## 2015-04-26 ENCOUNTER — Other Ambulatory Visit: Payer: Self-pay | Admitting: Internal Medicine

## 2015-04-26 LAB — HM DIABETES EYE EXAM

## 2015-04-27 ENCOUNTER — Other Ambulatory Visit: Payer: Self-pay | Admitting: Emergency Medicine

## 2015-04-27 MED ORDER — SITAGLIPTIN PHOS-METFORMIN HCL 50-500 MG PO TABS: 1.0000 | ORAL_TABLET | Freq: Every day | ORAL | Status: DC

## 2015-04-30 ENCOUNTER — Other Ambulatory Visit (INDEPENDENT_AMBULATORY_CARE_PROVIDER_SITE_OTHER): Payer: Medicare Other

## 2015-04-30 DIAGNOSIS — R7989 Other specified abnormal findings of blood chemistry: Secondary | ICD-10-CM

## 2015-04-30 DIAGNOSIS — R946 Abnormal results of thyroid function studies: Secondary | ICD-10-CM

## 2015-04-30 LAB — TSH: TSH: 4.6 u[IU]/mL — ABNORMAL HIGH (ref 0.35–4.50)

## 2015-04-30 LAB — T4, FREE: FREE T4: 0.88 ng/dL (ref 0.60–1.60)

## 2015-05-03 ENCOUNTER — Ambulatory Visit (INDEPENDENT_AMBULATORY_CARE_PROVIDER_SITE_OTHER): Payer: Medicare Other | Admitting: Internal Medicine

## 2015-05-03 ENCOUNTER — Encounter: Payer: Self-pay | Admitting: Internal Medicine

## 2015-05-03 VITALS — BP 140/68 | HR 55 | Temp 97.6°F | Resp 16 | Wt 180.0 lb

## 2015-05-03 DIAGNOSIS — Z23 Encounter for immunization: Secondary | ICD-10-CM

## 2015-05-03 DIAGNOSIS — R946 Abnormal results of thyroid function studies: Secondary | ICD-10-CM | POA: Diagnosis not present

## 2015-05-03 DIAGNOSIS — R7989 Other specified abnormal findings of blood chemistry: Secondary | ICD-10-CM | POA: Insufficient documentation

## 2015-05-03 NOTE — Progress Notes (Signed)
Pre visit review using our clinic review tool, if applicable. No additional management support is needed unless otherwise documented below in the visit note. 

## 2015-05-03 NOTE — Patient Instructions (Signed)
   Thyroid replacement is not indicated; please recheck TSH in 6 months.

## 2015-05-03 NOTE — Progress Notes (Signed)
   Subjective:    Patient ID: Brett Franklin, male    DOB: 1926-04-13, 81 y.o.   MRN: [id number]  HPI His TSH had been elevated at 5.41; follow-up revealed a normal free T4 and a TSH of 4.60. Although he has a history of irregular heartbeat he has no symptoms to suggest thyroid dysfunction.  He has recently had a nice exam which revealed no pathology.    Review of Systems   Pertinent negative or absent signs and symptoms are as follows: Constitutional: No significant change in weight; significant fatigue; sleep disorder; change in appetite. Eye: no blurred, double ,loss of vision Cardiovascular: no palpitations; racing; irregularity ENT/GI: no constipation; diarrhea;hoarseness;dysphagia Derm: no change in nails,hair,skin Neuro: no numbness or tingling; tremor Psych:no anxiety; depression; panic attacks Endo: no temperature intolerance to heat ,cold     Objective:   Physical Exam  Gen.:  Adequately nourished; in no acute distress Eyes: Extraocular motion intact; no lid lag , proptosis , or nystagmus Neck: full ROM; no masses ; thyroid not palpable Heart: Normal rhythm and rate without significant murmur, gallop, or extra heart sounds Lungs: Chest clear to auscultation without rales,rales, wheezes Neuro:Deep tendon reflexes are equal and 0-1/2+;no tremor  Skin/Nails: Warm and dry without significant lesions or rashes; no onycholysis Lymphatic: no cervical or axillary LA Psych: Normally communicative and interactive; no abnormal mood or affect clinically.         Assessment & Plan:  #1 elevated TSH which is now only minimally elevated above normal  Plan: Supplementation not indicated. Recheck TSH in 6 months

## 2015-05-07 ENCOUNTER — Encounter: Payer: Self-pay | Admitting: Internal Medicine

## 2015-06-10 ENCOUNTER — Other Ambulatory Visit: Payer: Self-pay | Admitting: Gastroenterology

## 2015-07-04 ENCOUNTER — Other Ambulatory Visit: Payer: Self-pay | Admitting: Internal Medicine

## 2015-07-10 ENCOUNTER — Other Ambulatory Visit: Payer: Self-pay | Admitting: Emergency Medicine

## 2015-07-10 ENCOUNTER — Other Ambulatory Visit: Payer: Self-pay | Admitting: Internal Medicine

## 2015-07-10 MED ORDER — SIMVASTATIN 10 MG PO TABS: ORAL_TABLET | ORAL | Status: DC

## 2015-08-06 ENCOUNTER — Encounter: Payer: Medicare Other | Admitting: Internal Medicine

## 2015-08-10 ENCOUNTER — Ambulatory Visit (INDEPENDENT_AMBULATORY_CARE_PROVIDER_SITE_OTHER): Payer: Medicare Other | Admitting: Internal Medicine

## 2015-08-10 ENCOUNTER — Encounter: Payer: Self-pay | Admitting: Internal Medicine

## 2015-08-10 ENCOUNTER — Other Ambulatory Visit (INDEPENDENT_AMBULATORY_CARE_PROVIDER_SITE_OTHER): Payer: Medicare Other

## 2015-08-10 VITALS — BP 122/78 | HR 66 | Temp 97.1°F | Ht 69.0 in | Wt 177.0 lb

## 2015-08-10 DIAGNOSIS — Z23 Encounter for immunization: Secondary | ICD-10-CM

## 2015-08-10 DIAGNOSIS — I10 Essential (primary) hypertension: Secondary | ICD-10-CM | POA: Diagnosis not present

## 2015-08-10 DIAGNOSIS — E785 Hyperlipidemia, unspecified: Secondary | ICD-10-CM | POA: Diagnosis not present

## 2015-08-10 DIAGNOSIS — Z8673 Personal history of transient ischemic attack (TIA), and cerebral infarction without residual deficits: Secondary | ICD-10-CM | POA: Diagnosis not present

## 2015-08-10 DIAGNOSIS — E1149 Type 2 diabetes mellitus with other diabetic neurological complication: Secondary | ICD-10-CM

## 2015-08-10 LAB — HEPATIC FUNCTION PANEL
ALK PHOS: 48 U/L (ref 39–117)
ALT: 11 U/L (ref 0–53)
AST: 14 U/L (ref 0–37)
Albumin: 4.2 g/dL (ref 3.5–5.2)
BILIRUBIN DIRECT: 0.2 mg/dL (ref 0.0–0.3)
BILIRUBIN TOTAL: 0.7 mg/dL (ref 0.2–1.2)
Total Protein: 6.9 g/dL (ref 6.0–8.3)

## 2015-08-10 LAB — LIPID PANEL
CHOL/HDL RATIO: 2
Cholesterol: 90 mg/dL (ref 0–200)
HDL: 52.7 mg/dL (ref >39.00)
LDL CALC: 30 mg/dL (ref 0–99)
NONHDL: 37.42
TRIGLYCERIDES: 36 mg/dL (ref 0.0–149.0)
VLDL: 7.2 mg/dL (ref 0.0–40.0)

## 2015-08-10 LAB — BASIC METABOLIC PANEL
BUN: 22 mg/dL (ref 6–23)
CHLORIDE: 103 meq/L (ref 96–112)
CO2: 28 mEq/L (ref 19–32)
Calcium: 9.4 mg/dL (ref 8.4–10.5)
Creatinine, Ser: 1.07 mg/dL (ref 0.40–1.50)
GFR: 69.37 mL/min (ref >60.00)
Glucose, Bld: 163 mg/dL — ABNORMAL HIGH (ref 70–99)
POTASSIUM: 4.4 meq/L (ref 3.5–5.1)
SODIUM: 138 meq/L (ref 135–145)

## 2015-08-10 LAB — HEMOGLOBIN A1C: Hgb A1c MFr Bld: 6.8 % — ABNORMAL HIGH (ref 4.6–6.5)

## 2015-08-10 LAB — MICROALBUMIN / CREATININE URINE RATIO
CREATININE, U: 255.1 mg/dL
MICROALB UR: 1.3 mg/dL (ref 0.0–1.9)
Microalb Creat Ratio: 0.5 mg/g (ref 0.0–30.0)

## 2015-08-10 LAB — TSH: TSH: 2.8 u[IU]/mL (ref 0.35–4.50)

## 2015-08-10 LAB — CK: Total CK: 62 U/L (ref 7–232)

## 2015-08-10 MED ORDER — TRAMADOL HCL 50 MG PO TABS: 50.0000 mg | ORAL_TABLET | Freq: Three times a day (TID) | ORAL | Status: DC | PRN

## 2015-08-10 MED ORDER — LORAZEPAM 0.5 MG PO TABS: 0.5000 mg | ORAL_TABLET | Freq: Two times a day (BID) | ORAL | Status: DC | PRN

## 2015-08-10 NOTE — Progress Notes (Signed)
   Subjective:    Patient ID: KOAH HINCHEE, male    DOB: 1925/10/11, 81 y.o.   MRN: [id number]  HPI The patient is here to assess status of active health conditions.  PMH, FH, & Social History reviewed & updated.No change in [place] as recorded.  He has been compliant with his medications. He has no adverse effects. He will have red meat once a week and fried foods 2-3 times per week. He does restrict sodium. He is walking 45 minutes 7 days a week. He denies cardiopulmonary symptoms with this.  He has aged out of his colonoscopy surveillance. He has no active GI symptoms  Blood pressures range from 104-133/60 6-73. Fasting blood sugars range 122-153. He denies any hypoglycemia. Ophthalmologic exam is up to date.  Review of systems is negative except for some easy bruising. He has no other bleeding dyscrasias. He is on aspirin because of history of cerebrovascular accident.   Review of Systems  Chest pain, palpitations, tachycardia, exertional dyspnea, paroxysmal nocturnal dyspnea, claudication or edema are absent. No unexplained weight loss, abdominal pain, significant dyspepsia, dysphagia, melena, rectal bleeding, or persistently small caliber stools. Dysuria, pyuria, hematuria, frequency, nocturia or polyuria are denied. Change in hair, skin, nails denied. No bowel changes of constipation or diarrhea. No intolerance to heat or cold.     Objective:   Physical Exam Pertinent or positive findings include: He appears younger than stated age. He has bilateral hearing aids. Pupils are pinpoint. He has bilateral ptosis. There is decrease in accommodation with extraocular motion. He has decreased range of motion of cervical spine. Striae are noted over the abdomen. Pedal pulses are decreased, especially dorsalis pedis pulses. Reflexes are 1/2+ @ the knee.  General appearance :adequately nourished; in no distress.  Eyes: No conjunctival inflammation or scleral icterus is present.  Oral exam:   Lips and gums are healthy appearing.There is no oropharyngeal erythema or exudate noted. Dental hygiene is good.  Heart:  Normal rate and regular rhythm. S1 and S2 normal without gallop, murmur, click, rub or other extra sounds    Lungs:Chest clear to auscultation; no wheezes, rhonchi,rales ,or rubs present.No increased work of breathing.   Abdomen: bowel sounds normal, soft and non-tender without masses, organomegaly or hernias noted.  NoNo flank tenderness to percussion.  Vascular : all pulses equal ; no bruits present.  Skin:Warm & dry.  Intact without suspicious lesions or rashes ; no tenting or jaundice   Lymphatic: No lymphadenopathy is noted about the head, neck, axilla.   Neuro: Strength, tone  normal.      Assessment & Plan:  See Current Assessment & Plan in Problem List under specific Diagnosis

## 2015-08-10 NOTE — Progress Notes (Signed)
Pre visit review using our clinic review tool, if applicable. No additional management support is needed unless otherwise documented below in the visit note. 

## 2015-08-10 NOTE — Patient Instructions (Signed)
  Your next office appointment will be determined based upon review of your pending labs. Those written interpretation of the lab results and instructions will be transmitted to you by mail for your records.  Critical results will be called.   Followup as needed for any active or acute issue. Please report any significant change in your symptoms. 

## 2015-08-10 NOTE — Assessment & Plan Note (Signed)
Blood pressure goals reviewed. BMET 

## 2015-08-10 NOTE — Assessment & Plan Note (Signed)
A1c , urine microalbumin, BMET 

## 2015-08-10 NOTE — Assessment & Plan Note (Signed)
Lipids, LFTs, TSH ,CK 

## 2015-08-12 ENCOUNTER — Other Ambulatory Visit: Payer: Self-pay | Admitting: Internal Medicine

## 2015-09-25 ENCOUNTER — Other Ambulatory Visit: Payer: Self-pay | Admitting: Emergency Medicine

## 2015-09-25 MED ORDER — METOPROLOL TARTRATE 25 MG PO TABS: ORAL_TABLET | ORAL | Status: DC

## 2015-09-25 MED ORDER — AMLODIPINE BESYLATE 5 MG PO TABS: ORAL_TABLET | ORAL | Status: DC

## 2016-01-03 ENCOUNTER — Other Ambulatory Visit: Payer: Self-pay

## 2016-01-03 MED ORDER — SITAGLIPTIN PHOS-METFORMIN HCL 50-500 MG PO TABS: 1.0000 | ORAL_TABLET | Freq: Every day | ORAL | Status: DC

## 2016-02-04 ENCOUNTER — Other Ambulatory Visit (INDEPENDENT_AMBULATORY_CARE_PROVIDER_SITE_OTHER): Payer: Medicare Other

## 2016-02-04 ENCOUNTER — Encounter: Payer: Self-pay | Admitting: Internal Medicine

## 2016-02-04 ENCOUNTER — Ambulatory Visit (INDEPENDENT_AMBULATORY_CARE_PROVIDER_SITE_OTHER): Payer: Medicare Other | Admitting: Internal Medicine

## 2016-02-04 VITALS — BP 144/84 | HR 105 | Temp 98.1°F | Resp 16 | Ht 69.0 in | Wt 178.0 lb

## 2016-02-04 DIAGNOSIS — E785 Hyperlipidemia, unspecified: Secondary | ICD-10-CM

## 2016-02-04 DIAGNOSIS — E1149 Type 2 diabetes mellitus with other diabetic neurological complication: Secondary | ICD-10-CM

## 2016-02-04 DIAGNOSIS — Z8673 Personal history of transient ischemic attack (TIA), and cerebral infarction without residual deficits: Secondary | ICD-10-CM

## 2016-02-04 DIAGNOSIS — I10 Essential (primary) hypertension: Secondary | ICD-10-CM

## 2016-02-04 DIAGNOSIS — F419 Anxiety disorder, unspecified: Secondary | ICD-10-CM

## 2016-02-04 LAB — COMPREHENSIVE METABOLIC PANEL
ALT: 11 U/L (ref 0–53)
AST: 16 U/L (ref 0–37)
Albumin: 4.6 g/dL (ref 3.5–5.2)
Alkaline Phosphatase: 40 U/L (ref 39–117)
BUN: 24 mg/dL — ABNORMAL HIGH (ref 6–23)
CALCIUM: 9.7 mg/dL (ref 8.4–10.5)
CHLORIDE: 101 meq/L (ref 96–112)
CO2: 26 meq/L (ref 19–32)
Creatinine, Ser: 1.07 mg/dL (ref 0.40–1.50)
GFR: 69.29 mL/min (ref >60.00)
Glucose, Bld: 150 mg/dL — ABNORMAL HIGH (ref 70–99)
Potassium: 4.5 mEq/L (ref 3.5–5.1)
Sodium: 135 mEq/L (ref 135–145)
Total Bilirubin: 0.8 mg/dL (ref 0.2–1.2)
Total Protein: 7.3 g/dL (ref 6.0–8.3)

## 2016-02-04 LAB — LIPID PANEL
CHOLESTEROL: 105 mg/dL (ref 0–200)
HDL: 64.1 mg/dL (ref >39.00)
LDL CALC: 31 mg/dL (ref 0–99)
NonHDL: 41.13
TRIGLYCERIDES: 50 mg/dL (ref 0.0–149.0)
Total CHOL/HDL Ratio: 2
VLDL: 10 mg/dL (ref 0.0–40.0)

## 2016-02-04 LAB — TSH: TSH: 3.88 u[IU]/mL (ref 0.35–4.50)

## 2016-02-04 LAB — HEMOGLOBIN A1C: Hgb A1c MFr Bld: 6.8 % — ABNORMAL HIGH (ref 4.6–6.5)

## 2016-02-04 NOTE — Assessment & Plan Note (Signed)
Blood pressure slightly elevated here today, but mostly it has been well controlled He will continue to monitor at home Continue current medication-he does have instructions to increase the metoprolol if needed cmp

## 2016-02-04 NOTE — Assessment & Plan Note (Signed)
Check lipid panel Continue simvastatin-we'll titrate if needed

## 2016-02-04 NOTE — Assessment & Plan Note (Signed)
Check A1c  Continue current dose of Janumet-we'll increase if needed

## 2016-02-04 NOTE — Assessment & Plan Note (Signed)
Takes lorazepam only as needed, which is rare Will continue-refill not needed today

## 2016-02-04 NOTE — Patient Instructions (Addendum)

## 2016-02-04 NOTE — Progress Notes (Signed)
Pre visit review using our clinic review tool, if applicable. No additional management support is needed unless otherwise documented below in the visit note. 

## 2016-02-04 NOTE — Progress Notes (Signed)
Subjective:    Patient ID: Brett Franklin, male    DOB: 06-01-1926, 82 y.o.   MRN: [id number]  HPI He is here to establish with a new pcp.  Back pain:  He has taken tramadol as needed.   He has not had pain in a while and does not need the tramadol.    Diabetes: He is taking his medication daily as prescribed. He is compliant with a diabetic diet. He is exercising regularly - walks 1.5 miles every day. He monitors his sugars and they have been running 125-150.  He is up-to-date with an ophthalmology examination.    Hypertension: He is taking his medication daily. He is compliant with a low sodium diet.  He denies chest pain, palpitations, edema, shortness of breath and regular headaches. He is exercising regularly.  He does monitor his blood pressure at home on occasion and he feels it is better controlled than what was today.    Hyperlipidemia: He is taking his medication daily. He is compliant with a low fat/cholesterol diet. He is exercising regularly. He denies myalgias.   Anxiety: A few years ago he had a pain clinic attack and ended up going to the emergency room. He now uses lorazepam only as needed if he becomes anxious. Medication works well and he does not take it often.  History of stroke: He was told that he had a stroke, but denies any residual symptoms. He has been taking a full aspirin daily since that time and denies any side effects except for easy bruising.    Medications and allergies reviewed with patient and updated if appropriate.  Patient Active Problem List   Diagnosis Date Noted  . Elevated TSH 05/03/2015  . Hyperlipidemia 07/21/2011  . GERD 09/09/2010  . OTHER DYSPHAGIA 03/04/2010  . IRREGULAR HEART RATE 01/14/2010  . History of stroke 12/31/2009  . LOW BACK PAIN SYNDROME 11/12/2009  . CERVICAL RADICULOPATHY, LEFT 05/11/2008  . NEPHROLITHIASIS, HX OF 05/11/2008  . Diabetes mellitus with neurological manifestations, controlled ([place]) 11/16/2007  .  Anxiety state, unspecified 07/08/2007  . MELANOMA 01/21/2007  . Essential hypertension 01/21/2007  . IBS 01/21/2007  . BENIGN PROSTATIC HYPERTROPHY 01/21/2007  . STENOSIS, CERVICAL SPINAL 01/21/2007  . History of adenomatous polyp of colon 01/21/2007    Current Outpatient Prescriptions on File Prior to Visit  Medication Sig Dispense Refill  . amLODipine (NORVASC) 5 MG tablet TAKE 1 TABLET BY MOUTH EVERY DAY 90 tablet 1  . aspirin 325 MG tablet Take 325 mg by mouth daily.      Marland Kitchen LORazepam (ATIVAN) 0.5 MG tablet Take 1 tablet (0.5 mg total) by mouth every 12 (twelve) hours as needed for anxiety. 30 tablet 1  . metoprolol tartrate (LOPRESSOR) 25 MG tablet TAKE 1/2 TABLET BY EVERY 12 HOURS, THEN INCREASE TO 1 TABLET TWICE DAILY IF BP ABOVE 140/90 90 tablet 1  . Multiple Vitamins-Minerals (MACULAR VITAMIN BENEFIT PO) Take by mouth 2 (two) times daily.    . simvastatin (ZOCOR) 10 MG tablet TAKE 1/2 TABLET BY MOUTH AT BEDTIME 72 tablet 2  . sitaGLIPtin-metformin (JANUMET) 50-500 MG tablet Take 1 tablet by mouth daily. 90 tablet 0   No current facility-administered medications on file prior to visit.    Past Medical History  Diagnosis Date  . Diabetes mellitus, type 2 ([place])   . Hypertension   . BPH (benign prostatic hyperplasia)   . Melanoma ([place])   . Anxiety   . IBS (irritable bowel syndrome)   .  Cervical spinal stenosis   . Nephrolithiasis      X 4  . CVA (cerebral vascular accident) ([place])   . Hemorrhoids   . GERD (gastroesophageal reflux disease)   . Adenomatous polyp of colon 11/1996    Dr Fuller Plan    Past Surgical History  Procedure Laterality Date  . Cholecystectomy    . Cystoscopy      Dr.Kimbrough   . Colonoscopy w/ polypectomy      Dr.Stark  . Cataract extraction Left     Social History   Social History  . Marital Status: Married    Spouse Name: N/A  . Number of Children: 2  . Years of Education: N/A   Occupational History  . retired    Social History Main  Topics  . Smoking status: Never Smoker   . Smokeless tobacco: Never Used  . Alcohol Use: No  . Drug Use: No  . Sexual Activity: Not Asked   Other Topics Concern  . None   Social History Narrative    Family History  Problem Relation Age of Onset  . Alcohol abuse Father   . Heart disease Father   . Heart attack Maternal Grandfather 65  . Cancer Neg Hx   . Stroke Neg Hx   . Diabetes type I      grandson    Review of Systems  Constitutional: Negative for fever and fatigue.  Respiratory: Negative for cough, shortness of breath and wheezing.   Cardiovascular: Negative for chest pain, palpitations and leg swelling.  Gastrointestinal: Negative for abdominal pain.       No gerd  Neurological: Negative for dizziness, light-headedness and headaches.       Objective:   Filed Vitals:   02/04/16 1050  BP: 144/84  Pulse: 105  Temp: 98.1 F (36.7 C)  Resp: 16   Filed Weights   02/04/16 1050  Weight: 178 lb (80.74 kg)   Body mass index is 26.27 kg/(m^2).   Physical Exam Constitutional: Appears well-developed and well-nourished. No distress.  Neck: Neck supple. No tracheal deviation present. No thyromegaly present.  No carotid bruit. No cervical adenopathy.   Cardiovascular: Normal rate, regular rhythm and normal heart sounds.   No murmur heard.  No edema Pulmonary/Chest: Effort normal and breath sounds normal. No respiratory distress. No wheezes.  Psych: normal mood and affect       Assessment & Plan:   See Problem List for Assessment and Plan of chronic medical problems.  Follow up in 6 months

## 2016-02-04 NOTE — Assessment & Plan Note (Signed)
Continue full dose aspirin

## 2016-03-28 ENCOUNTER — Other Ambulatory Visit: Payer: Self-pay | Admitting: Internal Medicine

## 2016-03-31 ENCOUNTER — Encounter: Payer: Self-pay | Admitting: Podiatry

## 2016-03-31 ENCOUNTER — Ambulatory Visit (INDEPENDENT_AMBULATORY_CARE_PROVIDER_SITE_OTHER): Payer: Medicare Other | Admitting: Podiatry

## 2016-03-31 VITALS — BP 140/77 | HR 52 | Resp 12

## 2016-03-31 DIAGNOSIS — M216X9 Other acquired deformities of unspecified foot: Secondary | ICD-10-CM | POA: Diagnosis not present

## 2016-03-31 DIAGNOSIS — Q828 Other specified congenital malformations of skin: Secondary | ICD-10-CM

## 2016-03-31 NOTE — Progress Notes (Signed)
   Subjective:    Patient ID: Brett Franklin, male    DOB: Jul 18, 1926, 82 y.o.   MRN: [id number]  HPI Chief Complaint  Patient presents with  . Callouses    LT FOOT HAVE HARD SKIN/PAINFUL FOR ABOUT 2 MONTHS. FOOT IS WORSE WHEN WALKING BAREFOOT. TRIED NO TREATMENT.      Review of Systems  Musculoskeletal: Positive for gait problem.  Skin: Positive for color change.       Objective:   Physical Exam        Assessment & Plan:

## 2016-03-31 NOTE — Progress Notes (Signed)
Subjective:     Patient ID: Brett Franklin, male   DOB: 09/08/1926, 82 y.o.   MRN: [id number]  HPI patient presents with a lesion underneath the left foot that's been really bothering him and making walking difficult. He's tried different treatment options including anti-inflammatories and other modalities without relief of symptoms and has tried to wear more cushioned shoes   Review of Systems  All other systems reviewed and are negative.      Objective:   Physical Exam  Constitutional: He is oriented to person, place, and time.  Cardiovascular: Intact distal pulses.   Musculoskeletal: Normal range of motion.  Neurological: He is oriented to person, place, and time.  Skin: Skin is warm and dry.  Nursing note and vitals reviewed.  neurovascular status was found to be intact muscle strength was adequate range of motion was found to be within normal limits with patient found to have keratotic lesion sub-fifth metatarsal left with thickness of the tissue and pain when palpated. Patient's found to have good digital perfusion is well oriented 3     Assessment:     Inflammatory changes consistent with plantarflexed metatarsal along with probable porokeratotic type lesion    Plan:     H&P x-rays reviewed condition discussed. At this point debridement accomplished and I reviewed with him possible surgical intervention which may be necessary one point in the future depending on how quickly returns

## 2016-04-13 ENCOUNTER — Other Ambulatory Visit: Payer: Self-pay | Admitting: Internal Medicine

## 2016-04-28 LAB — HM DIABETES EYE EXAM

## 2016-05-08 ENCOUNTER — Other Ambulatory Visit: Payer: Self-pay | Admitting: Internal Medicine

## 2016-05-15 ENCOUNTER — Encounter: Payer: Self-pay | Admitting: Internal Medicine

## 2016-07-18 ENCOUNTER — Other Ambulatory Visit: Payer: Self-pay | Admitting: Internal Medicine

## 2016-07-19 ENCOUNTER — Other Ambulatory Visit: Payer: Self-pay | Admitting: Internal Medicine

## 2016-07-22 ENCOUNTER — Ambulatory Visit (INDEPENDENT_AMBULATORY_CARE_PROVIDER_SITE_OTHER): Payer: Medicare Other

## 2016-07-22 DIAGNOSIS — Z23 Encounter for immunization: Secondary | ICD-10-CM

## 2016-07-25 ENCOUNTER — Telehealth: Payer: Self-pay | Admitting: Emergency Medicine

## 2016-07-25 NOTE — Telephone Encounter (Signed)
Spoke with pt's wife, States the pt is taking the medication as prescribed. 1 tab my mouth daily except 1/2 tab on tues and Sat. Please advise on changes that need to be made bc insurance is no longer going to cover this sig.

## 2016-07-27 NOTE — Telephone Encounter (Signed)
His cholesterol is very well controlled. Lets have him try one tablet four times a week.    Hopefully his insurance will not have an issue with that.  Send new prescription if he is ok with this.

## 2016-07-28 MED ORDER — SIMVASTATIN 10 MG PO TABS: ORAL_TABLET | ORAL | 1 refills | Status: DC

## 2016-07-28 NOTE — Telephone Encounter (Signed)
Spoke with pts wife to inform. RX sent to [facility]

## 2016-08-07 ENCOUNTER — Ambulatory Visit (INDEPENDENT_AMBULATORY_CARE_PROVIDER_SITE_OTHER): Payer: Medicare Other | Admitting: Podiatry

## 2016-08-07 ENCOUNTER — Ambulatory Visit (INDEPENDENT_AMBULATORY_CARE_PROVIDER_SITE_OTHER): Payer: Medicare Other

## 2016-08-07 ENCOUNTER — Encounter: Payer: Self-pay | Admitting: Podiatry

## 2016-08-07 VITALS — BP 153/78 | HR 56 | Resp 16

## 2016-08-07 DIAGNOSIS — M722 Plantar fascial fibromatosis: Secondary | ICD-10-CM | POA: Diagnosis not present

## 2016-08-07 DIAGNOSIS — M216X9 Other acquired deformities of unspecified foot: Secondary | ICD-10-CM | POA: Diagnosis not present

## 2016-08-07 DIAGNOSIS — M79671 Pain in right foot: Secondary | ICD-10-CM

## 2016-08-07 MED ORDER — TRIAMCINOLONE ACETONIDE 10 MG/ML IJ SUSP
10.0000 mg | Freq: Once | INTRAMUSCULAR | Status: AC
  Administered 2016-08-07: 10 mg

## 2016-08-07 NOTE — Patient Instructions (Signed)

## 2016-08-07 NOTE — Progress Notes (Signed)
Subjective:     Patient ID: Brett Franklin, male   DOB: [birth year], 82 y.o.   MRN: [id number]  HPI patient states I developed a lot of pain in the bottom of my right heel and slightly in the back and my left foot that was worked on for months ago continues to do well with slight reoccurrence   Review of Systems     Objective:   Physical Exam Neurovascular status intact with discomfort plantar aspect right heel at the insertional point tendon into the calcaneus with inflammation and fluid buildup noted. Patient's noted to have slight keratotic lesion of the fifth metatarsal left localized in nature    Assessment:     Acute plantar fasciitis left with cavus plantar flexed fifth metatarsal left    Plan:     H&P conditions reviewed and carefully reviewed x-rays. I injected the plantar fascia right 3 mg Kenalog 5 mg Xylocaine and applied fascial brace with instructions and also advised on not going without shoe gear at home. For the left I do not recommend any treatment currently and I did discuss with him the possibilities for problems in the future  X-rays indicate that the heel shows small spur formation with no indications of stress fracture arthritis currently

## 2016-09-03 ENCOUNTER — Ambulatory Visit (INDEPENDENT_AMBULATORY_CARE_PROVIDER_SITE_OTHER): Payer: Medicare Other | Admitting: Internal Medicine

## 2016-09-03 ENCOUNTER — Encounter: Payer: Self-pay | Admitting: Internal Medicine

## 2016-09-03 ENCOUNTER — Other Ambulatory Visit (INDEPENDENT_AMBULATORY_CARE_PROVIDER_SITE_OTHER): Payer: Medicare Other

## 2016-09-03 VITALS — BP 126/86 | HR 54 | Temp 98.0°F | Resp 16 | Ht 69.0 in | Wt 178.0 lb

## 2016-09-03 DIAGNOSIS — Z0000 Encounter for general adult medical examination without abnormal findings: Secondary | ICD-10-CM | POA: Diagnosis not present

## 2016-09-03 DIAGNOSIS — C439 Malignant melanoma of skin, unspecified: Secondary | ICD-10-CM

## 2016-09-03 DIAGNOSIS — I10 Essential (primary) hypertension: Secondary | ICD-10-CM

## 2016-09-03 DIAGNOSIS — E1149 Type 2 diabetes mellitus with other diabetic neurological complication: Secondary | ICD-10-CM

## 2016-09-03 DIAGNOSIS — R7989 Other specified abnormal findings of blood chemistry: Secondary | ICD-10-CM

## 2016-09-03 DIAGNOSIS — N400 Enlarged prostate without lower urinary tract symptoms: Secondary | ICD-10-CM

## 2016-09-03 DIAGNOSIS — E7800 Pure hypercholesterolemia, unspecified: Secondary | ICD-10-CM

## 2016-09-03 DIAGNOSIS — F419 Anxiety disorder, unspecified: Secondary | ICD-10-CM | POA: Diagnosis not present

## 2016-09-03 DIAGNOSIS — R946 Abnormal results of thyroid function studies: Secondary | ICD-10-CM

## 2016-09-03 LAB — MICROALBUMIN / CREATININE URINE RATIO
CREATININE, U: 167.5 mg/dL
MICROALB UR: 0.9 mg/dL (ref 0.0–1.9)
MICROALB/CREAT RATIO: 0.5 mg/g (ref 0.0–30.0)

## 2016-09-03 LAB — CBC WITH DIFFERENTIAL/PLATELET
BASOS PCT: 0.3 % (ref 0.0–3.0)
Basophils Absolute: 0 10*3/uL (ref 0.0–0.1)
EOS PCT: 0.6 % (ref 0.0–5.0)
Eosinophils Absolute: 0.1 10*3/uL (ref 0.0–0.7)
HEMATOCRIT: 45.4 % (ref 39.0–52.0)
HEMOGLOBIN: 15.3 g/dL (ref 13.0–17.0)
LYMPHS PCT: 12.9 % (ref 12.0–46.0)
Lymphs Abs: 1.3 10*3/uL (ref 0.7–4.0)
MCHC: 33.6 g/dL (ref 30.0–36.0)
MCV: 96.8 fl (ref 78.0–100.0)
MONO ABS: 0.9 10*3/uL (ref 0.1–1.0)
Monocytes Relative: 8.7 % (ref 3.0–12.0)
Neutro Abs: 7.6 10*3/uL (ref 1.4–7.7)
Neutrophils Relative %: 77.5 % — ABNORMAL HIGH (ref 43.0–77.0)
Platelets: 183 10*3/uL (ref 150.0–400.0)
RBC: 4.69 Mil/uL (ref 4.22–5.81)
RDW: 14.8 % (ref 11.5–15.5)
WBC: 9.8 10*3/uL (ref 4.0–10.5)

## 2016-09-03 LAB — COMPREHENSIVE METABOLIC PANEL
ALBUMIN: 4.2 g/dL (ref 3.5–5.2)
ALK PHOS: 46 U/L (ref 39–117)
ALT: 10 U/L (ref 0–53)
AST: 14 U/L (ref 0–37)
BUN: 24 mg/dL — AB (ref 6–23)
CALCIUM: 9.4 mg/dL (ref 8.4–10.5)
CO2: 28 mEq/L (ref 19–32)
CREATININE: 1.09 mg/dL (ref 0.40–1.50)
Chloride: 103 mEq/L (ref 96–112)
GFR: 67.74 mL/min (ref >60.00)
Glucose, Bld: 162 mg/dL — ABNORMAL HIGH (ref 70–99)
POTASSIUM: 4.4 meq/L (ref 3.5–5.1)
SODIUM: 139 meq/L (ref 135–145)
TOTAL PROTEIN: 7 g/dL (ref 6.0–8.3)
Total Bilirubin: 0.6 mg/dL (ref 0.2–1.2)

## 2016-09-03 LAB — TSH: TSH: 3.92 u[IU]/mL (ref 0.35–4.50)

## 2016-09-03 LAB — LIPID PANEL
CHOLESTEROL: 105 mg/dL (ref 0–200)
HDL: 57 mg/dL (ref >39.00)
LDL Cholesterol: 40 mg/dL (ref 0–99)
NONHDL: 47.6
Total CHOL/HDL Ratio: 2
Triglycerides: 40 mg/dL (ref 0.0–149.0)
VLDL: 8 mg/dL (ref 0.0–40.0)

## 2016-09-03 LAB — HEMOGLOBIN A1C: HEMOGLOBIN A1C: 7 % — AB (ref 4.6–6.5)

## 2016-09-03 MED ORDER — LORAZEPAM 0.5 MG PO TABS: 0.5000 mg | ORAL_TABLET | Freq: Two times a day (BID) | ORAL | 0 refills | Status: DC | PRN

## 2016-09-03 NOTE — Progress Notes (Addendum)
Subjective:    Patient ID: Brett Franklin, male    DOB: May 04, 1926, 82 y.o.   MRN: [id number]  HPI Here for medicare wellness exam and annual physical exam.    I have personally reviewed and have noted 1.The patient's medical and social history 2.Their use of alcohol, tobacco or illicit drugs 3.Their current medications and supplements 4.The patient's functional ability including ADL's, fall risks, home safety risks and hearing or visual impairment. 5.Diet and physical activities 6.Evidence for depression or mood disorders 7.Care team reviewed - Dr Paulla Dolly - podiatry, Eye - Dr Herbert Deaner, Urology- Dr Diona Fanti, Derm - Dr Ubaldo Glassing, GI - Dr Fuller Plan    Are there smokers in your home (other than you)? No  Risk Factors Exercise: walks daily - about one mile Dietary issues discussed: well balanced, avoids sweets  Cardiac risk factors: advanced age, hypertension, hyperlipidemia  Depression Screen  Have you felt down, depressed or hopeless? No  Have you felt little interest or pleasure in doing things?  No  Activities of Daily Living In your present state of health, do you have any difficulty performing the following activities?:  Driving? No Managing money?  No Feeding yourself? No Getting from bed to chair? No Climbing a flight of stairs? No Preparing food and eating?: No Bathing or showering? No Getting dressed: No Getting to/using the toilet? No Moving around from place to place: No In the past year have you fallen or had a near fall?: No   Are you sexually active?  Sort of  Do you have more than one partner?  N/A  Hearing Difficulties: yes, wears hearing aids Do you often ask people to speak up or repeat themselves? yes Do you experience ringing or noises in your ears? No Do you have difficulty understanding soft or whispered voices? yes Vision:              Any change in vision: no             Up to date with  eye exam: yes, has appt tomorrow  Memory:  Do you feel that you have a problem with memory? No  Do you often misplace items? No  Do you feel safe at home?  Yes  Cognitive Testing  Alert, Orientated? Yes  Normal Appearance? Yes  Recall of three objects?  Yes  Can perform simple calculations? Yes  Displays appropriate judgment? Yes  Can read the correct time from a watch face? Yes   Advanced Directives have been discussed with the patient? Yes - in place   Medications and allergies reviewed with patient and updated if appropriate.  Patient Active Problem List   Diagnosis Date Noted  . Elevated TSH 05/03/2015  . Hyperlipidemia 07/21/2011  . GERD 09/09/2010  . OTHER DYSPHAGIA 03/04/2010  . History of stroke 12/31/2009  . LOW BACK PAIN SYNDROME 11/12/2009  . CERVICAL RADICULOPATHY, LEFT 05/11/2008  . NEPHROLITHIASIS, HX OF 05/11/2008  . Diabetes mellitus with neurological manifestations, controlled ([place]) 11/16/2007  . Anxiety 07/08/2007  . Melanoma of skin ([place]) 01/21/2007  . Essential hypertension 01/21/2007  . IBS 01/21/2007  . BENIGN PROSTATIC HYPERTROPHY 01/21/2007  . STENOSIS, CERVICAL SPINAL 01/21/2007  . History of adenomatous polyp of colon 01/21/2007    Current Outpatient Prescriptions on File Prior to Visit  Medication Sig Dispense Refill  . amLODipine (NORVASC) 5 MG tablet TAKE 1 TABLET BY MOUTH EVERY DAY 90 tablet 2  . aspirin 325 MG tablet Take 325 mg by mouth daily.      Marland Kitchen  JANUMET 50-500 MG tablet TAKE 1 TABLET BY MOUTH DAILY 90 tablet 1  . LORazepam (ATIVAN) 0.5 MG tablet Take 1 tablet (0.5 mg total) by mouth every 12 (twelve) hours as needed for anxiety. 30 tablet 1  . metoprolol tartrate (LOPRESSOR) 25 MG tablet TAKE ONE-HALF TABLET BY MOUTH EVERY 12 HOURS, THEN INCREASE TO 1 TABLET BY MOUTH TWICE DAILY IF BLOOD PRESSURE IS ABOVE 140/90 324 tablet 1  . Multiple Vitamins-Minerals (MACULAR VITAMIN BENEFIT PO) Take by mouth 2 (two) times daily.    . simvastatin  (ZOCOR) 10 MG tablet Take 1 tablet by mouth 4 times a week. 48 tablet 1   No current facility-administered medications on file prior to visit.     Past Medical History:  Diagnosis Date  . Adenomatous polyp of colon 11/1996   Dr Fuller Plan  . Anxiety   . BPH (benign prostatic hyperplasia)   . Cervical spinal stenosis   . CVA (cerebral vascular accident) ([place])   . Diabetes mellitus, type 2 ([place])   . GERD (gastroesophageal reflux disease)   . Hemorrhoids   . Hypertension   . IBS (irritable bowel syndrome)   . Melanoma ([place])   . Nephrolithiasis     X 4    Past Surgical History:  Procedure Laterality Date  . CATARACT EXTRACTION Left   . CHOLECYSTECTOMY    . COLONOSCOPY W/ POLYPECTOMY     Dr.Stark  . CYSTOSCOPY     Dr.Kimbrough     Social History   Social History  . Marital status: Married    Spouse name: N/A  . Number of children: 2  . Years of education: N/A   Occupational History  . retired Retired   Social History Main Topics  . Smoking status: Never Smoker  . Smokeless tobacco: Never Used  . Alcohol use No  . Drug use: No  . Sexual activity: Not on file   Other Topics Concern  . Not on file   Social History Narrative  . No narrative on file    Family History  Problem Relation Age of Onset  . Alcohol abuse Father   . Heart disease Father   . Heart attack Maternal Grandfather 65  . Diabetes type I      grandson  . Cancer Neg Hx   . Stroke Neg Hx     Review of Systems  Constitutional: Negative for appetite change, chills, fatigue, fever and unexpected weight change.  HENT: Positive for hearing loss. Negative for tinnitus.   Eyes: Negative for visual disturbance.  Respiratory: Negative for cough, shortness of breath and wheezing.   Cardiovascular: Negative for chest pain, palpitations and leg swelling.  Gastrointestinal: Negative for abdominal pain, blood in stool, diarrhea and nausea.       Gerd - none  Genitourinary: Negative for difficulty  urinating, dysuria and hematuria.  Musculoskeletal: Positive for back pain (occ left lower back pain - better with sitting down). Negative for arthralgias.  Skin: Negative for rash.  Neurological: Negative for dizziness, light-headedness and headaches.  Psychiatric/Behavioral: Negative for dysphoric mood. The patient is not nervous/anxious.        Objective:   Vitals:   09/03/16 0824  BP: 126/86  Pulse: (!) 54  Resp: 16  Temp: 98 F (36.7 C)   Filed Weights   09/03/16 0824  Weight: 178 lb (80.7 kg)   Body mass index is 26.29 kg/m.   Physical Exam Constitutional: He appears well-developed and well-nourished. No distress.  HENT:  Head: Normocephalic  and atraumatic.  Right Ear: External ear normal.  Left Ear: External ear normal.  Mouth/Throat: Oropharynx is clear and moist.  Normal ear canals and TM b/l  Eyes: Conjunctivae and EOM are normal.  Neck: Neck supple. No tracheal deviation present. No thyromegaly present.  No carotid bruit  Cardiovascular: Normal rate, regular rhythm, normal heart sounds and intact distal pulses.   No murmur heard. Pulmonary/Chest: Effort normal and breath sounds normal. No respiratory distress. He has no wheezes. He has no rales.  Abdominal: Soft. Bowel sounds are normal. He exhibits no distension. There is no tenderness.  Genitourinary: deferred - sees urology Musculoskeletal: He exhibits no edema.  Lymphadenopathy:   He has no cervical adenopathy.  Skin: Skin is warm and dry. He is not diaphoretic.  Psychiatric: He has a normal mood and affect. His behavior is normal.         Assessment & Plan:   Wellness Exam: Immunizations Up to date  Colonoscopy - no longer needed at his age Eye exam  Up to date  Hearing loss  Yes - wears hearing aids Memory concerns/difficulties  none Independent of ADLs  fully Stressed the importance of regular exercise   Patient received copy of preventative screening tests/immunizations recommended for  the next 5-10 years.  Physical exam: Screening blood work  ordered Immunizations Up to date  Colonoscopy - no longer needed at his age Eye exams  Up to date  EKG - last in chart 2011 -  Will repeat today given htn, DM Exercise -regular Weight  Good BMI Skin  - no concerns, sees derm regularly Substance abuse  none  See Problem List for Assessment and Plan of chronic medical problems.  F/u in 6 months   EKG reviewed and normal.

## 2016-09-03 NOTE — Assessment & Plan Note (Signed)
Check lipid panel  Continue daily statin Regular exercise and healthy diet encouraged  

## 2016-09-03 NOTE — Assessment & Plan Note (Signed)
tsh

## 2016-09-03 NOTE — Assessment & Plan Note (Signed)
Following with urology

## 2016-09-03 NOTE — Assessment & Plan Note (Signed)
Check a1c, urine microalbumin continue janumet

## 2016-09-03 NOTE — Patient Instructions (Addendum)
Mr. Brett Franklin , Thank you for taking time to come for your Medicare Wellness Visit. I appreciate your ongoing commitment to your health goals. Please review the following plan we discussed and let me know if I can assist you in the future.   These are the goals we discussed: Goals    None      This is a list of the screening recommended for you and due dates:  Health Maintenance  Topic Date Due  . Complete foot exam   09/03/2016  . Hemoglobin A1C  08/06/2016  . Urine Protein Check  08/09/2016  . Eye exam for diabetics  04/28/2017  . Tetanus Vaccine  05/02/2025  . Flu Shot  Completed  . Shingles Vaccine  Completed  . Pneumonia vaccines  Completed     Test(s) ordered today. Your results will be released to [facility] (or called to you) after review, usually within 72hours after test completion. If any changes need to be made, you will be notified at that same time.  All other Health Maintenance issues reviewed.   All recommended immunizations and age-appropriate screenings are up-to-date or discussed.  No immunizations administered today.   Medications reviewed and updated.  No changes recommended at this time.  Your prescription(s) have been submitted to your pharmacy. Please take as directed and contact our office if you believe you are having problem(s) with the medication(s).   Please followup in 6 months  Health Maintenance, Male A healthy lifestyle and preventative care can promote health and wellness.  Maintain regular health, dental, and eye exams.  Eat a healthy diet. Foods like vegetables, fruits, whole grains, low-fat dairy products, and lean protein foods contain the nutrients you need and are low in calories. Decrease your intake of foods high in solid fats, added sugars, and salt. Get information about a proper diet from your health care provider, if necessary.  Regular physical exercise is one of the most important things you can do for your health. Most adults  should get at least 150 minutes of moderate-intensity exercise (any activity that increases your heart rate and causes you to sweat) each week. In addition, most adults need muscle-strengthening exercises on 2 or more days a week.   Maintain a healthy weight. The body mass index (BMI) is a screening tool to identify possible weight problems. It provides an estimate of body fat based on height and weight. Your health care provider can find your BMI and can help you achieve or maintain a healthy weight. For males 20 years and older:  A BMI below 18.5 is considered underweight.  A BMI of 18.5 to 24.9 is normal.  A BMI of 25 to 29.9 is considered overweight.  A BMI of 30 and above is considered obese.  Maintain normal blood lipids and cholesterol by exercising and minimizing your intake of saturated fat. Eat a balanced diet with plenty of fruits and vegetables. Blood tests for lipids and cholesterol should begin at age 17 and be repeated every 5 years. If your lipid or cholesterol levels are high, you are over age 86, or you are at high risk for heart disease, you may need your cholesterol levels checked more frequently.Ongoing high lipid and cholesterol levels should be treated with medicines if diet and exercise are not working.  If you smoke, find out from your health care provider how to quit. If you do not use tobacco, do not start.  Lung cancer screening is recommended for adults aged 76-80 years who are  at high risk for developing lung cancer because of a history of smoking. A yearly low-dose CT scan of the lungs is recommended for people who have at least a 30-pack-year history of smoking and are current smokers or have quit within the past 15 years. A pack year of smoking is smoking an average of 1 pack of cigarettes a day for 1 year (for example, a 30-pack-year history of smoking could mean smoking 1 pack a day for 30 years or 2 packs a day for 15 years). Yearly screening should continue  until the smoker has stopped smoking for at least 15 years. Yearly screening should be stopped for people who develop a health problem that would prevent them from having lung cancer treatment.  If you choose to drink alcohol, do not have more than 2 drinks per day. One drink is considered to be 12 oz (360 mL) of beer, 5 oz (150 mL) of wine, or 1.5 oz (45 mL) of liquor.  Avoid the use of street drugs. Do not share needles with anyone. Ask for help if you need support or instructions about stopping the use of drugs.  High blood pressure causes heart disease and increases the risk of stroke. High blood pressure is more likely to develop in:  People who have blood pressure in the end of the normal range (100-139/85-89 mm Hg).  People who are overweight or obese.  People who are African American.  If you are 73-62 years of age, have your blood pressure checked every 3-5 years. If you are 32 years of age or older, have your blood pressure checked every year. You should have your blood pressure measured twice-once when you are at a hospital or clinic, and once when you are not at a hospital or clinic. Record the average of the two measurements. To check your blood pressure when you are not at a hospital or clinic, you can use:  An automated blood pressure machine at a pharmacy.  A home blood pressure monitor.  If you are 50-24 years old, ask your health care provider if you should take aspirin to prevent heart disease.  Diabetes screening involves taking a blood sample to check your fasting blood sugar level. This should be done once every 3 years after age 90 if you are at a normal weight and without risk factors for diabetes. Testing should be considered at a younger age or be carried out more frequently if you are overweight and have at least 1 risk factor for diabetes.  Colorectal cancer can be detected and often prevented. Most routine colorectal cancer screening begins at the age of 43 and  continues through age 32. However, your health care provider may recommend screening at an earlier age if you have risk factors for colon cancer. On a yearly basis, your health care provider may provide home test kits to check for hidden blood in the stool. A small camera at the end of a tube may be used to directly examine the colon (sigmoidoscopy or colonoscopy) to detect the earliest forms of colorectal cancer. Talk to your health care provider about this at age 46 when routine screening begins. A direct exam of the colon should be repeated every 5-10 years through age 72, unless early forms of precancerous polyps or small growths are found.  People who are at an increased risk for hepatitis B should be screened for this virus. You are considered at high risk for hepatitis B if:  You were born in  a country where hepatitis B occurs often. Talk with your health care provider about which countries are considered high risk.  Your parents were born in a high-risk country and you have not received a shot to protect against hepatitis B (hepatitis B vaccine).  You have HIV or AIDS.  You use needles to inject street drugs.  You live with, or have sex with, someone who has hepatitis B.  You are a man who has sex with other men (MSM).  You get hemodialysis treatment.  You take certain medicines for conditions like cancer, organ transplantation, and autoimmune conditions.  Hepatitis C blood testing is recommended for all people born from [birth year] through 1963 and any individual with known risk factors for hepatitis C.  Healthy men should no longer receive prostate-specific antigen (PSA) blood tests as part of routine cancer screening. Talk to your health care provider about prostate cancer screening.  Testicular cancer screening is not recommended for adolescents or adult males who have no symptoms. Screening includes self-exam, a health care provider exam, and other screening tests. Consult with your  health care provider about any symptoms you have or any concerns you have about testicular cancer.  Practice safe sex. Use condoms and avoid high-risk sexual practices to reduce the spread of sexually transmitted infections (STIs).  You should be screened for STIs, including gonorrhea and chlamydia if:  You are sexually active and are younger than 24 years.  You are older than 24 years, and your health care provider tells you that you are at risk for this type of infection.  Your sexual activity has changed since you were last screened, and you are at an increased risk for chlamydia or gonorrhea. Ask your health care provider if you are at risk.  If you are at risk of being infected with HIV, it is recommended that you take a prescription medicine daily to prevent HIV infection. This is called pre-exposure prophylaxis (PrEP). You are considered at risk if:  You are a man who has sex with other men (MSM).  You are a heterosexual man who is sexually active with multiple partners.  You take drugs by injection.  You are sexually active with a partner who has HIV.  Talk with your health care provider about whether you are at high risk of being infected with HIV. If you choose to begin PrEP, you should first be tested for HIV. You should then be tested every 3 months for as long as you are taking PrEP.  Use sunscreen. Apply sunscreen liberally and repeatedly throughout the day. You should seek shade when your shadow is shorter than you. Protect yourself by wearing long sleeves, pants, a wide-brimmed hat, and sunglasses year round whenever you are outdoors.  Tell your health care provider of new moles or changes in moles, especially if there is a change in shape or color. Also, tell your health care provider if a mole is larger than the size of a pencil eraser.  A one-time screening for abdominal aortic aneurysm (AAA) and surgical repair of large AAAs by ultrasound is recommended for men aged  40-75 years who are current or former smokers.  Stay current with your vaccines (immunizations). This information is not intended to replace advice given to you by your health care provider. Make sure you discuss any questions you have with your health care provider. Document Released: 03/13/2008 Document Revised: 10/06/2014 Document Reviewed: 06/19/2015 Elsevier Interactive Patient Education  2017 [employer].

## 2016-09-03 NOTE — Assessment & Plan Note (Signed)
Sees Dr Ubaldo Glassing 4 times a year

## 2016-09-03 NOTE — Progress Notes (Signed)
Pre visit review using our clinic review tool, if applicable. No additional management support is needed unless otherwise documented below in the visit note. 

## 2016-09-03 NOTE — Assessment & Plan Note (Addendum)
Takes ativan only as needed - rare - maybe 4 times a year Has had panic attacks in the past Refilled today

## 2016-09-03 NOTE — Assessment & Plan Note (Signed)
BP well controlled Current regimen effective and well tolerated Continue current medications at current doses  

## 2016-09-05 ENCOUNTER — Encounter: Payer: Self-pay | Admitting: Emergency Medicine

## 2016-09-10 ENCOUNTER — Ambulatory Visit (INDEPENDENT_AMBULATORY_CARE_PROVIDER_SITE_OTHER): Payer: Medicare Other | Admitting: Podiatry

## 2016-09-10 ENCOUNTER — Encounter: Payer: Self-pay | Admitting: Podiatry

## 2016-09-10 DIAGNOSIS — M722 Plantar fascial fibromatosis: Secondary | ICD-10-CM | POA: Diagnosis not present

## 2016-09-10 DIAGNOSIS — M7661 Achilles tendinitis, right leg: Secondary | ICD-10-CM

## 2016-09-10 MED ORDER — TRIAMCINOLONE ACETONIDE 10 MG/ML IJ SUSP
10.0000 mg | Freq: Once | INTRAMUSCULAR | Status: AC
  Administered 2016-09-10: 10 mg

## 2016-09-10 NOTE — Progress Notes (Signed)
Subjective:     Patient ID: Brett Franklin, male   DOB: 07/03/1926, 82 y.o.   MRN: [id number]  HPI patient states the bottom of his right heel is doing well but is getting pain in the back of his right heel and he's not sure why it's hurting but it's been very tender for the last couple weeks   Review of Systems     Objective:   Physical Exam Neurovascular status intact muscle strength adequate with inflammatory changes of the Achilles tendon insertion medial side with no central and minimal lateral involvement. It is quite sore when pressed and patient still has plantar fascial symptomatology which is present but improved    Assessment:     Possibility for acute compensatory Achilles tendinitis right with plantar fascial condition improved but present    Plan:     H&P conditions reviewed and careful sheath injection administered right Achilles tendon after explaining chances for rupture with 3 mg dexamethasone Kenalog 5 mill grams Xylocaine and then applied night splint with instructions on usage along with instructions on heat and ice therapy. Gave instructions on reduced activity and also dispensed silicone sheeting to keep cushion on the back of the heel and will be seen back to recheck again in 3 weeks or earlier if needed

## 2016-09-26 ENCOUNTER — Telehealth: Payer: Self-pay | Admitting: Internal Medicine

## 2016-09-26 MED ORDER — SITAGLIPTIN PHOS-METFORMIN HCL 50-500 MG PO TABS: 1.0000 | ORAL_TABLET | Freq: Every day | ORAL | 1 refills | Status: DC

## 2016-09-26 NOTE — Telephone Encounter (Signed)
Pt request refill for JANUMET 50-500 MG tablet send to [facility]. Please call pt once its done.

## 2016-09-26 NOTE — Telephone Encounter (Signed)
Spoke with pt's wife to inform.  

## 2016-10-01 ENCOUNTER — Ambulatory Visit (INDEPENDENT_AMBULATORY_CARE_PROVIDER_SITE_OTHER): Payer: Medicare Other | Admitting: Podiatry

## 2016-10-01 ENCOUNTER — Encounter: Payer: Self-pay | Admitting: Podiatry

## 2016-10-01 ENCOUNTER — Ambulatory Visit: Payer: Medicare Other

## 2016-10-01 DIAGNOSIS — W19XXXA Unspecified fall, initial encounter: Secondary | ICD-10-CM | POA: Diagnosis not present

## 2016-10-01 DIAGNOSIS — M79676 Pain in unspecified toe(s): Secondary | ICD-10-CM

## 2016-10-01 DIAGNOSIS — R600 Localized edema: Secondary | ICD-10-CM

## 2016-10-01 DIAGNOSIS — M722 Plantar fascial fibromatosis: Secondary | ICD-10-CM | POA: Diagnosis not present

## 2016-10-01 NOTE — Progress Notes (Signed)
Subjective:     Patient ID: Brett Franklin, male   DOB: [birth year], 83 y.o.   MRN: [id number]  HPI patient presents with his wife stating that he did traumatize his right foot and was concerned and heel pain has gone away that he had previously   Review of Systems     Objective:   Physical Exam Neurovascular status unchanged from previous visit with patient found to have edema in the right medial foot and ankle +1 pitting with negative Homans sign noted. It is mildly tender when pressed with no acute pain    Assessment:     Probability for trauma to the right foot creating inflammation with edema present    Plan:     Explained to him ice therapy using Ace wrap to protect along with anklet usage for compression. If symptoms persist we will need to consider other treatment but it should respond uneventful

## 2017-01-13 ENCOUNTER — Ambulatory Visit (INDEPENDENT_AMBULATORY_CARE_PROVIDER_SITE_OTHER): Payer: Medicare Other | Admitting: Internal Medicine

## 2017-01-13 ENCOUNTER — Encounter: Payer: Self-pay | Admitting: Internal Medicine

## 2017-01-13 VITALS — BP 110/60 | HR 47 | Ht 69.0 in | Wt 178.0 lb

## 2017-01-13 DIAGNOSIS — M79671 Pain in right foot: Secondary | ICD-10-CM | POA: Diagnosis not present

## 2017-01-13 MED ORDER — METOPROLOL TARTRATE 25 MG PO TABS: ORAL_TABLET | ORAL | 1 refills | Status: DC

## 2017-01-13 NOTE — Progress Notes (Signed)
Subjective:    Patient ID: Brett Franklin, male    DOB: 1926-09-21, 83 y.o.   MRN: [id number]  HPI He is here for an acute visit.   Last October he started having foot problems.  He was diagnosed with tendinitis.  He fell on it and saw ortho - Dr Lynann Bologna.  She told him it was a stress fx and he was in a boot.  He went to rehab for several weeks.  The foot has healed.  The foot/ankle is still swelling and there is still some pain. He has pain in the heel.  His balance has been bad and he is walking with a limp since the foot fracture.  His toes are also curling under.  He feels that he has difficulty picking up his foot and walking normally.    He was not sure if he should see podiatry or orthopedics or if there was something I could do.    Medications and allergies reviewed with patient and updated if appropriate.  Patient Active Problem List   Diagnosis Date Noted  . Elevated TSH 05/03/2015  . Hyperlipidemia 07/21/2011  . OTHER DYSPHAGIA 03/04/2010  . History of stroke 12/31/2009  . LOW BACK PAIN SYNDROME 11/12/2009  . CERVICAL RADICULOPATHY, LEFT 05/11/2008  . NEPHROLITHIASIS, HX OF 05/11/2008  . Diabetes mellitus with neurological manifestations, controlled ([place]) 11/16/2007  . Anxiety 07/08/2007  . Melanoma of skin ([place]) 01/21/2007  . Essential hypertension 01/21/2007  . IBS 01/21/2007  . BPH (benign prostatic hyperplasia) 01/21/2007  . STENOSIS, CERVICAL SPINAL 01/21/2007  . History of adenomatous polyp of colon 01/21/2007    Current Outpatient Prescriptions on File Prior to Visit  Medication Sig Dispense Refill  . amLODipine (NORVASC) 5 MG tablet TAKE 1 TABLET BY MOUTH EVERY DAY 90 tablet 2  . aspirin 325 MG tablet Take 325 mg by mouth daily.      Marland Kitchen LORazepam (ATIVAN) 0.5 MG tablet Take 1 tablet (0.5 mg total) by mouth every 12 (twelve) hours as needed for anxiety. 20 tablet 0  . Multiple Vitamins-Minerals (MACULAR VITAMIN BENEFIT PO) Take by mouth 2 (two) times daily.      . simvastatin (ZOCOR) 10 MG tablet Take 1 tablet by mouth 4 times a week. 48 tablet 1  . sitaGLIPtin-metformin (JANUMET) 50-500 MG tablet Take 1 tablet by mouth daily. 90 tablet 1  . valACYclovir (VALTREX) 1000 MG tablet TK 2 TS PO BID FOR 1 DAY FOR FEVER BLISTER  3   No current facility-administered medications on file prior to visit.     Past Medical History:  Diagnosis Date  . Adenomatous polyp of colon 11/1996   Dr Fuller Plan  . Anxiety   . BPH (benign prostatic hyperplasia)   . Cervical spinal stenosis   . CVA (cerebral vascular accident) ([place])   . Diabetes mellitus, type 2 ([place])   . GERD (gastroesophageal reflux disease)   . Hemorrhoids   . Hypertension   . IBS (irritable bowel syndrome)   . Melanoma ([place])   . Nephrolithiasis     X 4    Past Surgical History:  Procedure Laterality Date  . CATARACT EXTRACTION Left   . CHOLECYSTECTOMY    . COLONOSCOPY W/ POLYPECTOMY     Dr.Stark  . CYSTOSCOPY     Dr.Kimbrough     Social History   Social History  . Marital status: Married    Spouse name: N/A  . Number of children: 2  . Years of education: N/A  Occupational History  . retired Retired   Social History Main Topics  . Smoking status: Never Smoker  . Smokeless tobacco: Never Used  . Alcohol use No  . Drug use: No  . Sexual activity: Not on file   Other Topics Concern  . Not on file   Social History Narrative  . No narrative on file    Family History  Problem Relation Age of Onset  . Alcohol abuse Father   . Heart disease Father   . Heart attack Maternal Grandfather 65  . Diabetes type I      grandson  . Cancer Neg Hx   . Stroke Neg Hx     Review of Systems  Cardiovascular: Positive for leg swelling.  Musculoskeletal: Positive for gait problem. Negative for back pain.  Skin: Negative for color change and rash.  Neurological: Positive for weakness. Negative for numbness.       Objective:   Vitals:   01/13/17 1310  BP: 110/60  Pulse: (!) 47    Filed Weights   01/13/17 1310  Weight: 178 lb (80.7 kg)   Body mass index is 26.29 kg/m.  Wt Readings from Last 3 Encounters:  01/13/17 178 lb (80.7 kg)  09/03/16 178 lb (80.7 kg)  02/04/16 178 lb (80.7 kg)     Physical Exam  Constitutional: He appears well-developed and well-nourished. No distress.  HENT:  Head: Normocephalic and atraumatic.  Musculoskeletal: He exhibits edema (mild pitting edema right lower leg/ankle).  Toes on right foot curling under; achilles tendinitis on right, no dorsal or plantar foot pain with palpation, no heel pain with palpation  Neurological:  Normal sensation right foot  Skin: Skin is warm and dry. No rash noted. He is not diaphoretic. No erythema.          Assessment & Plan:   See Problem List for Assessment and Plan of chronic medical problems.

## 2017-01-13 NOTE — Assessment & Plan Note (Signed)
Mild pain associated with swelling, weakness, toes curling under Possible achilles tendinitis, ? Foot drop, toes curling under, mild pain, lower leg swelling He is having more than one foot problem and I have recommended he follow up with Dr Lynann Bologna for further evaluation

## 2017-01-13 NOTE — Progress Notes (Signed)
Pre visit review using our clinic review tool, if applicable. No additional management support is needed unless otherwise documented below in the visit note. 

## 2017-01-13 NOTE — Patient Instructions (Addendum)
Follow up with Dr Lynann Bologna.  \

## 2017-02-10 ENCOUNTER — Other Ambulatory Visit: Payer: Self-pay | Admitting: Internal Medicine

## 2017-03-07 ENCOUNTER — Other Ambulatory Visit: Payer: Self-pay | Admitting: Internal Medicine

## 2017-03-11 ENCOUNTER — Other Ambulatory Visit: Payer: Self-pay | Admitting: Internal Medicine

## 2017-03-14 ENCOUNTER — Other Ambulatory Visit: Payer: Self-pay | Admitting: Internal Medicine

## 2017-03-17 ENCOUNTER — Telehealth: Payer: Self-pay | Admitting: Internal Medicine

## 2017-03-17 MED ORDER — SITAGLIPTIN PHOS-METFORMIN HCL 50-500 MG PO TABS: 1.0000 | ORAL_TABLET | Freq: Every day | ORAL | 0 refills | Status: DC

## 2017-03-17 NOTE — Telephone Encounter (Signed)
[facility] did not receive the prescription for sitaGLIPtin-metformin (JANUMET) 50-500 MG tablet. Can this be resent?

## 2017-03-17 NOTE — Telephone Encounter (Signed)
Spoke with pts wife, states pharmacy now had rx

## 2017-03-17 NOTE — Telephone Encounter (Signed)
RX has been resent

## 2017-05-13 LAB — HM DIABETES EYE EXAM

## 2017-05-15 ENCOUNTER — Encounter: Payer: Self-pay | Admitting: Internal Medicine

## 2017-05-15 NOTE — Progress Notes (Signed)
Abstracted and sent to scan  

## 2017-06-03 ENCOUNTER — Encounter: Payer: Self-pay | Admitting: Internal Medicine

## 2017-06-09 ENCOUNTER — Other Ambulatory Visit: Payer: Self-pay | Admitting: Internal Medicine

## 2017-06-10 ENCOUNTER — Other Ambulatory Visit: Payer: Self-pay | Admitting: Internal Medicine

## 2017-06-11 ENCOUNTER — Other Ambulatory Visit: Payer: Self-pay | Admitting: Internal Medicine

## 2017-06-11 NOTE — Patient Instructions (Addendum)
  Test(s) ordered today. Your results will be released to [place] (or called to you) after review, usually within 72hours after test completion. If any changes need to be made, you will be notified at that same time.  All other Health Maintenance issues reviewed.   All recommended immunizations and age-appropriate screenings are up-to-date or discussed.  Flu immunization administered today.   Medications reviewed and updated.  No changes recommended at this time.   Please followup in 3 months

## 2017-06-11 NOTE — Progress Notes (Signed)
Subjective:    Patient ID: Brett Franklin, male    DOB: July 05, 1926, 83 y.o.   MRN: [id number]  HPI The patient is here for follow up.  Diabetes: He is taking his medication daily as prescribed. He is compliant with a diabetic diet. He is exercising regularly. He monitors his sugars and they have been running around 130. He checks his feet daily and denies foot lesions. He is up-to-date with an ophthalmology examination.   Hypertension: He is taking his medication daily. He is compliant with a low sodium diet.  He denies chest pain, palpitations, edema, shortness of breath and regular headaches. He is exercising regularly.  He does monitor his blood pressure at home and it is well controlled.    Hyperlipidemia: He is taking his medication daily. He is compliant with a low fat/cholesterol diet. He is exercising regularly. He denies myalgias.   Anxiety: He takes the ativan rarely for anxiety. He denies any side effects from the medication. He feels his anxiety is well controlled when he takes the ativan.   He walks 1/2 mile daily or goes to the gym and rides the back.    His balance is a little worse after a recent foot injury.  He is still exercising regularly and is doing balance exercises.   He has done PT x 2 and it has helped.   Medications and allergies reviewed with patient and updated if appropriate.  Patient Active Problem List   Diagnosis Date Noted  . Right foot pain 01/13/2017  . Elevated TSH 05/03/2015  . Hyperlipidemia 07/21/2011  . OTHER DYSPHAGIA 03/04/2010  . History of stroke 12/31/2009  . LOW BACK PAIN SYNDROME 11/12/2009  . CERVICAL RADICULOPATHY, LEFT 05/11/2008  . NEPHROLITHIASIS, HX OF 05/11/2008  . Diabetes mellitus with neurological manifestations, controlled ([place]) 11/16/2007  . Anxiety 07/08/2007  . Melanoma of skin ([place]) 01/21/2007  . Essential hypertension 01/21/2007  . IBS 01/21/2007  . BPH (benign prostatic hyperplasia) 01/21/2007  . STENOSIS,  CERVICAL SPINAL 01/21/2007  . History of adenomatous polyp of colon 01/21/2007    Current Outpatient Prescriptions on File Prior to Visit  Medication Sig Dispense Refill  . amLODipine (NORVASC) 5 MG tablet TAKE 1 TABLET BY MOUTH EVERY DAY 90 tablet 1  . aspirin 325 MG tablet Take 325 mg by mouth daily.      Marland Kitchen LORazepam (ATIVAN) 0.5 MG tablet Take 1 tablet (0.5 mg total) by mouth every 12 (twelve) hours as needed for anxiety. 20 tablet 0  . metoprolol tartrate (LOPRESSOR) 25 MG tablet TAKE ONE-HALF TABLET BY MOUTH EVERY 12 HOURS, THEN INCREASE TO 1 TABLET BY MOUTH TWICE DAILY IF BLOOD PRESSURE IS ABOVE 140/90 324 tablet 1  . Multiple Vitamins-Minerals (MACULAR VITAMIN BENEFIT PO) Take by mouth 2 (two) times daily.    . simvastatin (ZOCOR) 10 MG tablet Take 1 tablet by mouth 4 times a week. 48 tablet 1  . valACYclovir (VALTREX) 1000 MG tablet TK 2 TS PO BID FOR 1 DAY FOR FEVER BLISTER  3   No current facility-administered medications on file prior to visit.     Past Medical History:  Diagnosis Date  . Adenomatous polyp of colon 11/1996   Dr Fuller Plan  . Anxiety   . BPH (benign prostatic hyperplasia)   . Cervical spinal stenosis   . CVA (cerebral vascular accident) ([place])   . Diabetes mellitus, type 2 ([place])   . GERD (gastroesophageal reflux disease)   . Hemorrhoids   . Hypertension   .  IBS (irritable bowel syndrome)   . Melanoma ([place])   . Nephrolithiasis     X 4    Past Surgical History:  Procedure Laterality Date  . CATARACT EXTRACTION Left   . CHOLECYSTECTOMY    . COLONOSCOPY W/ POLYPECTOMY     Dr.Stark  . CYSTOSCOPY     Dr.Kimbrough     Social History   Social History  . Marital status: Married    Spouse name: N/A  . Number of children: 2  . Years of education: N/A   Occupational History  . retired Retired   Social History Main Topics  . Smoking status: Never Smoker  . Smokeless tobacco: Never Used  . Alcohol use No  . Drug use: No  . Sexual activity: Not Asked     Other Topics Concern  . None   Social History Narrative  . None    Family History  Problem Relation Age of Onset  . Alcohol abuse Father   . Heart disease Father   . Heart attack Maternal Grandfather 65  . Diabetes type I Unknown        grandson  . Cancer Neg Hx   . Stroke Neg Hx     Review of Systems  Constitutional: Negative for chills and fever.  Eyes: Negative for visual disturbance.  Respiratory: Negative for cough, shortness of breath and wheezing.   Cardiovascular: Negative for chest pain, palpitations and leg swelling.  Gastrointestinal: Negative for abdominal pain.  Neurological: Negative for light-headedness and headaches.       Objective:   Vitals:   06/12/17 0831  BP: (!) 146/82  Pulse: 62  Resp: 16  Temp: 97.7 F (36.5 C)  SpO2: 98%   Wt Readings from Last 3 Encounters:  06/12/17 178 lb (80.7 kg)  01/13/17 178 lb (80.7 kg)  09/03/16 178 lb (80.7 kg)   Body mass index is 26.29 kg/m.   Physical Exam    Constitutional: Appears well-developed and well-nourished. No distress.  HENT:  Head: Normocephalic and atraumatic.  Neck: Neck supple. No tracheal deviation present. No thyromegaly present.  No cervical lymphadenopathy Cardiovascular: Normal rate, regular rhythm and normal heart sounds.   No murmur heard. No carotid bruit .  No edema Pulmonary/Chest: Effort normal and breath sounds normal. No respiratory distress. No has no wheezes. No rales.  Skin: Skin is warm and dry. Not diaphoretic.  Psychiatric: Normal mood and affect. Behavior is normal.      Assessment & Plan:    See Problem List for Assessment and Plan of chronic medical problems.

## 2017-06-12 ENCOUNTER — Encounter: Payer: Self-pay | Admitting: Internal Medicine

## 2017-06-12 ENCOUNTER — Other Ambulatory Visit (INDEPENDENT_AMBULATORY_CARE_PROVIDER_SITE_OTHER): Payer: Medicare Other

## 2017-06-12 ENCOUNTER — Ambulatory Visit (INDEPENDENT_AMBULATORY_CARE_PROVIDER_SITE_OTHER): Payer: Medicare Other | Admitting: Internal Medicine

## 2017-06-12 VITALS — BP 146/82 | HR 62 | Temp 97.7°F | Resp 16 | Ht 69.0 in | Wt 178.0 lb

## 2017-06-12 DIAGNOSIS — F419 Anxiety disorder, unspecified: Secondary | ICD-10-CM

## 2017-06-12 DIAGNOSIS — E1149 Type 2 diabetes mellitus with other diabetic neurological complication: Secondary | ICD-10-CM

## 2017-06-12 DIAGNOSIS — I10 Essential (primary) hypertension: Secondary | ICD-10-CM

## 2017-06-12 DIAGNOSIS — E7800 Pure hypercholesterolemia, unspecified: Secondary | ICD-10-CM | POA: Diagnosis not present

## 2017-06-12 DIAGNOSIS — Z23 Encounter for immunization: Secondary | ICD-10-CM

## 2017-06-12 LAB — COMPREHENSIVE METABOLIC PANEL
ALBUMIN: 4.2 g/dL (ref 3.5–5.2)
ALT: 10 U/L (ref 0–53)
AST: 15 U/L (ref 0–37)
Alkaline Phosphatase: 42 U/L (ref 39–117)
BILIRUBIN TOTAL: 0.5 mg/dL (ref 0.2–1.2)
BUN: 25 mg/dL — ABNORMAL HIGH (ref 6–23)
CALCIUM: 9.4 mg/dL (ref 8.4–10.5)
CHLORIDE: 103 meq/L (ref 96–112)
CO2: 26 meq/L (ref 19–32)
CREATININE: 1.28 mg/dL (ref 0.40–1.50)
GFR: 56.18 mL/min — AB (ref >60.00)
Glucose, Bld: 139 mg/dL — ABNORMAL HIGH (ref 70–99)
Potassium: 4.4 mEq/L (ref 3.5–5.1)
Sodium: 138 mEq/L (ref 135–145)
TOTAL PROTEIN: 7.3 g/dL (ref 6.0–8.3)

## 2017-06-12 LAB — LIPID PANEL
CHOLESTEROL: 107 mg/dL (ref 0–200)
HDL: 78.2 mg/dL (ref >39.00)
LDL Cholesterol: 19 mg/dL (ref 0–99)
NonHDL: 29
TRIGLYCERIDES: 48 mg/dL (ref 0.0–149.0)
Total CHOL/HDL Ratio: 1
VLDL: 9.6 mg/dL (ref 0.0–40.0)

## 2017-06-12 LAB — HEMOGLOBIN A1C: HEMOGLOBIN A1C: 6.7 % — AB (ref 4.6–6.5)

## 2017-06-12 MED ORDER — SITAGLIPTIN PHOS-METFORMIN HCL 50-500 MG PO TABS: 1.0000 | ORAL_TABLET | Freq: Every day | ORAL | 1 refills | Status: DC

## 2017-06-12 NOTE — Assessment & Plan Note (Signed)
Controlled, stable Continue ativan as needed at current dose

## 2017-06-12 NOTE — Assessment & Plan Note (Signed)
BP elevated here but well controlled at home BP well controlled at home Current regimen effective and well tolerated Continue current medications at current doses cmp

## 2017-06-12 NOTE — Assessment & Plan Note (Signed)
Check lipid panel  Continue daily statin Regular exercise and healthy diet encouraged, which he is doing

## 2017-06-12 NOTE — Assessment & Plan Note (Signed)
Check a1c Continue healthy diet and regular exercise continue janument

## 2017-08-10 ENCOUNTER — Other Ambulatory Visit: Payer: Self-pay | Admitting: Internal Medicine

## 2017-09-01 ENCOUNTER — Other Ambulatory Visit: Payer: Self-pay | Admitting: Internal Medicine

## 2017-09-08 ENCOUNTER — Encounter: Payer: Medicare Other | Admitting: Internal Medicine

## 2017-10-05 ENCOUNTER — Encounter: Payer: Self-pay | Admitting: Internal Medicine

## 2017-10-05 NOTE — Patient Instructions (Addendum)
Test(s) ordered today. Your results will be released to [place] (or called to you) after review, usually within 72hours after test completion. If any changes need to be made, you will be notified at that same time.  All other Health Maintenance issues reviewed.   All recommended immunizations and age-appropriate screenings are up-to-date or discussed.  No immunizations administered today.   Medications reviewed and updated.  No changes recommended at this time.    Please followup in 3 months    Health Maintenance, Male A healthy lifestyle and preventive care is important for your health and wellness. Ask your health care provider about what schedule of regular examinations is right for you. What should I know about weight and diet? Eat a Healthy Diet  Eat plenty of vegetables, fruits, whole grains, low-fat dairy products, and lean protein.  Do not eat a lot of foods high in solid fats, added sugars, or salt.  Maintain a Healthy Weight Regular exercise can help you achieve or maintain a healthy weight. You should:  Do at least 150 minutes of exercise each week. The exercise should increase your heart rate and make you sweat (moderate-intensity exercise).  Do strength-training exercises at least twice a week.  Watch Your Levels of Cholesterol and Blood Lipids  Have your blood tested for lipids and cholesterol every 5 years starting at 82 years of age. If you are at high risk for heart disease, you should start having your blood tested when you are 82 years old. You may need to have your cholesterol levels checked more often if: ? Your lipid or cholesterol levels are high. ? You are older than 82 years of age. ? You are at high risk for heart disease.  What should I know about cancer screening? Many types of cancers can be detected early and may often be prevented. Lung Cancer  You should be screened every year for lung cancer if: ? You are a current smoker who has smoked for  at least 30 years. ? You are a former smoker who has quit within the past 15 years.  Talk to your health care provider about your screening options, when you should start screening, and how often you should be screened.  Colorectal Cancer  Routine colorectal cancer screening usually begins at 82 years of age and should be repeated every 5-10 years until you are 82 years old. You may need to be screened more often if early forms of precancerous polyps or small growths are found. Your health care provider may recommend screening at an earlier age if you have risk factors for colon cancer.  Your health care provider may recommend using home test kits to check for hidden blood in the stool.  A small camera at the end of a tube can be used to examine your colon (sigmoidoscopy or colonoscopy). This checks for the earliest forms of colorectal cancer.  Prostate and Testicular Cancer  Depending on your age and overall health, your health care provider may do certain tests to screen for prostate and testicular cancer.  Talk to your health care provider about any symptoms or concerns you have about testicular or prostate cancer.  Skin Cancer  Check your skin from head to toe regularly.  Tell your health care provider about any new moles or changes in moles, especially if: ? There is a change in a mole's size, shape, or color. ? You have a mole that is larger than a pencil eraser.  Always use sunscreen. Apply sunscreen  liberally and repeat throughout the day.  Protect yourself by wearing long sleeves, pants, a wide-brimmed hat, and sunglasses when outside.  What should I know about heart disease, diabetes, and high blood pressure?  If you are 18-39 years of age, have your blood pressure checked every 3-5 years. If you are 40 years of age or older, have your blood pressure checked every year. You should have your blood pressure measured twice-once when you are at a hospital or clinic, and once  when you are not at a hospital or clinic. Record the average of the two measurements. To check your blood pressure when you are not at a hospital or clinic, you can use: ? An automated blood pressure machine at a pharmacy. ? A home blood pressure monitor.  Talk to your health care provider about your target blood pressure.  If you are between 45-79 years old, ask your health care provider if you should take aspirin to prevent heart disease.  Have regular diabetes screenings by checking your fasting blood sugar level. ? If you are at a normal weight and have a low risk for diabetes, have this test once every three years after the age of 45. ? If you are overweight and have a high risk for diabetes, consider being tested at a younger age or more often.  A one-time screening for abdominal aortic aneurysm (AAA) by ultrasound is recommended for men aged 65-75 years who are current or former smokers. What should I know about preventing infection? Hepatitis B If you have a higher risk for hepatitis B, you should be screened for this virus. Talk with your health care provider to find out if you are at risk for hepatitis B infection. Hepatitis C Blood testing is recommended for:  Everyone born from 1945 through 1965.  Anyone with known risk factors for hepatitis C.  Sexually Transmitted Diseases (STDs)  You should be screened each year for STDs including gonorrhea and chlamydia if: ? You are sexually active and are younger than 82 years of age. ? You are older than 82 years of age and your health care provider tells you that you are at risk for this type of infection. ? Your sexual activity has changed since you were last screened and you are at an increased risk for chlamydia or gonorrhea. Ask your health care provider if you are at risk.  Talk with your health care provider about whether you are at high risk of being infected with HIV. Your health care provider may recommend a prescription  medicine to help prevent HIV infection.  What else can I do?  Schedule regular health, dental, and eye exams.  Stay current with your vaccines (immunizations).  Do not use any tobacco products, such as cigarettes, chewing tobacco, and e-cigarettes. If you need help quitting, ask your health care provider.  Limit alcohol intake to no more than 2 drinks per day. One drink equals 12 ounces of beer, 5 ounces of wine, or 1 ounces of hard liquor.  Do not use street drugs.  Do not share needles.  Ask your health care provider for help if you need support or information about quitting drugs.  Tell your health care provider if you often feel depressed.  Tell your health care provider if you have ever been abused or do not feel safe at home. This information is not intended to replace advice given to you by your health care provider. Make sure you discuss any questions you have with your   health care provider. Document Released: 03/13/2008 Document Revised: 05/14/2016 Document Reviewed: 06/19/2015 Elsevier Interactive Patient Education  [employer].

## 2017-10-05 NOTE — Progress Notes (Signed)
Subjective:    Patient ID: Brett Franklin, male    DOB: 1926/09/09, 84 y.o.   MRN: [id number]  HPI He is here for a physical exam.     Medications and allergies reviewed with patient and updated if appropriate.  Patient Active Problem List   Diagnosis Date Noted  . Right foot pain 01/13/2017  . Elevated TSH 05/03/2015  . Hyperlipidemia 07/21/2011  . OTHER DYSPHAGIA 03/04/2010  . History of stroke 12/31/2009  . LOW BACK PAIN SYNDROME 11/12/2009  . CERVICAL RADICULOPATHY, LEFT 05/11/2008  . NEPHROLITHIASIS, HX OF 05/11/2008  . Diabetes mellitus with neurological manifestations, controlled ([place]) 11/16/2007  . Anxiety 07/08/2007  . Melanoma of skin ([place]) 01/21/2007  . Essential hypertension 01/21/2007  . IBS 01/21/2007  . BPH (benign prostatic hyperplasia) 01/21/2007  . STENOSIS, CERVICAL SPINAL 01/21/2007  . History of adenomatous polyp of colon 01/21/2007    Current Outpatient Medications on File Prior to Visit  Medication Sig Dispense Refill  . amLODipine (NORVASC) 5 MG tablet TAKE 1 TABLET BY MOUTH EVERY DAY 90 tablet 1  . aspirin 325 MG tablet Take 325 mg by mouth daily.      Marland Kitchen LORazepam (ATIVAN) 0.5 MG tablet Take 1 tablet (0.5 mg total) by mouth every 12 (twelve) hours as needed for anxiety. 20 tablet 0  . metoprolol tartrate (LOPRESSOR) 25 MG tablet TAKE ONE-HALF TABLET BY MOUTH EVERY 12 HOURS, THEN INCREASE TO 1 TABLET BY MOUTH TWICE DAILY IF BLOOD PRESSURE IS ABOVE 140/90 324 tablet 1  . Multiple Vitamins-Minerals (MACULAR VITAMIN BENEFIT PO) Take by mouth 2 (two) times daily.    . simvastatin (ZOCOR) 10 MG tablet TAKE 1 TABLET BY MOUTH DAILY AT BEDTIME, EXCEPT 1/2 TABLET ON TUESDAY AND SATURDAY 144 tablet 1  . sitaGLIPtin-metformin (JANUMET) 50-500 MG tablet Take 1 tablet by mouth daily. 90 tablet 1  . valACYclovir (VALTREX) 1000 MG tablet TK 2 TS PO BID FOR 1 DAY FOR FEVER BLISTER  3   No current facility-administered medications on file prior to visit.      Past Medical History:  Diagnosis Date  . Adenomatous polyp of colon 11/1996   Dr Fuller Plan  . Anxiety   . BPH (benign prostatic hyperplasia)   . Cervical spinal stenosis   . CVA (cerebral vascular accident) ([place])   . Diabetes mellitus, type 2 ([place])   . GERD (gastroesophageal reflux disease)   . Hemorrhoids   . Hypertension   . IBS (irritable bowel syndrome)   . Melanoma ([place])   . Nephrolithiasis     X 4    Past Surgical History:  Procedure Laterality Date  . CATARACT EXTRACTION Left   . CHOLECYSTECTOMY    . COLONOSCOPY W/ POLYPECTOMY     Dr.Stark  . CYSTOSCOPY     Dr.Kimbrough     Social History   Socioeconomic History  . Marital status: Married    Spouse name: None  . Number of children: 2  . Years of education: None  . Highest education level: None  Social Needs  . Financial resource strain: None  . Food insecurity - worry: None  . Food insecurity - inability: None  . Transportation needs - medical: None  . Transportation needs - non-medical: None  Occupational History  . Occupation: retired    Fish farm manager: RETIRED  Tobacco Use  . Smoking status: Never Smoker  . Smokeless tobacco: Never Used  Substance and Sexual Activity  . Alcohol use: No  . Drug use: No  .  Sexual activity: None  Other Topics Concern  . None  Social History Narrative  . None    Family History  Problem Relation Age of Onset  . Alcohol abuse Father   . Heart disease Father   . Heart attack Maternal Grandfather 65  . Diabetes type I Unknown        grandson  . Cancer Neg Hx   . Stroke Neg Hx     Review of Systems  Constitutional: Negative for chills and fever.  Eyes: Negative for visual disturbance.  Respiratory: Negative for cough, shortness of breath and wheezing.   Cardiovascular: Negative for chest pain, palpitations and leg swelling.  Gastrointestinal: Negative for abdominal pain, blood in stool, constipation, diarrhea and nausea.       No gerd  Genitourinary: Negative  for dysuria and hematuria.  Musculoskeletal: Negative for arthralgias ( ) and back pain.  Skin: Negative for color change and rash.  Neurological: Negative for dizziness, light-headedness and headaches.  Psychiatric/Behavioral: Positive for sleep disturbance (wakes often, but can get back to sleep). Negative for dysphoric mood. The patient is nervous/anxious (takes ativan as needed).        Objective:   Vitals:   10/06/17 0755  BP: 128/74  Pulse: (!) 53  Resp: 16  Temp: (!) 97.3 F (36.3 C)  SpO2: 98%   Filed Weights   10/06/17 0755  Weight: 176 lb (79.8 kg)   Body mass index is 25.99 kg/m.  Wt Readings from Last 3 Encounters:  10/06/17 176 lb (79.8 kg)  06/12/17 178 lb (80.7 kg)  01/13/17 178 lb (80.7 kg)     Physical Exam Constitutional: He appears well-developed and well-nourished. No distress.  HENT:  Head: Normocephalic and atraumatic.  Right Ear: External ear normal.  Left Ear: External ear normal.  Mouth/Throat: Oropharynx is clear and moist.  Normal ear canals and TM b/l  Eyes: Conjunctivae and EOM are normal.  Neck: Neck supple. No tracheal deviation present. No thyromegaly present.  No carotid bruit  Cardiovascular: Normal rate, regular rhythm, normal heart sounds and intact distal pulses.   No murmur heard. Pulmonary/Chest: Effort normal and breath sounds normal. No respiratory distress. He has no wheezes. He has no rales.  Abdominal: Soft. He exhibits no distension. There is no tenderness.  Genitourinary: deferred  Musculoskeletal: He exhibits no edema.  Lymphadenopathy:   He has no cervical adenopathy.  Skin: Skin is warm and dry. He is not diaphoretic.  Psychiatric: He has a normal mood and affect. His behavior is normal.         Assessment & Plan:   Physical exam: Screening blood work   ordered Immunizations  Discussed shingrix, others up to date Colonoscopy  - no longer needed due to age Eye exams   Up to date  EKG   Last done  08/2016 Exercise  Walks every morning 1/2 mile Weight  BMI is very good. Skin no concerns - sees dr lomax regularly Substance abuse   none  See Problem List for Assessment and Plan of chronic medical problems.   FU in 3 months

## 2017-10-06 ENCOUNTER — Ambulatory Visit (INDEPENDENT_AMBULATORY_CARE_PROVIDER_SITE_OTHER): Payer: Medicare Other | Admitting: Internal Medicine

## 2017-10-06 ENCOUNTER — Other Ambulatory Visit (INDEPENDENT_AMBULATORY_CARE_PROVIDER_SITE_OTHER): Payer: Medicare Other

## 2017-10-06 ENCOUNTER — Encounter: Payer: Self-pay | Admitting: Internal Medicine

## 2017-10-06 VITALS — BP 128/74 | HR 53 | Temp 97.3°F | Resp 16 | Ht 69.0 in | Wt 176.0 lb

## 2017-10-06 DIAGNOSIS — R7989 Other specified abnormal findings of blood chemistry: Secondary | ICD-10-CM

## 2017-10-06 DIAGNOSIS — E1149 Type 2 diabetes mellitus with other diabetic neurological complication: Secondary | ICD-10-CM | POA: Diagnosis not present

## 2017-10-06 DIAGNOSIS — F419 Anxiety disorder, unspecified: Secondary | ICD-10-CM | POA: Diagnosis not present

## 2017-10-06 DIAGNOSIS — E7849 Other hyperlipidemia: Secondary | ICD-10-CM

## 2017-10-06 DIAGNOSIS — Z0000 Encounter for general adult medical examination without abnormal findings: Secondary | ICD-10-CM

## 2017-10-06 DIAGNOSIS — I10 Essential (primary) hypertension: Secondary | ICD-10-CM | POA: Diagnosis not present

## 2017-10-06 DIAGNOSIS — C439 Malignant melanoma of skin, unspecified: Secondary | ICD-10-CM

## 2017-10-06 LAB — COMPREHENSIVE METABOLIC PANEL
ALK PHOS: 43 U/L (ref 39–117)
ALT: 9 U/L (ref 0–53)
AST: 14 U/L (ref 0–37)
Albumin: 4.4 g/dL (ref 3.5–5.2)
BUN: 20 mg/dL (ref 6–23)
CALCIUM: 9.4 mg/dL (ref 8.4–10.5)
CHLORIDE: 102 meq/L (ref 96–112)
CO2: 29 meq/L (ref 19–32)
CREATININE: 1.14 mg/dL (ref 0.40–1.50)
GFR: 64.16 mL/min (ref >60.00)
Glucose, Bld: 162 mg/dL — ABNORMAL HIGH (ref 70–99)
POTASSIUM: 4.6 meq/L (ref 3.5–5.1)
Sodium: 138 mEq/L (ref 135–145)
Total Bilirubin: 1 mg/dL (ref 0.2–1.2)
Total Protein: 7.1 g/dL (ref 6.0–8.3)

## 2017-10-06 LAB — CBC WITH DIFFERENTIAL/PLATELET
BASOS ABS: 0 10*3/uL (ref 0.0–0.1)
Basophils Relative: 0.4 % (ref 0.0–3.0)
EOS ABS: 0.1 10*3/uL (ref 0.0–0.7)
Eosinophils Relative: 0.8 % (ref 0.0–5.0)
HCT: 47.5 % (ref 39.0–52.0)
Hemoglobin: 15.6 g/dL (ref 13.0–17.0)
LYMPHS ABS: 1.2 10*3/uL (ref 0.7–4.0)
Lymphocytes Relative: 14.8 % (ref 12.0–46.0)
MCHC: 32.9 g/dL (ref 30.0–36.0)
MCV: 99.3 fl (ref 78.0–100.0)
MONO ABS: 0.8 10*3/uL (ref 0.1–1.0)
Monocytes Relative: 10.1 % (ref 3.0–12.0)
NEUTROS PCT: 73.9 % (ref 43.0–77.0)
Neutro Abs: 6.1 10*3/uL (ref 1.4–7.7)
Platelets: 197 10*3/uL (ref 150.0–400.0)
RBC: 4.78 Mil/uL (ref 4.22–5.81)
RDW: 14.9 % (ref 11.5–15.5)
WBC: 8.3 10*3/uL (ref 4.0–10.5)

## 2017-10-06 LAB — LIPID PANEL
Cholesterol: 111 mg/dL (ref 0–200)
HDL: 66.7 mg/dL (ref >39.00)
LDL CALC: 36 mg/dL (ref 0–99)
NONHDL: 44.26
Total CHOL/HDL Ratio: 2
Triglycerides: 42 mg/dL (ref 0.0–149.0)
VLDL: 8.4 mg/dL (ref 0.0–40.0)

## 2017-10-06 LAB — MICROALBUMIN / CREATININE URINE RATIO
CREATININE, U: 173.1 mg/dL
MICROALB UR: 1.2 mg/dL (ref 0.0–1.9)
MICROALB/CREAT RATIO: 0.7 mg/g (ref 0.0–30.0)

## 2017-10-06 LAB — TSH: TSH: 6.81 u[IU]/mL — ABNORMAL HIGH (ref 0.35–4.50)

## 2017-10-06 LAB — HEMOGLOBIN A1C: Hgb A1c MFr Bld: 6.7 % — ABNORMAL HIGH (ref 4.6–6.5)

## 2017-10-06 MED ORDER — LORAZEPAM 0.5 MG PO TABS: 0.5000 mg | ORAL_TABLET | Freq: Two times a day (BID) | ORAL | 1 refills | Status: DC | PRN

## 2017-10-06 NOTE — Assessment & Plan Note (Signed)
BP well controlled Current regimen effective and well tolerated Continue current medications at current doses Cmp, tsh, cbc 

## 2017-10-06 NOTE — Assessment & Plan Note (Signed)
Check a1c, urine micro Low sugar / carb diet Continue regular exercise  

## 2017-10-06 NOTE — Assessment & Plan Note (Signed)
Sees Dr Ubaldo Glassing multiple times a year

## 2017-10-06 NOTE — Assessment & Plan Note (Signed)
Takes ativan on rare occasion No side effects Will continue

## 2017-10-06 NOTE — Assessment & Plan Note (Signed)
H/o elevated tsh recheck tsh

## 2017-10-06 NOTE — Assessment & Plan Note (Addendum)
Check lipid panel, tsh, cmp  Continue daily statin Regular exercise and healthy diet encouraged  

## 2017-10-09 ENCOUNTER — Encounter: Payer: Self-pay | Admitting: Emergency Medicine

## 2017-11-15 ENCOUNTER — Other Ambulatory Visit: Payer: Self-pay | Admitting: Internal Medicine

## 2017-11-21 ENCOUNTER — Telehealth: Payer: Self-pay | Admitting: Emergency Medicine

## 2017-11-21 NOTE — Telephone Encounter (Signed)
PA completed for Simvastatin- KEY [id number]. Awaiting response.

## 2017-11-21 NOTE — Telephone Encounter (Signed)
PA was denied, please advise on alternative.

## 2017-11-21 NOTE — Telephone Encounter (Signed)
Not sure why - we can change to lipitor 10 mg three times a week  Or 1/2 of a tab (5mg ) daily.- he needs a low dose - if ok with him send in lipitor for 10 mg. --  Let him know this is stronger than simvastatin so he does not need it daily.  The alternative is pravastatin which is weaker - he may need 20 mg daily.    Either one should be good.  We will recheck his at his next appt in April

## 2017-11-23 MED ORDER — PRAVASTATIN SODIUM 20 MG PO TABS: 20.0000 mg | ORAL_TABLET | Freq: Every day | ORAL | 0 refills | Status: DC

## 2017-11-23 NOTE — Telephone Encounter (Signed)
Spoke with pts wife, RX for Pravstatin sent to [facility]. She preferred we sent the weaker medication. Advised her to let pt know to come to appt fasting in April.

## 2018-01-03 NOTE — Patient Instructions (Addendum)
  Test(s) ordered today. Your results will be released to MyChart (or called to you) after review, usually within 72hours after test completion. If any changes need to be made, you will be notified at that same time.   No immunizations administered today.   Medications reviewed and updated.  No changes recommended at this time.   Please followup in 3 months   

## 2018-01-03 NOTE — Progress Notes (Signed)
Subjective:    Patient ID: Brett Franklin, male    DOB: 1926-06-27, 84 y.o.   MRN: [id number]  HPI The patient is here for follow up.  Hypertension: He is taking his medication daily. He is compliant with a low sodium diet.  He denies chest pain, palpitations, edema, shortness of breath and regular headaches. He is exercising regularly - walking.  He does not monitor his blood pressure at home.    Diabetes: He is taking his medication daily as prescribed. He is compliant with a diabetic diet. He is exercising regularly - walking. He checks his feet daily and denies foot lesions. He is up-to-date with an ophthalmology examination.   Hyperlipidemia: He is taking his medication daily. He is compliant with a low fat/cholesterol diet. He is exercising regularly.   Elevated tsh:  His weight has been stable - he is down one pound.  He denies changes in his energy level.    Anxiety:  He has rare situational anxiety.  He takes ativan only as needed, which is not often.  He denies side effects.   Medications and allergies reviewed with patient and updated if appropriate.  Patient Active Problem List   Diagnosis Date Noted  . Right foot pain 01/13/2017  . Elevated TSH 05/03/2015  . Hyperlipidemia 07/21/2011  . OTHER DYSPHAGIA 03/04/2010  . History of stroke 12/31/2009  . LOW BACK PAIN SYNDROME 11/12/2009  . CERVICAL RADICULOPATHY, LEFT 05/11/2008  . NEPHROLITHIASIS, HX OF 05/11/2008  . Diabetes mellitus with neurological manifestations, controlled ([place]) 11/16/2007  . Anxiety 07/08/2007  . Melanoma of skin ([place]) 01/21/2007  . Essential hypertension 01/21/2007  . IBS 01/21/2007  . BPH (benign prostatic hyperplasia) 01/21/2007  . STENOSIS, CERVICAL SPINAL 01/21/2007  . History of adenomatous polyp of colon 01/21/2007    Current Outpatient Medications on File Prior to Visit  Medication Sig Dispense Refill  . amLODipine (NORVASC) 5 MG tablet TAKE 1 TABLET BY MOUTH EVERY DAY 90 tablet 1    . aspirin 325 MG tablet Take 325 mg by mouth daily.      Marland Kitchen JANUMET 50-500 MG tablet TAKE 1 TABLET BY MOUTH DAILY 90 tablet 1  . LORazepam (ATIVAN) 0.5 MG tablet Take 1 tablet (0.5 mg total) by mouth every 12 (twelve) hours as needed for anxiety. 20 tablet 1  . metoprolol tartrate (LOPRESSOR) 25 MG tablet TAKE ONE-HALF TABLET BY MOUTH EVERY 12 HOURS, THEN INCREASE TO 1 TABLET BY MOUTH TWICE DAILY IF BLOOD PRESSURE IS ABOVE 140/90 324 tablet 1  . Multiple Vitamins-Minerals (MACULAR VITAMIN BENEFIT PO) Take by mouth 2 (two) times daily.    . pravastatin (PRAVACHOL) 20 MG tablet Take 1 tablet (20 mg total) by mouth daily. 90 tablet 0  . simvastatin (ZOCOR) 10 MG tablet TAKE 1 TABLET BY MOUTH DAILY AT BEDTIME, EXCEPT 1/2 TABLET ON TUESDAY AND SATURDAY 144 tablet 1  . valACYclovir (VALTREX) 1000 MG tablet TK 2 TS PO BID FOR 1 DAY FOR FEVER BLISTER  3   No current facility-administered medications on file prior to visit.     Past Medical History:  Diagnosis Date  . Adenomatous polyp of colon 11/1996   Dr Fuller Plan  . Anxiety   . BPH (benign prostatic hyperplasia)   . Cervical spinal stenosis   . CVA (cerebral vascular accident) ([place])   . Diabetes mellitus, type 2 ([place])   . GERD (gastroesophageal reflux disease)   . Hemorrhoids   . Hypertension   . IBS (irritable bowel  syndrome)   . Melanoma ([place])   . Nephrolithiasis     X 4    Past Surgical History:  Procedure Laterality Date  . CATARACT EXTRACTION Left   . CHOLECYSTECTOMY    . COLONOSCOPY W/ POLYPECTOMY     Dr.Stark  . CYSTOSCOPY     Dr.Kimbrough     Social History   Socioeconomic History  . Marital status: Married    Spouse name: Not on file  . Number of children: 2  . Years of education: Not on file  . Highest education level: Not on file  Occupational History  . Occupation: retired    Fish farm manager: RETIRED  Social Needs  . Financial resource strain: Not on file  . Food insecurity:    Worry: Not on file    Inability: Not  on file  . Transportation needs:    Medical: Not on file    Non-medical: Not on file  Tobacco Use  . Smoking status: Never Smoker  . Smokeless tobacco: Never Used  Substance and Sexual Activity  . Alcohol use: No  . Drug use: No  . Sexual activity: Not on file  Lifestyle  . Physical activity:    Days per week: Not on file    Minutes per session: Not on file  . Stress: Not on file  Relationships  . Social connections:    Talks on phone: Not on file    Gets together: Not on file    Attends religious service: Not on file    Active member of club or organization: Not on file    Attends meetings of clubs or organizations: Not on file    Relationship status: Not on file  Other Topics Concern  . Not on file  Social History Narrative  . Not on file    Family History  Problem Relation Age of Onset  . Alcohol abuse Father   . Heart disease Father   . Heart attack Maternal Grandfather 65  . Diabetes type I Unknown        grandson  . Cancer Neg Hx   . Stroke Neg Hx     Review of Systems  Constitutional: Negative for chills, fatigue and fever.  Respiratory: Negative for cough, shortness of breath and wheezing.   Cardiovascular: Negative for chest pain, palpitations and leg swelling.  Gastrointestinal: Negative for abdominal pain and nausea.  Neurological: Negative for light-headedness and headaches.       Objective:   Vitals:   01/05/18 0745  BP: 130/72  Pulse: (!) 51  Resp: 16  Temp: (!) 97.5 F (36.4 C)  SpO2: 97%   BP Readings from Last 3 Encounters:  01/05/18 130/72  10/06/17 128/74  06/12/17 (!) 146/82   Wt Readings from Last 3 Encounters:  01/05/18 175 lb (79.4 kg)  10/06/17 176 lb (79.8 kg)  06/12/17 178 lb (80.7 kg)   Body mass index is 25.84 kg/m.   Physical Exam    Constitutional: Appears well-developed and well-nourished. No distress.  HENT:  Head: Normocephalic and atraumatic.  Neck: Neck supple. No tracheal deviation present. No  thyromegaly present.  No cervical lymphadenopathy Cardiovascular: Normal rate, regular rhythm and normal heart sounds.   No murmur heard. No carotid bruit .  No edema Pulmonary/Chest: Effort normal and breath sounds normal. No respiratory distress. No has no wheezes. No rales.  Abdomen: soft, non tender, non distended Skin: Skin is warm and dry. Not diaphoretic.  Psychiatric: Normal mood and affect. Behavior is normal.  Diabetic Foot Exam - Simple   Simple Foot Form Diabetic Foot exam was performed with the following findings:  Yes 01/05/2018  8:08 AM  Visual Inspection No deformities, no ulcerations, no other skin breakdown bilaterally:  Yes Sensation Testing Intact to touch and monofilament testing bilaterally:  Yes Pulse Check Posterior Tibialis and Dorsalis pulse intact bilaterally:  Yes Comments Mild bunion left foot, onychomycosis, hammer toes      Assessment & Plan:    See Problem List for Assessment and Plan of chronic medical problems.

## 2018-01-05 ENCOUNTER — Ambulatory Visit (INDEPENDENT_AMBULATORY_CARE_PROVIDER_SITE_OTHER): Payer: Medicare Other | Admitting: Internal Medicine

## 2018-01-05 ENCOUNTER — Other Ambulatory Visit (INDEPENDENT_AMBULATORY_CARE_PROVIDER_SITE_OTHER): Payer: Medicare Other

## 2018-01-05 ENCOUNTER — Encounter: Payer: Self-pay | Admitting: Internal Medicine

## 2018-01-05 VITALS — BP 130/72 | HR 51 | Temp 97.5°F | Resp 16 | Wt 175.0 lb

## 2018-01-05 DIAGNOSIS — E7849 Other hyperlipidemia: Secondary | ICD-10-CM

## 2018-01-05 DIAGNOSIS — E1149 Type 2 diabetes mellitus with other diabetic neurological complication: Secondary | ICD-10-CM | POA: Diagnosis not present

## 2018-01-05 DIAGNOSIS — R7989 Other specified abnormal findings of blood chemistry: Secondary | ICD-10-CM | POA: Diagnosis not present

## 2018-01-05 DIAGNOSIS — F419 Anxiety disorder, unspecified: Secondary | ICD-10-CM | POA: Diagnosis not present

## 2018-01-05 DIAGNOSIS — I10 Essential (primary) hypertension: Secondary | ICD-10-CM

## 2018-01-05 LAB — COMPREHENSIVE METABOLIC PANEL
ALT: 12 U/L (ref 0–53)
AST: 17 U/L (ref 0–37)
Albumin: 4.2 g/dL (ref 3.5–5.2)
Alkaline Phosphatase: 41 U/L (ref 39–117)
BUN: 28 mg/dL — AB (ref 6–23)
CO2: 28 meq/L (ref 19–32)
CREATININE: 1.2 mg/dL (ref 0.40–1.50)
Calcium: 9.3 mg/dL (ref 8.4–10.5)
Chloride: 102 mEq/L (ref 96–112)
GFR: 60.44 mL/min (ref >60.00)
GLUCOSE: 140 mg/dL — AB (ref 70–99)
Potassium: 4.4 mEq/L (ref 3.5–5.1)
SODIUM: 139 meq/L (ref 135–145)
Total Bilirubin: 0.8 mg/dL (ref 0.2–1.2)
Total Protein: 7.1 g/dL (ref 6.0–8.3)

## 2018-01-05 LAB — T4, FREE: Free T4: 0.87 ng/dL (ref 0.60–1.60)

## 2018-01-05 LAB — LIPID PANEL
CHOL/HDL RATIO: 2
Cholesterol: 93 mg/dL (ref 0–200)
HDL: 56.7 mg/dL (ref >39.00)
LDL Cholesterol: 30 mg/dL (ref 0–99)
NONHDL: 36.6
Triglycerides: 35 mg/dL (ref 0.0–149.0)
VLDL: 7 mg/dL (ref 0.0–40.0)

## 2018-01-05 LAB — HEMOGLOBIN A1C: Hgb A1c MFr Bld: 6.8 % — ABNORMAL HIGH (ref 4.6–6.5)

## 2018-01-05 LAB — TSH: TSH: 5.05 u[IU]/mL — ABNORMAL HIGH (ref 0.35–4.50)

## 2018-01-05 NOTE — Assessment & Plan Note (Signed)
Check lipid panel  Continue daily statin Regular exercise and healthy diet encouraged  

## 2018-01-05 NOTE — Assessment & Plan Note (Signed)
BP well controlled Current regimen effective and well tolerated Continue current medications at current doses cmp  

## 2018-01-05 NOTE — Assessment & Plan Note (Signed)
Lab Results  Component Value Date   HGBA1C 6.7 (H) 10/06/2017   a1c today Continue janument

## 2018-01-05 NOTE — Assessment & Plan Note (Signed)
Situational anxiety Takes ativan only as needed - rarely takes it  No side effects Will continue

## 2018-01-05 NOTE — Assessment & Plan Note (Signed)
Recheck tsh, ft4, thyroid ab Euthyroid on exam

## 2018-01-06 LAB — THYROID ANTIBODIES
THYROID PEROXIDASE ANTIBODY: 1 [IU]/mL (ref <9)
Thyroglobulin Ab: <1 IU/mL (ref <=1)

## 2018-02-17 ENCOUNTER — Other Ambulatory Visit: Payer: Self-pay | Admitting: Internal Medicine

## 2018-02-20 ENCOUNTER — Other Ambulatory Visit: Payer: Self-pay | Admitting: Internal Medicine

## 2018-03-22 ENCOUNTER — Other Ambulatory Visit: Payer: Self-pay | Admitting: Internal Medicine

## 2018-03-25 ENCOUNTER — Telehealth: Payer: Self-pay | Admitting: Internal Medicine

## 2018-03-25 NOTE — Telephone Encounter (Signed)
Copied from [place] [id number]. Topic: Quick Communication - Rx Refill/Question >> Mar 25, 2018 10:03 AM Bea Graff, NT wrote: Medication: pravastatin (PRAVACHOL) 20 MG tablet  Has the patient contacted their pharmacy? Yes.   (Agent: If no, request that the patient contact the pharmacy for the refill.) (Agent: If yes, when and what did the pharmacy advise?) Pharmacy stated to pt they never received the refill request on 02/23/18  Preferred Pharmacy (with phone number or street name):   [facility], [address] [phone] (Phone) [phone] (Fax)      Agent: Please be advised that RX refills may take up to 3 business days. We ask that you follow-up with your pharmacy.

## 2018-03-26 MED ORDER — PRAVASTATIN SODIUM 20 MG PO TABS: ORAL_TABLET | ORAL | 1 refills | Status: DC

## 2018-03-26 NOTE — Telephone Encounter (Signed)
Medication resent to pharmacy due to pharmacy stating refill sent on 02/23/18 was not received.

## 2018-04-04 NOTE — Patient Instructions (Addendum)
   Medications reviewed and updated.  No changes recommended at this time.   Please followup in 3 months    

## 2018-04-04 NOTE — Progress Notes (Signed)
Subjective:    Patient ID: Brett Franklin, male    DOB: Jan 28, 1926, 84 y.o.   MRN: [id number]  HPI The patient is here for follow up.  Diabetes: He is taking his medication daily as prescribed. He is compliant with a diabetic diet. He is exercising regularly - walking. He monitors his sugars and they have been running 130's. He checks his feet daily and denies foot lesions. He is up-to-date with an ophthalmology examination.   Hypertension: He is taking his medication daily. He is compliant with a low sodium diet.  He denies chest pain, palpitations, edema, shortness of breath and regular headaches. He is exercising regularly - walking about /12 a mile.  He does not monitor his blood pressure at home.    Hyperlipidemia: He is taking his medication daily. He is compliant with a low fat/cholesterol diet. He is exercising regularly - walking some. He denies myalgias.   Elevated tsh:  Anxiety: He is taking ativan only as needed for anxiety.  He denies any side effects from the medication. He feels his anxiety is well controlled and he is happy with his current dose of medication.    Medications and allergies reviewed with patient and updated if appropriate.  Patient Active Problem List   Diagnosis Date Noted  . Right foot pain 01/13/2017  . Elevated TSH 05/03/2015  . Hyperlipidemia 07/21/2011  . OTHER DYSPHAGIA 03/04/2010  . History of stroke 12/31/2009  . LOW BACK PAIN SYNDROME 11/12/2009  . CERVICAL RADICULOPATHY, LEFT 05/11/2008  . NEPHROLITHIASIS, HX OF 05/11/2008  . Diabetes mellitus with neurological manifestations, controlled ([place]) 11/16/2007  . Anxiety 07/08/2007  . Melanoma of skin ([place]) 01/21/2007  . Essential hypertension 01/21/2007  . IBS 01/21/2007  . BPH (benign prostatic hyperplasia) 01/21/2007  . STENOSIS, CERVICAL SPINAL 01/21/2007  . History of adenomatous polyp of colon 01/21/2007    Current Outpatient Medications on File Prior to Visit  Medication Sig  Dispense Refill  . amLODipine (NORVASC) 5 MG tablet TAKE 1 TABLET BY MOUTH EVERY DAY 90 tablet 1  . aspirin 325 MG tablet Take 325 mg by mouth daily.      Marland Kitchen JANUMET 50-500 MG tablet TAKE 1 TABLET BY MOUTH DAILY 90 tablet 1  . LORazepam (ATIVAN) 0.5 MG tablet Take 1 tablet (0.5 mg total) by mouth every 12 (twelve) hours as needed for anxiety. 20 tablet 1  . metoprolol tartrate (LOPRESSOR) 25 MG tablet TAKE ONE-HALF TABLET BY MOUTH EVERY 12 HOURS, THEN INCREASE TO 1 TABLET BY MOUTH TWICE DAILY IF BLOOD PRESSURE IS ABOVE 140/90 324 tablet 1  . Multiple Vitamins-Minerals (MACULAR VITAMIN BENEFIT PO) Take by mouth 2 (two) times daily.    . pravastatin (PRAVACHOL) 20 MG tablet TAKE 1 TABLET(20 MG) BY MOUTH DAILY 90 tablet 1  . simvastatin (ZOCOR) 10 MG tablet TAKE 1 TABLET BY MOUTH DAILY AT BEDTIME, EXCEPT 1/2 TABLET ON TUESDAY AND SATURDAY 144 tablet 1  . valACYclovir (VALTREX) 1000 MG tablet TK 2 TS PO BID FOR 1 DAY FOR FEVER BLISTER  3   No current facility-administered medications on file prior to visit.     Past Medical History:  Diagnosis Date  . Adenomatous polyp of colon 11/1996   Dr Fuller Plan  . Anxiety   . BPH (benign prostatic hyperplasia)   . Cervical spinal stenosis   . CVA (cerebral vascular accident) ([place])   . Diabetes mellitus, type 2 ([place])   . GERD (gastroesophageal reflux disease)   . Hemorrhoids   .  Hypertension   . IBS (irritable bowel syndrome)   . Melanoma ([place])   . Nephrolithiasis     X 4    Past Surgical History:  Procedure Laterality Date  . CATARACT EXTRACTION Left   . CHOLECYSTECTOMY    . COLONOSCOPY W/ POLYPECTOMY     Dr.Stark  . CYSTOSCOPY     Dr.Kimbrough     Social History   Socioeconomic History  . Marital status: Married    Spouse name: Not on file  . Number of children: 2  . Years of education: Not on file  . Highest education level: Not on file  Occupational History  . Occupation: retired    Fish farm manager: RETIRED  Social Needs  . Financial  resource strain: Not on file  . Food insecurity:    Worry: Not on file    Inability: Not on file  . Transportation needs:    Medical: Not on file    Non-medical: Not on file  Tobacco Use  . Smoking status: Never Smoker  . Smokeless tobacco: Never Used  Substance and Sexual Activity  . Alcohol use: No  . Drug use: No  . Sexual activity: Not on file  Lifestyle  . Physical activity:    Days per week: Not on file    Minutes per session: Not on file  . Stress: Not on file  Relationships  . Social connections:    Talks on phone: Not on file    Gets together: Not on file    Attends religious service: Not on file    Active member of club or organization: Not on file    Attends meetings of clubs or organizations: Not on file    Relationship status: Not on file  Other Topics Concern  . Not on file  Social History Narrative  . Not on file    Family History  Problem Relation Age of Onset  . Alcohol abuse Father   . Heart disease Father   . Heart attack Maternal Grandfather 65  . Diabetes type I Unknown        grandson  . Cancer Neg Hx   . Stroke Neg Hx     Review of Systems  Constitutional: Negative for appetite change, chills, fatigue and fever.  Eyes: Negative for visual disturbance.  Respiratory: Negative for cough, shortness of breath and wheezing.   Cardiovascular: Negative for chest pain, palpitations and leg swelling.  Gastrointestinal: Negative for abdominal pain and nausea.       No gerd  Neurological: Negative for headaches.       Objective:   Vitals:   04/06/18 0745  BP: 122/66  Pulse: (!) 55  Resp: 16  Temp: (!) 97.5 F (36.4 C)  SpO2: 97%   BP Readings from Last 3 Encounters:  04/06/18 122/66  01/05/18 130/72  10/06/17 128/74   Wt Readings from Last 3 Encounters:  04/06/18 173 lb (78.5 kg)  01/05/18 175 lb (79.4 kg)  10/06/17 176 lb (79.8 kg)   Body mass index is 25.55 kg/m.   Physical Exam    Constitutional: Appears well-developed and  well-nourished. No distress.  HENT:  Head: Normocephalic and atraumatic.  Neck: Neck supple. No tracheal deviation present. No thyromegaly present.  No cervical lymphadenopathy Cardiovascular: Normal rate, regular rhythm and normal heart sounds.   No murmur heard. No carotid bruit .  No edema Pulmonary/Chest: Effort normal and breath sounds normal. No respiratory distress. No has no wheezes. No rales.  Skin: Skin is warm  and dry. Not diaphoretic.  Psychiatric: Normal mood and affect. Behavior is normal.      Assessment & Plan:    See Problem List for Assessment and Plan of chronic medical problems.

## 2018-04-06 ENCOUNTER — Encounter: Payer: Self-pay | Admitting: Internal Medicine

## 2018-04-06 ENCOUNTER — Ambulatory Visit (INDEPENDENT_AMBULATORY_CARE_PROVIDER_SITE_OTHER): Payer: Medicare Other | Admitting: Internal Medicine

## 2018-04-06 VITALS — BP 122/66 | HR 55 | Temp 97.5°F | Resp 16 | Wt 173.0 lb

## 2018-04-06 DIAGNOSIS — R7989 Other specified abnormal findings of blood chemistry: Secondary | ICD-10-CM

## 2018-04-06 DIAGNOSIS — I10 Essential (primary) hypertension: Secondary | ICD-10-CM

## 2018-04-06 DIAGNOSIS — F419 Anxiety disorder, unspecified: Secondary | ICD-10-CM | POA: Diagnosis not present

## 2018-04-06 DIAGNOSIS — E7849 Other hyperlipidemia: Secondary | ICD-10-CM | POA: Diagnosis not present

## 2018-04-06 DIAGNOSIS — E1149 Type 2 diabetes mellitus with other diabetic neurological complication: Secondary | ICD-10-CM

## 2018-04-06 MED ORDER — GLUCOSE BLOOD VI STRP: ORAL_STRIP | 3 refills | Status: DC

## 2018-04-06 NOTE — Assessment & Plan Note (Signed)
Controlled, stable with as needed ativan - he does not take it often  Continue current dose of ativan prn only

## 2018-04-06 NOTE — Assessment & Plan Note (Signed)
Euthyroid on exam Will recheck tsh at next appt in 3 months

## 2018-04-06 NOTE — Assessment & Plan Note (Signed)
BP well controlled Current regimen effective and well tolerated Continue current medications at current doses  

## 2018-04-06 NOTE — Assessment & Plan Note (Signed)
Lab Results  Component Value Date   HGBA1C 6.8 (H) 01/05/2018   Sugars well controlled at home Continue current medication Continue regular exercise Will check a1c in 3 months

## 2018-04-06 NOTE — Assessment & Plan Note (Signed)
Continue daily statin Regular exercise and healthy diet encouraged  

## 2018-04-07 ENCOUNTER — Other Ambulatory Visit: Payer: Self-pay | Admitting: Emergency Medicine

## 2018-04-07 MED ORDER — GLUCOSE BLOOD VI STRP: ORAL_STRIP | 3 refills | Status: DC

## 2018-04-08 ENCOUNTER — Telehealth: Payer: Self-pay | Admitting: Internal Medicine

## 2018-04-08 MED ORDER — BLOOD GLUCOSE MONITOR KIT: PACK | 0 refills | Status: DC

## 2018-04-08 NOTE — Telephone Encounter (Signed)
Spoke with pts wife to inform RX has been sent to [facility] for glucometer and strips.

## 2018-04-08 NOTE — Telephone Encounter (Signed)
Copied from [place] [id number]. Topic: Quick Communication - See Telephone Encounter >> Apr 08, 2018 10:15 AM Bea Graff, NT wrote: CRM for notification. See Telephone encounter for: 04/08/18. Pts wife calling and states the pharmacy stated that pts insurance will not cover the glucose test strips that were called in due to pt needs a new meter. She states the pharmacy suggested either a Freestyle Light or Precision Extra brand meter. She would like to see if the doctor can order the pt a new one. [facility] [place], [place] AT [phone] (Phone) [phone] (Fax)

## 2018-05-15 ENCOUNTER — Other Ambulatory Visit: Payer: Self-pay | Admitting: Internal Medicine

## 2018-05-26 LAB — HM DIABETES EYE EXAM

## 2018-05-28 ENCOUNTER — Encounter: Payer: Self-pay | Admitting: Internal Medicine

## 2018-06-29 ENCOUNTER — Other Ambulatory Visit: Payer: Self-pay | Admitting: Internal Medicine

## 2018-07-08 ENCOUNTER — Encounter: Payer: Self-pay | Admitting: Internal Medicine

## 2018-07-08 ENCOUNTER — Other Ambulatory Visit (INDEPENDENT_AMBULATORY_CARE_PROVIDER_SITE_OTHER): Payer: Medicare Other

## 2018-07-08 ENCOUNTER — Ambulatory Visit (INDEPENDENT_AMBULATORY_CARE_PROVIDER_SITE_OTHER): Payer: Medicare Other | Admitting: Internal Medicine

## 2018-07-08 VITALS — BP 146/74 | HR 47 | Temp 97.6°F | Resp 14 | Ht 69.0 in | Wt 171.0 lb

## 2018-07-08 DIAGNOSIS — Z23 Encounter for immunization: Secondary | ICD-10-CM

## 2018-07-08 DIAGNOSIS — E7849 Other hyperlipidemia: Secondary | ICD-10-CM

## 2018-07-08 DIAGNOSIS — R7989 Other specified abnormal findings of blood chemistry: Secondary | ICD-10-CM

## 2018-07-08 DIAGNOSIS — F419 Anxiety disorder, unspecified: Secondary | ICD-10-CM | POA: Diagnosis not present

## 2018-07-08 DIAGNOSIS — E1149 Type 2 diabetes mellitus with other diabetic neurological complication: Secondary | ICD-10-CM

## 2018-07-08 DIAGNOSIS — I10 Essential (primary) hypertension: Secondary | ICD-10-CM | POA: Diagnosis not present

## 2018-07-08 LAB — CBC WITH DIFFERENTIAL/PLATELET
BASOS PCT: 0.5 % (ref 0.0–3.0)
Basophils Absolute: 0 10*3/uL (ref 0.0–0.1)
EOS ABS: 0.1 10*3/uL (ref 0.0–0.7)
EOS PCT: 1.4 % (ref 0.0–5.0)
HCT: 44.4 % (ref 39.0–52.0)
Hemoglobin: 14.8 g/dL (ref 13.0–17.0)
Lymphocytes Relative: 15 % (ref 12.0–46.0)
Lymphs Abs: 1.3 10*3/uL (ref 0.7–4.0)
MCHC: 33.3 g/dL (ref 30.0–36.0)
MCV: 98.5 fl (ref 78.0–100.0)
MONO ABS: 0.9 10*3/uL (ref 0.1–1.0)
Monocytes Relative: 10.8 % (ref 3.0–12.0)
Neutro Abs: 6.1 10*3/uL (ref 1.4–7.7)
Neutrophils Relative %: 72.3 % (ref 43.0–77.0)
PLATELETS: 189 10*3/uL (ref 150.0–400.0)
RBC: 4.51 Mil/uL (ref 4.22–5.81)
RDW: 14.9 % (ref 11.5–15.5)
WBC: 8.4 10*3/uL (ref 4.0–10.5)

## 2018-07-08 LAB — COMPREHENSIVE METABOLIC PANEL
ALBUMIN: 4.3 g/dL (ref 3.5–5.2)
ALT: 11 U/L (ref 0–53)
AST: 17 U/L (ref 0–37)
Alkaline Phosphatase: 41 U/L (ref 39–117)
BILIRUBIN TOTAL: 0.8 mg/dL (ref 0.2–1.2)
BUN: 18 mg/dL (ref 6–23)
CALCIUM: 9.6 mg/dL (ref 8.4–10.5)
CHLORIDE: 103 meq/L (ref 96–112)
CO2: 28 mEq/L (ref 19–32)
CREATININE: 1.04 mg/dL (ref 0.40–1.50)
GFR: 71.21 mL/min (ref >60.00)
Glucose, Bld: 152 mg/dL — ABNORMAL HIGH (ref 70–99)
Potassium: 5 mEq/L (ref 3.5–5.1)
SODIUM: 139 meq/L (ref 135–145)
Total Protein: 7.3 g/dL (ref 6.0–8.3)

## 2018-07-08 LAB — LIPID PANEL
CHOLESTEROL: 107 mg/dL (ref 0–200)
HDL: 66.4 mg/dL (ref >39.00)
LDL CALC: 33 mg/dL (ref 0–99)
NonHDL: 40.52
TRIGLYCERIDES: 40 mg/dL (ref 0.0–149.0)
Total CHOL/HDL Ratio: 2
VLDL: 8 mg/dL (ref 0.0–40.0)

## 2018-07-08 LAB — TSH: TSH: 3.94 u[IU]/mL (ref 0.35–4.50)

## 2018-07-08 LAB — HEMOGLOBIN A1C: HEMOGLOBIN A1C: 6.5 % (ref 4.6–6.5)

## 2018-07-08 MED ORDER — LORAZEPAM 0.5 MG PO TABS: 0.5000 mg | ORAL_TABLET | Freq: Two times a day (BID) | ORAL | 1 refills | Status: DC | PRN

## 2018-07-08 NOTE — Assessment & Plan Note (Signed)
tsh

## 2018-07-08 NOTE — Assessment & Plan Note (Signed)
Check a1c Low sugar / carb diet Stressed regular exercise   

## 2018-07-08 NOTE — Assessment & Plan Note (Signed)
Check lipid panel  Continue daily statin Regular exercise and healthy diet encouraged  

## 2018-07-08 NOTE — Patient Instructions (Addendum)
  Tests ordered today. Your results will be released to MyChart (or called to you) after review, usually within 72hours after test completion. If any changes need to be made, you will be notified at that same time.  Flu immunization administered today.    Medications reviewed and updated.  Changes include :   none    Please followup in 3 months   

## 2018-07-08 NOTE — Assessment & Plan Note (Signed)
Slightly elevated here today, but overall well controlled Current regimen effective and well tolerated Continue current medications at current doses cmp

## 2018-07-08 NOTE — Assessment & Plan Note (Signed)
Controlled, stable Continue current dose of medication  

## 2018-07-08 NOTE — Progress Notes (Signed)
Subjective:    Patient ID: Brett Franklin, male    DOB: 05-24-1926, 84 y.o.   MRN: [id number]  HPI The patient is here for follow up.  Diabetes: He is taking his medication daily as prescribed. He is compliant with a diabetic diet. He is exercising regularly - walking some.  He checks his feet daily and denies foot lesions. He is up-to-date with an ophthalmology examination.   Hypertension: He is taking his medication daily. He is compliant with a low sodium diet.  He denies chest pain, palpitations, edema, shortness of breath and regular headaches. He is exercising regularly.  He does not monitor his blood pressure at home.    Hyperlipidemia: He is taking his medication daily. He is compliant with a low fat/cholesterol diet. He is exercising regularly. He denies myalgias.   Anxiety: He is taking ativan prn only. He denies any side effects from the medication. He feels his anxiety is well controlled and he is happy with his current dose of medication.   Elevated tsh:  He denies fatigue.  He has never been on medication.    Medications and allergies reviewed with patient and updated if appropriate.  Patient Active Problem List   Diagnosis Date Noted  . Right foot pain 01/13/2017  . Elevated TSH 05/03/2015  . Hyperlipidemia 07/21/2011  . OTHER DYSPHAGIA 03/04/2010  . History of stroke 12/31/2009  . LOW BACK PAIN SYNDROME 11/12/2009  . CERVICAL RADICULOPATHY, LEFT 05/11/2008  . NEPHROLITHIASIS, HX OF 05/11/2008  . Diabetes mellitus with neurological manifestations, controlled ([place]) 11/16/2007  . Anxiety 07/08/2007  . Melanoma of skin ([place]) 01/21/2007  . Essential hypertension 01/21/2007  . IBS 01/21/2007  . BPH (benign prostatic hyperplasia) 01/21/2007  . STENOSIS, CERVICAL SPINAL 01/21/2007  . History of adenomatous polyp of colon 01/21/2007    Current Outpatient Medications on File Prior to Visit  Medication Sig Dispense Refill  . amLODipine (NORVASC) 5 MG tablet TAKE 1  TABLET BY MOUTH EVERY DAY 90 tablet 1  . aspirin 325 MG tablet Take 325 mg by mouth daily.      . blood glucose meter kit and supplies KIT Dispense based on patient and insurance preference. Use to text blood sugars daily. 1 each 0  . glucose blood test strip Use to test blood sugars once daily 100 each 3  . JANUMET 50-500 MG tablet TAKE 1 TABLET BY MOUTH DAILY 90 tablet 1  . LORazepam (ATIVAN) 0.5 MG tablet Take 1 tablet (0.5 mg total) by mouth every 12 (twelve) hours as needed for anxiety. 20 tablet 1  . metoprolol tartrate (LOPRESSOR) 25 MG tablet TAKE 0.5 TABLET BY MOUTH EVERY 12 HOURS, THEN TAKE 1 TABLET BY MOUTH TWICE DAILY IF BLOOD PRESSURE IS ABOVE 140/90 324 tablet 0  . Multiple Vitamins-Minerals (MACULAR VITAMIN BENEFIT PO) Take by mouth 2 (two) times daily.    Marland Kitchen neomycin-polymyxin b-dexamethasone (MAXITROL) 3.5-10000-0.1 OINT APPLY IN BOTH EYES AT BEDTIME UTD . USE LESS THAN 3 TIMES WEEKLY  4  . pravastatin (PRAVACHOL) 20 MG tablet TAKE 1 TABLET(20 MG) BY MOUTH DAILY 90 tablet 1  . valACYclovir (VALTREX) 1000 MG tablet TK 2 TS PO BID FOR 1 DAY FOR FEVER BLISTER  3   No current facility-administered medications on file prior to visit.     Past Medical History:  Diagnosis Date  . Adenomatous polyp of colon 11/1996   Dr Fuller Plan  . Anxiety   . BPH (benign prostatic hyperplasia)   . Cervical spinal  stenosis   . CVA (cerebral vascular accident) ([place])   . Diabetes mellitus, type 2 ([place])   . GERD (gastroesophageal reflux disease)   . Hemorrhoids   . Hypertension   . IBS (irritable bowel syndrome)   . Melanoma ([place])   . Nephrolithiasis     X 4    Past Surgical History:  Procedure Laterality Date  . CATARACT EXTRACTION Left   . CHOLECYSTECTOMY    . COLONOSCOPY W/ POLYPECTOMY     Dr.Stark  . CYSTOSCOPY     Dr.Kimbrough     Social History   Socioeconomic History  . Marital status: Married    Spouse name: Not on file  . Number of children: 2  . Years of education: Not on  file  . Highest education level: Not on file  Occupational History  . Occupation: retired    Fish farm manager: RETIRED  Social Needs  . Financial resource strain: Not on file  . Food insecurity:    Worry: Not on file    Inability: Not on file  . Transportation needs:    Medical: Not on file    Non-medical: Not on file  Tobacco Use  . Smoking status: Never Smoker  . Smokeless tobacco: Never Used  Substance and Sexual Activity  . Alcohol use: No  . Drug use: No  . Sexual activity: Not on file  Lifestyle  . Physical activity:    Days per week: Not on file    Minutes per session: Not on file  . Stress: Not on file  Relationships  . Social connections:    Talks on phone: Not on file    Gets together: Not on file    Attends religious service: Not on file    Active member of club or organization: Not on file    Attends meetings of clubs or organizations: Not on file    Relationship status: Not on file  Other Topics Concern  . Not on file  Social History Narrative  . Not on file    Family History  Problem Relation Age of Onset  . Alcohol abuse Father   . Heart disease Father   . Heart attack Maternal Grandfather 65  . Diabetes type I Unknown        grandson  . Cancer Neg Hx   . Stroke Neg Hx     Review of Systems  Constitutional: Negative for activity change, chills, fatigue and fever.  Respiratory: Negative for cough, shortness of breath and wheezing.   Cardiovascular: Negative for chest pain, palpitations and leg swelling.  Musculoskeletal: Positive for gait problem (balance foot - no major changes).  Neurological: Negative for dizziness, light-headedness and headaches.       Objective:   Vitals:   07/08/18 0822  BP: (!) 146/74  Pulse: (!) 47  Resp: 14  Temp: 97.6 F (36.4 C)  SpO2: 98%   BP Readings from Last 3 Encounters:  07/08/18 (!) 146/74  04/06/18 122/66  01/05/18 130/72   Wt Readings from Last 3 Encounters:  07/08/18 171 lb (77.6 kg)  04/06/18  173 lb (78.5 kg)  01/05/18 175 lb (79.4 kg)   Body mass index is 25.25 kg/m.   Physical Exam    Constitutional: Appears well-developed and well-nourished. No distress.  HENT:  Head: Normocephalic and atraumatic.  Neck: Neck supple. No tracheal deviation present. No thyromegaly present.  No cervical lymphadenopathy Cardiovascular: Normal rate, regular rhythm and normal heart sounds.   No murmur heard. No carotid bruit .  No edema Pulmonary/Chest: Effort normal and breath sounds normal. No respiratory distress. No has no wheezes. No rales.  Skin: Skin is warm and dry. Not diaphoretic.  Psychiatric: Normal mood and affect. Behavior is normal.      Assessment & Plan:    See Problem List for Assessment and Plan of chronic medical problems.

## 2018-08-15 ENCOUNTER — Other Ambulatory Visit: Payer: Self-pay | Admitting: Internal Medicine

## 2018-09-26 ENCOUNTER — Other Ambulatory Visit: Payer: Self-pay | Admitting: Internal Medicine

## 2018-10-05 LAB — HM DIABETES FOOT EXAM

## 2018-10-07 NOTE — Progress Notes (Signed)
Subjective:    Patient ID: Brett Franklin, male    DOB: 06/24/1926, 85 y.o.   MRN: [id number]  HPI The patient is here for follow up.  He woke up 10 days ago and his ears were stopped up.  He went to his ear doctor. He had a little wax.  It did not help.  He was still having the clogged sensation and had a little cold.  He finally thinks this is clearing.    Diabetes: He is taking his medication daily as prescribed. He is compliant with a diabetic diet. He is exercising regularly - rides stationary bike for 30 minutes. His sugars have averaged 121 over th past 4 days.  He checks his feet daily and denies foot lesions. He is up-to-date with an ophthalmology examination.   Hypertension: He is taking his medication daily. He is compliant with a low sodium diet.  He denies chest pain, palpitations, edema, shortness of breath and regular headaches. He is exercising regularly.  He does not monitor his blood pressure at home.    Hyperlipidemia: He is taking his medication daily. He is compliant with a low fat/cholesterol diet. He is exercising regularly. He denies myalgias.   Anxiety: He is taking his medication daily as prescribed. He denies any side effects from the medication. He feels his anxiety is well controlled and he is happy with his current dose of medication.   Elevated tsh: He is had an elevated TSH in the past.  Antibodies were negative.  He denies any change in energy level or weight that does not make sense.  He is not currently on medication.  Poor balance: He does have poor balance.  He denies any falls since he was here last.  He rides a stationary bike, but does not walk much.  He has balance exercises at home.  Medications and allergies reviewed with patient and updated if appropriate.  Patient Active Problem List   Diagnosis Date Noted  . Poor balance 10/08/2018  . Right foot pain 01/13/2017  . Elevated TSH 05/03/2015  . Hyperlipidemia 07/21/2011  . OTHER DYSPHAGIA  03/04/2010  . History of stroke 12/31/2009  . LOW BACK PAIN SYNDROME 11/12/2009  . CERVICAL RADICULOPATHY, LEFT 05/11/2008  . NEPHROLITHIASIS, HX OF 05/11/2008  . Diabetes mellitus with neurological manifestations, controlled ([place]) 11/16/2007  . Anxiety 07/08/2007  . Melanoma of skin ([place]) 01/21/2007  . Essential hypertension 01/21/2007  . IBS 01/21/2007  . BPH (benign prostatic hyperplasia) 01/21/2007  . STENOSIS, CERVICAL SPINAL 01/21/2007  . History of adenomatous polyp of colon 01/21/2007    Current Outpatient Medications on File Prior to Visit  Medication Sig Dispense Refill  . amLODipine (NORVASC) 5 MG tablet TAKE 1 TABLET BY MOUTH EVERY DAY 90 tablet 0  . aspirin 325 MG tablet Take 325 mg by mouth daily.      . blood glucose meter kit and supplies KIT Dispense based on patient and insurance preference. Use to text blood sugars daily. 1 each 0  . glucose blood test strip Use to test blood sugars once daily 100 each 3  . JANUMET 50-500 MG tablet TAKE 1 TABLET BY MOUTH DAILY 90 tablet 1  . LORazepam (ATIVAN) 0.5 MG tablet Take 1 tablet (0.5 mg total) by mouth every 12 (twelve) hours as needed for anxiety. 20 tablet 1  . metoprolol tartrate (LOPRESSOR) 25 MG tablet TAKE 0.5 TABLET BY MOUTH EVERY 12 HOURS, THEN TAKE 1 TABLET BY MOUTH TWICE DAILY IF BLOOD  PRESSURE IS ABOVE 140/90 324 tablet 0  . Multiple Vitamins-Minerals (MACULAR VITAMIN BENEFIT PO) Take by mouth 2 (two) times daily.    Marland Kitchen neomycin-polymyxin b-dexamethasone (MAXITROL) 3.5-10000-0.1 OINT APPLY IN BOTH EYES AT BEDTIME UTD . USE LESS THAN 3 TIMES WEEKLY  4  . pravastatin (PRAVACHOL) 20 MG tablet TAKE 1 TABLET(20 MG) BY MOUTH DAILY 90 tablet 1  . valACYclovir (VALTREX) 1000 MG tablet TK 2 TS PO BID FOR 1 DAY FOR FEVER BLISTER  3   No current facility-administered medications on file prior to visit.     Past Medical History:  Diagnosis Date  . Adenomatous polyp of colon 11/1996   Dr Fuller Plan  . Anxiety   . BPH (benign  prostatic hyperplasia)   . Cervical spinal stenosis   . CVA (cerebral vascular accident) ([place])   . Diabetes mellitus, type 2 ([place])   . GERD (gastroesophageal reflux disease)   . Hemorrhoids   . Hypertension   . IBS (irritable bowel syndrome)   . Melanoma ([place])   . Nephrolithiasis     X 4    Past Surgical History:  Procedure Laterality Date  . CATARACT EXTRACTION Left   . CHOLECYSTECTOMY    . COLONOSCOPY W/ POLYPECTOMY     Dr.Stark  . CYSTOSCOPY     Dr.Kimbrough     Social History   Socioeconomic History  . Marital status: Married    Spouse name: Not on file  . Number of children: 2  . Years of education: Not on file  . Highest education level: Not on file  Occupational History  . Occupation: retired    Fish farm manager: RETIRED  Social Needs  . Financial resource strain: Not on file  . Food insecurity:    Worry: Not on file    Inability: Not on file  . Transportation needs:    Medical: Not on file    Non-medical: Not on file  Tobacco Use  . Smoking status: Never Smoker  . Smokeless tobacco: Never Used  Substance and Sexual Activity  . Alcohol use: No  . Drug use: No  . Sexual activity: Not on file  Lifestyle  . Physical activity:    Days per week: Not on file    Minutes per session: Not on file  . Stress: Not on file  Relationships  . Social connections:    Talks on phone: Not on file    Gets together: Not on file    Attends religious service: Not on file    Active member of club or organization: Not on file    Attends meetings of clubs or organizations: Not on file    Relationship status: Not on file  Other Topics Concern  . Not on file  Social History Narrative  . Not on file    Family History  Problem Relation Age of Onset  . Alcohol abuse Father   . Heart disease Father   . Heart attack Maternal Grandfather 65  . Diabetes type I Unknown        grandson  . Cancer Neg Hx   . Stroke Neg Hx     Review of Systems  Constitutional: Negative for  chills, fatigue and fever.  Respiratory: Negative for cough, shortness of breath and wheezing.   Cardiovascular: Negative for chest pain, palpitations and leg swelling.  Musculoskeletal: Positive for gait problem.  Neurological: Negative for dizziness, light-headedness and headaches.       Objective:   Vitals:   10/08/18 0747  BP:  134/60  Pulse: (!) 55  Temp: (!) 97.5 F (36.4 C)  SpO2: 98%   BP Readings from Last 3 Encounters:  10/08/18 134/60  07/08/18 (!) 146/74  04/06/18 122/66   Wt Readings from Last 3 Encounters:  10/08/18 171 lb (77.6 kg)  07/08/18 171 lb (77.6 kg)  04/06/18 173 lb (78.5 kg)   Body mass index is 25.25 kg/m.   Physical Exam    Constitutional: Appears well-developed and well-nourished. No distress.  HENT:  Head: Normocephalic and atraumatic.  Neck: b/l ear canals and TM wnl, Neck supple. No tracheal deviation present. No thyromegaly present.  No cervical lymphadenopathy Cardiovascular: Normal rate, regular rhythm and normal heart sounds.   No murmur heard. No carotid bruit .  No edema Pulmonary/Chest: Effort normal and breath sounds normal. No respiratory distress. No has no wheezes. No rales.  Skin: Skin is warm and dry. Not diaphoretic.  Psychiatric: Normal mood and affect. Behavior is normal.      Assessment & Plan:   Will hold off on blood work today and repeat in 3 months   See Problem List for Assessment and Plan of chronic medical problems.    FU in 3 months

## 2018-10-07 NOTE — Patient Instructions (Addendum)
   Medications reviewed and updated.  Changes include :   none   A referral was ordered for physical therapy  Please followup in 3 months

## 2018-10-07 NOTE — Progress Notes (Addendum)
Subjective:   Brett Franklin is a 85 y.o. male who presents for Medicare Annual/Subsequent preventive examination.  Review of Systems:  No ROS.  Medicare Wellness Visit. Additional risk factors are reflected in the social history. Cardiac Risk Factors include: advanced age (>43mn, >>66women);diabetes mellitus;dyslipidemia;hypertension;male gender Sleep patterns: feels rested on waking, gets up 2 times nightly to void and sleeps 6-7 hours nightly.    Home Safety/Smoke Alarms: Feels safe in home. Smoke alarms in place.  Living environment; residence and Firearm Safety: 2-story house, no firearms. Lives with wife, no needs for DME, good support system Seat Belt Safety/Bike Helmet: Wears seat belt.      Objective:    Vitals: BP 134/60   Pulse (!) 55   Resp 17   Ht _0  (1.778 m)   Wt 171 lb (77.6 kg)   SpO2 98%   BMI 24.54 kg/m   Body mass index is 24.54 kg/m.  Advanced Directives 10/08/2018  Does Patient Have a Medical Advance Directive? Yes  Type of [facility] will  Copy of [place] Chart? No - copy requested    Tobacco Social History   Tobacco Use  Smoking Status Never Smoker  Smokeless Tobacco Never Used     Counseling given: Not Answered  Past Medical History:  Diagnosis Date  . Adenomatous polyp of colon 11/1996   Dr SFuller Plan . Anxiety   . BPH (benign prostatic hyperplasia)   . Cervical spinal stenosis   . [place] (cerebral vascular accident) ([place]   . Diabetes mellitus, type 2 ([place]   . GERD (gastroesophageal reflux disease)   . Hemorrhoids   . Hypertension   . IBS (irritable bowel syndrome)   . Melanoma ([place]   . Nephrolithiasis     X 4   Past Surgical History:  Procedure Laterality Date  . CATARACT EXTRACTION Left   . CHOLECYSTECTOMY    . COLONOSCOPY W/ POLYPECTOMY     Dr.Stark  . CYSTOSCOPY     Dr.Kimbrough    Family History  Problem Relation Age of Onset  . Alcohol abuse  Father   . Heart disease Father   . Heart attack Maternal Grandfather 65  . Diabetes type I Other        grandson  . Cancer Neg Hx   . Stroke Neg Hx    Social History   Socioeconomic History  . Marital status: Married    Spouse name: Not on file  . Number of children: 2  . Years of education: Not on file  . Highest education level: Not on file  Occupational History  . Occupation: retired    EFish farm manager RETIRED  Social Needs  . Financial resource strain: Not hard at all  . Food insecurity:    Worry: Never true    Inability: Never true  . Transportation needs:    Medical: No    Non-medical: No  Tobacco Use  . Smoking status: Never Smoker  . Smokeless tobacco: Never Used  Substance and Sexual Activity  . Alcohol use: No  . Drug use: No  . Sexual activity: Not Currently  Lifestyle  . Physical activity:    Days per week: 6 days    Minutes per session: 30 min  . Stress: Not at all  Relationships  . Social connections:    Talks on phone: More than three times a week    Gets together: More than three times a week  Attends religious service: More than 4 times per year    Active member of club or organization: Yes    Attends meetings of clubs or organizations: More than 4 times per year    Relationship status: Married  Other Topics Concern  . Not on file  Social History Narrative  . Not on file    Outpatient Encounter Medications as of 10/08/2018  Medication Sig  . amLODipine (NORVASC) 5 MG tablet TAKE 1 TABLET BY MOUTH EVERY DAY  . aspirin 325 MG tablet Take 325 mg by mouth daily.    . blood glucose meter kit and supplies KIT Dispense based on patient and insurance preference. Use to text blood sugars daily.  Marland Kitchen glucose blood test strip Use to test blood sugars once daily  . JANUMET 50-500 MG tablet TAKE 1 TABLET BY MOUTH DAILY  . LORazepam (ATIVAN) 0.5 MG tablet Take 1 tablet (0.5 mg total) by mouth every 12 (twelve) hours as needed for anxiety.  . metoprolol  tartrate (LOPRESSOR) 25 MG tablet TAKE 0.5 TABLET BY MOUTH EVERY 12 HOURS, THEN TAKE 1 TABLET BY MOUTH TWICE DAILY IF BLOOD PRESSURE IS ABOVE 140/90  . Multiple Vitamins-Minerals (MACULAR VITAMIN BENEFIT PO) Take by mouth 2 (two) times daily.  Marland Kitchen neomycin-polymyxin b-dexamethasone (MAXITROL) 3.5-10000-0.1 OINT APPLY IN BOTH EYES AT BEDTIME UTD . USE LESS THAN 3 TIMES WEEKLY  . pravastatin (PRAVACHOL) 20 MG tablet TAKE 1 TABLET(20 MG) BY MOUTH DAILY  . valACYclovir (VALTREX) 1000 MG tablet TK 2 TS PO BID FOR 1 DAY FOR FEVER BLISTER   No facility-administered encounter medications on file as of 10/08/2018.     Activities of Daily Living In your present state of health, do you have any difficulty performing the following activities: 10/08/2018  Hearing? N  Vision? N  Difficulty concentrating or making decisions? N  Walking or climbing stairs? N  Dressing or bathing? N  Doing errands, shopping? N  Preparing Food and eating ? N  Using the Toilet? N  In the past six months, have you accidently leaked urine? N  Do you have problems with loss of bowel control? N  Managing your Medications? N  Managing your Finances? N  Housekeeping or managing your Housekeeping? N  Some recent data might be hidden    Patient Care Team: Binnie Rail, MD as PCP - General (Internal Medicine)   Assessment:   This is a routine wellness examination for [facility]. Physical assessment deferred to PCP.  Exercise Activities and Dietary recommendations Current Exercise Habits: Home exercise routine, Type of exercise: walking(stationary bike), Time (Minutes): 30, Frequency (Times/Week): 6, Weekly Exercise (Minutes/Week): 180, Intensity: Mild, Exercise limited by: orthopedic condition(s)  Diet (meal preparation, eat out, water intake, caffeinated beverages, dairy products, fruits and vegetables): in general, a "healthy" diet  , well balanced   Reviewed heart healthy diet. Encouraged patient to increase daily water and  healthy fluid intake.  Goals    . Patient Stated     Maintain current health status and stay as independent as possible.       Fall Risk Fall Risk  10/08/2018 10/06/2017 09/03/2016 10/12/2014 07/06/2013  Falls in the past year? 0 No No No No  Risk for fall due to : Impaired balance/gait - - - -  Risk for fall due to: Comment referral for PT placed today by PCP - - - -  Follow up Falls prevention discussed - - - -    Depression Screen [facility] 2/9 Scores 10/08/2018 10/06/2017 09/03/2016  10/12/2014  PHQ - 2 Score 0 0 0 0    Cognitive Function MMSE - Mini Mental State Exam 10/08/2018  Orientation to time 5  Orientation to Place 5  Registration 3  Attention/ Calculation 5  Recall 2  Language- name 2 objects 2  Language- repeat 1  Language- follow 3 step command 3  Language- read & follow direction 1  Write a sentence 1  Copy design 1  Total score 29        Immunization History  Administered Date(s) Administered  . Influenza Split 07/21/2011, 07/08/2012  . Influenza Whole 08/03/2007, 08/05/2010  . Influenza, High Dose Seasonal PF 07/06/2013, 07/22/2016, 06/12/2017, 07/08/2018  . Influenza,inj,Quad PF,6+ Mos 07/18/2014, 08/10/2015  . Pneumococcal Conjugate-13 10/12/2014  . Pneumococcal Polysaccharide-23 09/29/1997  . Td 09/01/1996  . Tdap 05/03/2015  . Zoster 04/16/2009  . Zoster Recombinat (Shingrix) 04/05/2018, 08/13/2018   Screening Tests Health Maintenance  Topic Date Due  . URINE MICROALBUMIN  10/06/2018  . FOOT EXAM  01/06/2019  . HEMOGLOBIN A1C  01/07/2019  . OPHTHALMOLOGY EXAM  05/27/2019  . TETANUS/TDAP  05/02/2025  . INFLUENZA VACCINE  Completed  . PNA vac Low Risk Adult  Completed      Plan:     Continue doing brain stimulating activities (puzzles, reading, adult coloring books, staying active) to keep memory sharp.   Continue to eat heart healthy diet (full of fruits, vegetables, whole grains, lean protein, water--limit salt, fat, and sugar intake) and  increase physical activity as tolerated.  I have personally reviewed and noted the following in the patient's chart:   . Medical and social history . Use of alcohol, tobacco or illicit drugs  . Current medications and supplements . Functional ability and status . Nutritional status . Physical activity . Advanced directives . List of other physicians . Vitals . Screenings to include cognitive, depression, and falls . Referrals and appointments  In addition, I have reviewed and discussed with patient certain preventive protocols, quality metrics, and best practice recommendations. A written personalized care plan for preventive services as well as general preventive health recommendations were provided to patient.     Michiel Cowboy, RN  10/08/2018   Medical screening examination/treatment/procedure(s) were performed by non-physician practitioner and as supervising physician I was immediately available for consultation/collaboration. I agree with above. Binnie Rail, MD

## 2018-10-08 ENCOUNTER — Ambulatory Visit (INDEPENDENT_AMBULATORY_CARE_PROVIDER_SITE_OTHER): Payer: Medicare Other | Admitting: Internal Medicine

## 2018-10-08 ENCOUNTER — Encounter: Payer: Self-pay | Admitting: Internal Medicine

## 2018-10-08 ENCOUNTER — Ambulatory Visit (INDEPENDENT_AMBULATORY_CARE_PROVIDER_SITE_OTHER): Payer: Medicare Other | Admitting: *Deleted

## 2018-10-08 VITALS — BP 134/60 | HR 55 | Temp 97.5°F | Wt 171.0 lb

## 2018-10-08 VITALS — BP 134/60 | HR 55 | Resp 17 | Ht 70.0 in | Wt 171.0 lb

## 2018-10-08 DIAGNOSIS — E7849 Other hyperlipidemia: Secondary | ICD-10-CM | POA: Diagnosis not present

## 2018-10-08 DIAGNOSIS — R7989 Other specified abnormal findings of blood chemistry: Secondary | ICD-10-CM

## 2018-10-08 DIAGNOSIS — I10 Essential (primary) hypertension: Secondary | ICD-10-CM | POA: Diagnosis not present

## 2018-10-08 DIAGNOSIS — E1149 Type 2 diabetes mellitus with other diabetic neurological complication: Secondary | ICD-10-CM

## 2018-10-08 DIAGNOSIS — Z0000 Encounter for general adult medical examination without abnormal findings: Secondary | ICD-10-CM

## 2018-10-08 DIAGNOSIS — F419 Anxiety disorder, unspecified: Secondary | ICD-10-CM

## 2018-10-08 DIAGNOSIS — R2689 Other abnormalities of gait and mobility: Secondary | ICD-10-CM

## 2018-10-08 NOTE — Assessment & Plan Note (Signed)
Takes ativan prn only, not often Controlled, stable Continue current dose of medication

## 2018-10-08 NOTE — Assessment & Plan Note (Signed)
Poor balance Rides stationary bike Would benefit from PT - referred

## 2018-10-08 NOTE — Assessment & Plan Note (Addendum)
BP well controlled Current regimen effective and well tolerated Continue current medications at current doses  

## 2018-10-08 NOTE — Assessment & Plan Note (Addendum)
Sugars have been controlled Continue current medication Continue to be active and diabetic diet

## 2018-10-08 NOTE — Assessment & Plan Note (Addendum)
lipid panel well controlled Continue daily statin Regular exercise and healthy diet encouraged

## 2018-10-08 NOTE — Patient Instructions (Addendum)
Continue doing brain stimulating activities (puzzles, reading, adult coloring books, staying active) to keep memory sharp.   Continue to eat heart healthy diet (full of fruits, vegetables, whole grains, lean protein, water--limit salt, fat, and sugar intake) and increase physical activity as tolerated.   Brett Franklin , Thank you for taking time to come for your Medicare Wellness Visit. I appreciate your ongoing commitment to your health goals. Please review the following plan we discussed and let me know if I can assist you in the future.   These are the goals we discussed: Goals    . Patient Stated     Maintain current health status and stay as independent as possible.       This is a list of the screening recommended for you and due dates:  Health Maintenance  Topic Date Due  . Urine Protein Check  10/06/2018  . Complete foot exam   01/06/2019  . Hemoglobin A1C  01/07/2019  . Eye exam for diabetics  05/27/2019  . Tetanus Vaccine  05/02/2025  . Flu Shot  Completed  . Pneumonia vaccines  Completed     Health Maintenance, Male A healthy lifestyle and preventive care is important for your health and wellness. Ask your health care provider about what schedule of regular examinations is right for you. What should I know about weight and diet? Eat a Healthy Diet  Eat plenty of vegetables, fruits, whole grains, low-fat dairy products, and lean protein.  Do not eat a lot of foods high in solid fats, added sugars, or salt.  Maintain a Healthy Weight Regular exercise can help you achieve or maintain a healthy weight. You should:  Do at least 150 minutes of exercise each week. The exercise should increase your heart rate and make you sweat (moderate-intensity exercise).  Do strength-training exercises at least twice a week. Watch Your Levels of Cholesterol and Blood Lipids  Have your blood tested for lipids and cholesterol every 5 years starting at 83 years of age. If you are at high  risk for heart disease, you should start having your blood tested when you are 83 years old. You may need to have your cholesterol levels checked more often if: ? Your lipid or cholesterol levels are high. ? You are older than 83 years of age. ? You are at high risk for heart disease. What should I know about cancer screening? Many types of cancers can be detected early and may often be prevented. Lung Cancer  You should be screened every year for lung cancer if: ? You are a current smoker who has smoked for at least 30 years. ? You are a former smoker who has quit within the past 15 years.  Talk to your health care provider about your screening options, when you should start screening, and how often you should be screened. Colorectal Cancer  Routine colorectal cancer screening usually begins at 83 years of age and should be repeated every 5-10 years until you are 83 years old. You may need to be screened more often if early forms of precancerous polyps or small growths are found. Your health care provider may recommend screening at an earlier age if you have risk factors for colon cancer.  Your health care provider may recommend using home test kits to check for hidden blood in the stool.  A small camera at the end of a tube can be used to examine your colon (sigmoidoscopy or colonoscopy). This checks for the earliest forms of  colorectal cancer. Prostate and Testicular Cancer  Depending on your age and overall health, your health care provider may do certain tests to screen for prostate and testicular cancer.  Talk to your health care provider about any symptoms or concerns you have about testicular or prostate cancer. Skin Cancer  Check your skin from head to toe regularly.  Tell your health care provider about any new moles or changes in moles, especially if: ? There is a change in a mole's size, shape, or color. ? You have a mole that is larger than a pencil eraser.  Always use  sunscreen. Apply sunscreen liberally and repeat throughout the day.  Protect yourself by wearing long sleeves, pants, a wide-brimmed hat, and sunglasses when outside. What should I know about heart disease, diabetes, and high blood pressure?  If you are 32-54 years of age, have your blood pressure checked every 3-5 years. If you are 53 years of age or older, have your blood pressure checked every year. You should have your blood pressure measured twice-once when you are at a hospital or clinic, and once when you are not at a hospital or clinic. Record the average of the two measurements. To check your blood pressure when you are not at a hospital or clinic, you can use: ? An automated blood pressure machine at a pharmacy. ? A home blood pressure monitor.  Talk to your health care provider about your target blood pressure.  If you are between 41-93 years old, ask your health care provider if you should take aspirin to prevent heart disease.  Have regular diabetes screenings by checking your fasting blood sugar level. ? If you are at a normal weight and have a low risk for diabetes, have this test once every three years after the age of 81. ? If you are overweight and have a high risk for diabetes, consider being tested at a younger age or more often.  A one-time screening for abdominal aortic aneurysm (AAA) by ultrasound is recommended for men aged 3-75 years who are current or former smokers. What should I know about preventing infection? Hepatitis B If you have a higher risk for hepatitis B, you should be screened for this virus. Talk with your health care provider to find out if you are at risk for hepatitis B infection. Hepatitis C Blood testing is recommended for:  Everyone born from [birth year] through 1963.  Anyone with known risk factors for hepatitis C. Sexually Transmitted Diseases (STDs)  You should be screened each year for STDs including gonorrhea and chlamydia if: ? You are  sexually active and are younger than 83 years of age. ? You are older than 83 years of age and your health care provider tells you that you are at risk for this type of infection. ? Your sexual activity has changed since you were last screened and you are at an increased risk for chlamydia or gonorrhea. Ask your health care provider if you are at risk.  Talk with your health care provider about whether you are at high risk of being infected with HIV. Your health care provider may recommend a prescription medicine to help prevent HIV infection. What else can I do?  Schedule regular health, dental, and eye exams.  Stay current with your vaccines (immunizations).  Do not use any tobacco products, such as cigarettes, chewing tobacco, and e-cigarettes. If you need help quitting, ask your health care provider.  Limit alcohol intake to no more than 2 drinks per  day. One drink equals 12 ounces of beer, 5 ounces of Landri Dors[place]vil, or 1 ounces of hard liquor.  Do not use street drugs.  Do not share needles.  Ask your health care provider for help if you need support or information about quitting drugs.  Tell your health care provider if you often feel depressed.  Tell your health care provider if you have ever been abused or do not feel safe at home. This information is not intended to replace advice given to you by your health care provider. Make sure you discuss any questions you have with your health care provider. Document Released: 03/13/2008 Document Revised: 05/14/2016 Document Reviewed: 06/19/2015 Elsevier Interactive Patient Education  2019 [employer].

## 2018-10-08 NOTE — Assessment & Plan Note (Addendum)
tsh elevated in past, but last visit it was normal Recheck tsh at next visit in 3 months Asymptomatic

## 2018-10-26 ENCOUNTER — Telehealth: Payer: Self-pay

## 2018-10-26 DIAGNOSIS — G8929 Other chronic pain: Secondary | ICD-10-CM

## 2018-10-26 DIAGNOSIS — R2689 Other abnormalities of gait and mobility: Secondary | ICD-10-CM

## 2018-10-26 DIAGNOSIS — M545 Low back pain: Secondary | ICD-10-CM

## 2018-10-26 NOTE — Telephone Encounter (Signed)
Copied from [place] [phone]. Topic: Referral - Request for Referral >> Oct 26, 2018 11:01 AM Alanda Slim E wrote: Has patient seen PCP for this complaint? Yes  *If NO, is insurance requiring patient see PCP for this issue before PCP can refer them? Referral for which specialty: Orthopedic Preferred provider/office: [facility]( rather go here instead of [facility] due to being closer to home and easy to get into)  Reason for referral: Balance

## 2018-10-26 NOTE — Telephone Encounter (Signed)
ordered

## 2018-11-04 ENCOUNTER — Other Ambulatory Visit: Payer: Self-pay | Admitting: Internal Medicine

## 2018-11-08 NOTE — Progress Notes (Signed)
Subjective:    Patient ID: Brett Franklin, male    DOB: 01-24-1926, 85 y.o.   MRN: [id number]  HPI The patient is here for an acute visit.   Ears stopped up, trouble hearing x 4-6 weeks. He has some mild nasal congestion, hearing loss, runny nose and occasional mild dizziness.  He denies fever, chills, ear pain, PND, sinus pain and sore throat.  He denies headaches. It is better when he lays down and then gets worse as the day goes on.   He saw his audiologist and he had his ear cleaned out.    Medications and allergies reviewed with patient and updated if appropriate.  Patient Active Problem List   Diagnosis Date Noted  . Poor balance 10/08/2018  . Right foot pain 01/13/2017  . Elevated TSH 05/03/2015  . Hyperlipidemia 07/21/2011  . OTHER DYSPHAGIA 03/04/2010  . History of stroke 12/31/2009  . LOW BACK PAIN SYNDROME 11/12/2009  . CERVICAL RADICULOPATHY, LEFT 05/11/2008  . NEPHROLITHIASIS, HX OF 05/11/2008  . Diabetes mellitus with neurological manifestations, controlled ([place]) 11/16/2007  . Anxiety 07/08/2007  . Melanoma of skin ([place]) 01/21/2007  . Essential hypertension 01/21/2007  . IBS 01/21/2007  . BPH (benign prostatic hyperplasia) 01/21/2007  . STENOSIS, CERVICAL SPINAL 01/21/2007  . History of adenomatous polyp of colon 01/21/2007    Current Outpatient Medications on File Prior to Visit  Medication Sig Dispense Refill  . amLODipine (NORVASC) 5 MG tablet TAKE 1 TABLET BY MOUTH EVERY DAY 90 tablet 1  . aspirin 325 MG tablet Take 325 mg by mouth daily.      . blood glucose meter kit and supplies KIT Dispense based on patient and insurance preference. Use to text blood sugars daily. 1 each 0  . glucose blood test strip Use to test blood sugars once daily 100 each 3  . JANUMET 50-500 MG tablet TAKE 1 TABLET BY MOUTH DAILY 90 tablet 1  . LORazepam (ATIVAN) 0.5 MG tablet Take 1 tablet (0.5 mg total) by mouth every 12 (twelve) hours as needed for anxiety. 20 tablet 1  .  metoprolol tartrate (LOPRESSOR) 25 MG tablet TAKE 0.5 TABLET BY MOUTH EVERY 12 HOURS, THEN TAKE 1 TABLET BY MOUTH TWICE DAILY IF BLOOD PRESSURE IS ABOVE 140/90 324 tablet 0  . Multiple Vitamins-Minerals (MACULAR VITAMIN BENEFIT PO) Take by mouth 2 (two) times daily.    Marland Kitchen neomycin-polymyxin b-dexamethasone (MAXITROL) 3.5-10000-0.1 OINT APPLY IN BOTH EYES AT BEDTIME UTD . USE LESS THAN 3 TIMES WEEKLY  4  . pravastatin (PRAVACHOL) 20 MG tablet TAKE 1 TABLET(20 MG) BY MOUTH DAILY 90 tablet 1  . valACYclovir (VALTREX) 1000 MG tablet TK 2 TS PO BID FOR 1 DAY FOR FEVER BLISTER  3   No current facility-administered medications on file prior to visit.     Past Medical History:  Diagnosis Date  . Adenomatous polyp of colon 11/1996   Dr Fuller Plan  . Anxiety   . BPH (benign prostatic hyperplasia)   . Cervical spinal stenosis   . CVA (cerebral vascular accident) ([place])   . Diabetes mellitus, type 2 ([place])   . GERD (gastroesophageal reflux disease)   . Hemorrhoids   . Hypertension   . IBS (irritable bowel syndrome)   . Melanoma ([place])   . Nephrolithiasis     X 4    Past Surgical History:  Procedure Laterality Date  . CATARACT EXTRACTION Left   . CHOLECYSTECTOMY    . COLONOSCOPY W/ POLYPECTOMY  Dr.Stark  . CYSTOSCOPY     Dr.Kimbrough     Social History   Socioeconomic History  . Marital status: Married    Spouse name: Not on file  . Number of children: 2  . Years of education: Not on file  . Highest education level: Not on file  Occupational History  . Occupation: retired    Fish farm manager: RETIRED  Social Needs  . Financial resource strain: Not hard at all  . Food insecurity:    Worry: Never true    Inability: Never true  . Transportation needs:    Medical: No    Non-medical: No  Tobacco Use  . Smoking status: Never Smoker  . Smokeless tobacco: Never Used  Substance and Sexual Activity  . Alcohol use: No  . Drug use: No  . Sexual activity: Not Currently  Lifestyle  . Physical  activity:    Days per week: 6 days    Minutes per session: 30 min  . Stress: Not at all  Relationships  . Social connections:    Talks on phone: More than three times a week    Gets together: More than three times a week    Attends religious service: More than 4 times per year    Active member of club or organization: Yes    Attends meetings of clubs or organizations: More than 4 times per year    Relationship status: Married  Other Topics Concern  . Not on file  Social History Narrative  . Not on file    Family History  Problem Relation Age of Onset  . Alcohol abuse Father   . Heart disease Father   . Heart attack Maternal Grandfather 65  . Diabetes type I Other        grandson  . Cancer Neg Hx   . Stroke Neg Hx     Review of Systems  Constitutional: Negative for chills and fever.  HENT: Positive for congestion, hearing loss and rhinorrhea. Negative for ear pain, postnasal drip, sinus pressure, sinus pain and sore throat.   Respiratory: Negative for cough, shortness of breath and wheezing.   Neurological: Positive for dizziness (a couple of times). Negative for light-headedness and headaches.       Objective:   Vitals:   11/09/18 1406  BP: 130/64  Pulse: (!) 56  Resp: 16  Temp: 98 F (36.7 C)  SpO2: 99%   BP Readings from Last 3 Encounters:  11/09/18 130/64  10/08/18 134/60  10/08/18 134/60   Wt Readings from Last 3 Encounters:  11/09/18 173 lb 12.8 oz (78.8 kg)  10/08/18 171 lb (77.6 kg)  10/08/18 171 lb (77.6 kg)   Body mass index is 24.94 kg/m.   Physical Exam    GENERAL APPEARANCE: Appears stated age, well appearing, NAD EYES: conjunctiva clear, no icterus HEENT: right ear canal with impacted cerumen, TM not visualized, left ear canal and TM normal, oropharynx with no erythema, no maxillary sinus tenderness, no thyromegaly, trachea midline, no cervical or supraclavicular lymphadenopathy LUNGS: Clear to auscultation without wheeze or crackles,  unlabored breathing, good air entry bilaterally CARDIOVASCULAR: Normal S1,S2 without murmurs, no edema SKIN: Warm, dry  I looked in patients ears prior to and after procedure.  PRE-PROCEDURE EXAM: Right TM cannot be visualized due to total occlusion/impaction of the ear canal. PROCEDURE INDICATION: remove wax to visualize ear drum & relieve discomfort/pressure CONSENT:  Verbal  PROCEDURE NOTE:   RIGHT EAR:  The CMA used a metal wax curette  under direct vision with an otoscope to free the wax bolus from the ear wall and then successfully removed a small bit of wax. The ear was then irrigated with warm water to remove the remaining wax. POST- PROCEDURE EXAM: TMs successfully visualized and found to have mild erythema.  There was some moisture and mild erythema in ear canal possibly from procedure.  He did have some improvement in his symptoms after wax was removed.      Assessment & Plan:    See Problem List for Assessment and Plan of chronic medical problems.

## 2018-11-09 ENCOUNTER — Ambulatory Visit (INDEPENDENT_AMBULATORY_CARE_PROVIDER_SITE_OTHER): Payer: Medicare Other | Admitting: Internal Medicine

## 2018-11-09 ENCOUNTER — Encounter: Payer: Self-pay | Admitting: Internal Medicine

## 2018-11-09 VITALS — BP 130/64 | HR 56 | Temp 98.0°F | Resp 16 | Ht 70.0 in | Wt 173.8 lb

## 2018-11-09 DIAGNOSIS — H6121 Impacted cerumen, right ear: Secondary | ICD-10-CM | POA: Diagnosis not present

## 2018-11-09 DIAGNOSIS — H60501 Unspecified acute noninfective otitis externa, right ear: Secondary | ICD-10-CM | POA: Insufficient documentation

## 2018-11-09 MED ORDER — CIPROFLOXACIN-DEXAMETHASONE 0.3-0.1 % OT SUSP: 4.0000 [drp] | Freq: Two times a day (BID) | OTIC | 0 refills | Status: DC

## 2018-11-09 NOTE — Assessment & Plan Note (Signed)
Mild otitis externa  Will try ciprodex x 7 days Call if symptoms do not resolve

## 2018-11-09 NOTE — Assessment & Plan Note (Signed)
Likely the cause of his symptoms All of wax successfully removed Some improvement after ear wax removed

## 2018-11-09 NOTE — Patient Instructions (Signed)
Use the ear drops as prescribed.  If your symptoms do not improve consider seeing an ENT.

## 2018-11-30 LAB — HM DIABETES EYE EXAM

## 2018-12-07 ENCOUNTER — Encounter: Payer: Self-pay | Admitting: Internal Medicine

## 2019-01-13 ENCOUNTER — Ambulatory Visit: Payer: Medicare Other | Admitting: Internal Medicine

## 2019-02-16 NOTE — Progress Notes (Signed)
Subjective:    Patient ID: Brett Franklin, male    DOB: 02-06-1926, 85 y.o.   MRN: [id number]  HPI The patient is here for follow up.  He is exercising regularly - walking twice a day.     ETD:  For 4 months he has been dealing with his ears.  He has seen me, his hearing specialist,and ENT.  He has tried a nasal spray prescribed by the first ENT and it did not help.  He saw a different ENT, Dr Benjamine Mola and had ear wax removed.   When he gets up in the morning his ears are not stopped up and clear and when he wears his hearing aids it is clear.  He feels pressure about 8-10 minutes after he gets up or takes his hearing aids out - then his ears feel stopped up.  He thinks it is better with laying down.  He would like to see a different specialist.   Diabetes: He is taking his medication daily as prescribed. He is compliant with a diabetic diet.  He monitors his sugars and they have been running 135-140.   Hypertension: He is taking his medication daily. He is compliant with a low sodium diet.  He denies chest pain, palpitations, edema, shortness of breath and regular headaches.  He does monitor his blood pressure at home - it is controlled.    Hyperlipidemia: He is taking his medication daily. He is compliant with a low fat/cholesterol diet. He denies myalgias.   Anxiety: He is taking the ativan only as needed.  He denies any side effects from the medication. He feels his anxiety is well controlled and he is happy with his current dose of medication and taking it as needed only.   Poor balance:  He did do PT.  He walks daily.  He does some of the exercises they taught him.  He feels his balance is better.     Medications and allergies reviewed with patient and updated if appropriate.  Patient Active Problem List   Diagnosis Date Noted  . Acute otitis externa of right ear 11/09/2018  . Poor balance 10/08/2018  . Right foot pain 01/13/2017  . Elevated TSH 05/03/2015  . Hyperlipidemia  07/21/2011  . OTHER DYSPHAGIA 03/04/2010  . History of stroke 12/31/2009  . LOW BACK PAIN SYNDROME 11/12/2009  . CERVICAL RADICULOPATHY, LEFT 05/11/2008  . NEPHROLITHIASIS, HX OF 05/11/2008  . Diabetes mellitus with neurological manifestations, controlled ([place]) 11/16/2007  . Anxiety 07/08/2007  . Melanoma of skin ([place]) 01/21/2007  . Essential hypertension 01/21/2007  . IBS 01/21/2007  . BPH (benign prostatic hyperplasia) 01/21/2007  . STENOSIS, CERVICAL SPINAL 01/21/2007  . History of adenomatous polyp of colon 01/21/2007    Current Outpatient Medications on File Prior to Visit  Medication Sig Dispense Refill  . amLODipine (NORVASC) 5 MG tablet TAKE 1 TABLET BY MOUTH EVERY DAY 90 tablet 1  . aspirin 325 MG tablet Take 325 mg by mouth daily.      . blood glucose meter kit and supplies KIT Dispense based on patient and insurance preference. Use to text blood sugars daily. 1 each 0  . erythromycin ophthalmic ointment APPLY IN BOTH EYES HS PRN. USE NO MORE THAN 3 TIMES PER WEEK    . glucose blood test strip Use to test blood sugars once daily 100 each 3  . JANUMET 50-500 MG tablet TAKE 1 TABLET BY MOUTH DAILY 90 tablet 1  . LORazepam (ATIVAN) 0.5 MG  tablet Take 1 tablet (0.5 mg total) by mouth every 12 (twelve) hours as needed for anxiety. 20 tablet 1  . metoprolol tartrate (LOPRESSOR) 25 MG tablet TAKE 0.5 TABLET BY MOUTH EVERY 12 HOURS, THEN TAKE 1 TABLET BY MOUTH TWICE DAILY IF BLOOD PRESSURE IS ABOVE 140/90 324 tablet 0  . Multiple Vitamins-Minerals (MACULAR VITAMIN BENEFIT PO) Take by mouth 2 (two) times daily.    . mupirocin ointment (BACTROBAN) 2 %     . neomycin-polymyxin b-dexamethasone (MAXITROL) 3.5-10000-0.1 OINT APPLY IN BOTH EYES AT BEDTIME UTD . USE LESS THAN 3 TIMES WEEKLY  4  . pravastatin (PRAVACHOL) 20 MG tablet TAKE 1 TABLET(20 MG) BY MOUTH DAILY 90 tablet 1  . valACYclovir (VALTREX) 1000 MG tablet TK 2 TS PO BID FOR 1 DAY FOR FEVER BLISTER  3   No current  facility-administered medications on file prior to visit.     Past Medical History:  Diagnosis Date  . Adenomatous polyp of colon 11/1996   Dr Fuller Plan  . Anxiety   . BPH (benign prostatic hyperplasia)   . Cervical spinal stenosis   . CVA (cerebral vascular accident) ([place])   . Diabetes mellitus, type 2 ([place])   . GERD (gastroesophageal reflux disease)   . Hemorrhoids   . Hypertension   . IBS (irritable bowel syndrome)   . Melanoma ([place])   . Nephrolithiasis     X 4    Past Surgical History:  Procedure Laterality Date  . CATARACT EXTRACTION Left   . CHOLECYSTECTOMY    . COLONOSCOPY W/ POLYPECTOMY     Dr.Stark  . CYSTOSCOPY     Dr.Kimbrough     Social History   Socioeconomic History  . Marital status: Married    Spouse name: Not on file  . Number of children: 2  . Years of education: Not on file  . Highest education level: Not on file  Occupational History  . Occupation: retired    Fish farm manager: RETIRED  Social Needs  . Financial resource strain: Not hard at all  . Food insecurity:    Worry: Never true    Inability: Never true  . Transportation needs:    Medical: No    Non-medical: No  Tobacco Use  . Smoking status: Never Smoker  . Smokeless tobacco: Never Used  Substance and Sexual Activity  . Alcohol use: No  . Drug use: No  . Sexual activity: Not Currently  Lifestyle  . Physical activity:    Days per week: 6 days    Minutes per session: 30 min  . Stress: Not at all  Relationships  . Social connections:    Talks on phone: More than three times a week    Gets together: More than three times a week    Attends religious service: More than 4 times per year    Active member of club or organization: Yes    Attends meetings of clubs or organizations: More than 4 times per year    Relationship status: Married  Other Topics Concern  . Not on file  Social History Narrative  . Not on file    Family History  Problem Relation Age of Onset  . Alcohol abuse Father    . Heart disease Father   . Heart attack Maternal Grandfather 65  . Diabetes type I Other        grandson  . Cancer Neg Hx   . Stroke Neg Hx     Review of Systems  Constitutional: Negative  for chills and fever.  HENT: Negative for congestion, ear pain, sinus pressure and sinus pain.   Respiratory: Negative for cough, shortness of breath and wheezing.   Cardiovascular: Negative for chest pain, palpitations and leg swelling.  Neurological: Negative for dizziness, light-headedness and headaches.       Objective:   Vitals:   02/17/19 0822  BP: (!) 178/88  Pulse: (!) 57  Temp: 97.6 F (36.4 C)  SpO2: 99%   BP Readings from Last 3 Encounters:  02/17/19 (!) 178/88  11/09/18 130/64  10/08/18 134/60   Wt Readings from Last 3 Encounters:  02/17/19 169 lb (76.7 kg)  11/09/18 173 lb 12.8 oz (78.8 kg)  10/08/18 171 lb (77.6 kg)   Body mass index is 24.25 kg/m.   Physical Exam    Constitutional: Appears well-developed and well-nourished. No distress.  HENT:  Head: Normocephalic and atraumatic.  Neck: Neck supple. No tracheal deviation present. No thyromegaly present.  No cervical lymphadenopathy Cardiovascular: Normal rate, regular rhythm and normal heart sounds.   No murmur heard. No carotid bruit .  No edema Pulmonary/Chest: Effort normal and breath sounds normal. No respiratory distress. No has no wheezes. No rales.  Skin: Skin is warm and dry. Not diaphoretic.  Psychiatric: Normal mood and affect. Behavior is normal.      Assessment & Plan:    See Problem List for Assessment and Plan of chronic medical problems.

## 2019-02-17 ENCOUNTER — Ambulatory Visit (INDEPENDENT_AMBULATORY_CARE_PROVIDER_SITE_OTHER): Payer: Medicare Other | Admitting: Internal Medicine

## 2019-02-17 ENCOUNTER — Other Ambulatory Visit: Payer: Self-pay

## 2019-02-17 ENCOUNTER — Encounter: Payer: Self-pay | Admitting: Internal Medicine

## 2019-02-17 VITALS — BP 178/88 | HR 57 | Temp 97.6°F | Ht 70.0 in | Wt 169.0 lb

## 2019-02-17 DIAGNOSIS — F419 Anxiety disorder, unspecified: Secondary | ICD-10-CM

## 2019-02-17 DIAGNOSIS — I10 Essential (primary) hypertension: Secondary | ICD-10-CM | POA: Diagnosis not present

## 2019-02-17 DIAGNOSIS — R7989 Other specified abnormal findings of blood chemistry: Secondary | ICD-10-CM | POA: Diagnosis not present

## 2019-02-17 DIAGNOSIS — R2689 Other abnormalities of gait and mobility: Secondary | ICD-10-CM

## 2019-02-17 DIAGNOSIS — E1149 Type 2 diabetes mellitus with other diabetic neurological complication: Secondary | ICD-10-CM | POA: Diagnosis not present

## 2019-02-17 DIAGNOSIS — H6983 Other specified disorders of Eustachian tube, bilateral: Secondary | ICD-10-CM

## 2019-02-17 DIAGNOSIS — E7849 Other hyperlipidemia: Secondary | ICD-10-CM

## 2019-02-17 DIAGNOSIS — H6990 Unspecified Eustachian tube disorder, unspecified ear: Secondary | ICD-10-CM | POA: Insufficient documentation

## 2019-02-17 DIAGNOSIS — H6980 Other specified disorders of Eustachian tube, unspecified ear: Secondary | ICD-10-CM | POA: Insufficient documentation

## 2019-02-17 MED ORDER — LORAZEPAM 0.5 MG PO TABS: 0.5000 mg | ORAL_TABLET | Freq: Two times a day (BID) | ORAL | 1 refills | Status: DC | PRN

## 2019-02-17 NOTE — Assessment & Plan Note (Signed)
Improved after doing PT Still doing PT exercises Walking regularly Continue PT exercises at home

## 2019-02-17 NOTE — Assessment & Plan Note (Signed)
Clinically euthyroid Will check tsh at his next visit

## 2019-02-17 NOTE — Patient Instructions (Addendum)
    Medications reviewed and updated.  Changes include :   none  Your prescription(s) have been submitted to your pharmacy. Please take as directed and contact our office if you believe you are having problem(s) with the medication(s).  A referral was ordered for ENT - Dr Ernesto Rutherford  Please followup in 3 months

## 2019-02-17 NOTE — Assessment & Plan Note (Signed)
Taking ativan prn - effective and well tolerated continue

## 2019-02-17 NOTE — Assessment & Plan Note (Signed)
Continue statin. 

## 2019-02-17 NOTE — Assessment & Plan Note (Signed)
Sugars well controlled at home Will hold off on blood work today Continue to monitor at home Diet controlled Continue regular exercise

## 2019-02-17 NOTE — Assessment & Plan Note (Signed)
BP elevated here today, but typically controlled and has been controlled at home BP overall controlled Current regimen effective and well tolerated Continue current medications at current doses

## 2019-02-17 NOTE — Assessment & Plan Note (Signed)
Persistent x 4 months Steroid nasal spray has not helped Has seen ENT x 2 and audiologist Would like to see a different ENT - will refer to dr Ernesto Rutherford

## 2019-02-18 ENCOUNTER — Telehealth: Payer: Self-pay

## 2019-02-18 NOTE — Telephone Encounter (Signed)
LVM for pt to call back in regards to referral to ENT that was put in on 02/17/19.

## 2019-02-22 NOTE — Telephone Encounter (Signed)
Spoke to patients wife and she was informed that provider is retired and the referral will be sent somewhere else.

## 2019-02-25 DIAGNOSIS — H6983 Other specified disorders of Eustachian tube, bilateral: Secondary | ICD-10-CM | POA: Insufficient documentation

## 2019-03-14 ENCOUNTER — Other Ambulatory Visit: Payer: Self-pay | Admitting: Internal Medicine

## 2019-03-18 ENCOUNTER — Encounter: Payer: Self-pay | Admitting: Internal Medicine

## 2019-03-18 ENCOUNTER — Ambulatory Visit (INDEPENDENT_AMBULATORY_CARE_PROVIDER_SITE_OTHER): Payer: Medicare Other | Admitting: Internal Medicine

## 2019-03-18 ENCOUNTER — Ambulatory Visit: Payer: Self-pay | Admitting: Internal Medicine

## 2019-03-18 ENCOUNTER — Other Ambulatory Visit (INDEPENDENT_AMBULATORY_CARE_PROVIDER_SITE_OTHER): Payer: Medicare Other

## 2019-03-18 ENCOUNTER — Other Ambulatory Visit: Payer: Self-pay

## 2019-03-18 VITALS — BP 120/90 | HR 125 | Temp 97.8°F | Ht 70.0 in | Wt 170.0 lb

## 2019-03-18 DIAGNOSIS — I4891 Unspecified atrial fibrillation: Secondary | ICD-10-CM

## 2019-03-18 DIAGNOSIS — R000 Tachycardia, unspecified: Secondary | ICD-10-CM

## 2019-03-18 DIAGNOSIS — R7989 Other specified abnormal findings of blood chemistry: Secondary | ICD-10-CM

## 2019-03-18 LAB — CBC WITH DIFFERENTIAL/PLATELET
Basophils Absolute: 0.1 10*3/uL (ref 0.0–0.1)
Basophils Relative: 0.4 % (ref 0.0–3.0)
Eosinophils Absolute: 0.1 10*3/uL (ref 0.0–0.7)
Eosinophils Relative: 0.9 % (ref 0.0–5.0)
HCT: 46.2 % (ref 39.0–52.0)
Hemoglobin: 15.4 g/dL (ref 13.0–17.0)
Lymphocytes Relative: 15.2 % (ref 12.0–46.0)
Lymphs Abs: 1.8 10*3/uL (ref 0.7–4.0)
MCHC: 33.2 g/dL (ref 30.0–36.0)
MCV: 98.3 fl (ref 78.0–100.0)
Monocytes Absolute: 1 10*3/uL (ref 0.1–1.0)
Monocytes Relative: 8.7 % (ref 3.0–12.0)
Neutro Abs: 8.7 10*3/uL — ABNORMAL HIGH (ref 1.4–7.7)
Neutrophils Relative %: 74.8 % (ref 43.0–77.0)
Platelets: 191 10*3/uL (ref 150.0–400.0)
RBC: 4.7 Mil/uL (ref 4.22–5.81)
RDW: 15 % (ref 11.5–15.5)
WBC: 11.6 10*3/uL — ABNORMAL HIGH (ref 4.0–10.5)

## 2019-03-18 MED ORDER — METOPROLOL TARTRATE 25 MG PO TABS: 25.0000 mg | ORAL_TABLET | Freq: Two times a day (BID) | ORAL | 1 refills | Status: DC

## 2019-03-18 MED ORDER — APIXABAN 5 MG PO TABS: 5.0000 mg | ORAL_TABLET | Freq: Two times a day (BID) | ORAL | 5 refills | Status: DC

## 2019-03-18 NOTE — Patient Instructions (Addendum)
Your EKG shows that you have Atrial Fibrillation.     Have blood work done today.   Stop the aspirin.  Start eliquis twice a day.   Stop the amlodipine.  Increase metoprolol to one full pill twice a day.   A referral was ordered for cardiology. They will call you.      Atrial Fibrillation Atrial fibrillation is a type of irregular or rapid heartbeat (arrhythmia). In atrial fibrillation, the top part of the heart (atria) quivers in a chaotic pattern. This makes the heart unable to pump blood normally. Having atrial fibrillation can increase your risk for other health problems, such as:  Blood can pool in the atria and form clots. If a clot travels to the brain, it can cause a stroke.  The heart muscle may weaken from the irregular blood flow. This can cause heart failure. Atrial fibrillation may start suddenly and stop on its own, or it may become a long-lasting problem. What are the causes? This condition is caused by some heart-related conditions or procedures, including:  High blood pressure. This is the most common cause.  Heart failure.  Heart valve conditions.  Inflammation of the sac that surrounds the heart (pericarditis).  Heart surgery.  Coronary artery disease.  Certain heart rhythm disorders, such as Wolf-Parkinson-White syndrome. Other causes include:  Pneumonia.  Obstructive sleep apnea.  Lung cancer.  Thyroid problems, especially if the thyroid is overactive (hyperthyroidism).  Excessive alcohol or drug use. Sometimes, the cause of this condition is not known. What increases the risk? This condition is more likely to develop in:  Older people.  People who smoke.  People who have diabetes mellitus.  People who are overweight (obese).  Athletes who exercise vigorously.  People who have a family history. What are the signs or symptoms? Symptoms of this condition include:  A feeling that your heart is beating rapidly or irregularly.  A  feeling of discomfort or pain in your chest.  Shortness of breath.  Sudden light-headedness or weakness.  Getting tired easily during exercise. In some cases, there are no symptoms. How is this diagnosed? Your health care provider may be able to detect atrial fibrillation when taking your pulse. If detected, this condition may be diagnosed with:  Electrocardiogram (ECG).  Ambulatory cardiac monitor. This device records your heartbeats for 24 hours or more.  Transthoracic echocardiogram (TTE) to evaluate how blood flows through your heart.  Transesophageal echocardiogram (TEE) to view more detailed images of your heart.  A stress test.  Imaging tests, such as a CT scan or chest X-ray.  Blood tests. How is this treated? This condition may be treated with:  Medicines to slow down the heart rate or bring the heart's rhythm back to normal.  Medicines to prevent blood clots from forming.  Electrical cardioversion. This delivers a low-energy shock to the heart to reset its rhythm.  Ablation. This procedure destroys the part of the heart tissue that sends abnormal signals.  Left atrial appendage occlusion/excision. This seals off a common place in the atria where blood clots can form (left atrial appendage). The goal of treatment is to prevent blood clots from forming and to keep your heart beating at a normal rate and rhythm. Treatment depends on underlying medical conditions and how you feel when you are experiencing fibrillation. Follow these instructions at home: Medicines  Take over-the counter and prescription medicines only as told by your health care provider.  If your health care provider prescribed a blood-thinning medicine (anticoagulant),  take it exactly as told. Taking too much blood-thinning medicine can cause bleeding. Taking too little can enable a blood clot to form and travel to the brain, causing a stroke. Lifestyle      Do not use any products that contain  nicotine or tobacco, such as cigarettes and e-cigarettes. If you need help quitting, ask your health care provider.  Do not drink beverages that contain caffeine, such as coffee, soda, and tea.  Follow diet instructions as told by your health care provider.  Exercise regularly as told by your health care provider.  Do not drink alcohol. General instructions  If you have obstructive sleep apnea, manage your condition as told by your health care provider.  Maintain a healthy weight. Do not use diet pills unless your health care provider approves. Diet pills may make heart problems worse.  Keep all follow-up visits as told by your health care provider. This is important. Contact a health care provider if you:  Notice a change in the rate, rhythm, or strength of your heartbeat.  Are taking an anticoagulant and you notice increased bruising.  Tire more easily when you exercise or exert yourself.  Have a sudden change in weight. Get help right away if you have:   Chest pain, abdominal pain, sweating, or weakness.  Difficulty breathing.  Blood in your vomit, stool (feces), or urine.  Any symptoms of a stroke. "BE FAST" is an easy way to remember the main warning signs of a stroke: ? B - Balance. Signs are dizziness, sudden trouble walking, or loss of balance. ? E - Eyes. Signs are trouble seeing or a sudden change in vision. ? F - Face. Signs are sudden weakness or numbness of the face, or the face or eyelid drooping on one side. ? A - Arms. Signs are weakness or numbness in an arm. This happens suddenly and usually on one side of the body. ? S - Speech. Signs are sudden trouble speaking, slurred speech, or trouble understanding what people say. ? T - Time. Time to call emergency services. Write down what time symptoms started.  Other signs of a stroke, such as: ? A sudden, severe headache with no known cause. ? Nausea or vomiting. ? Seizure. These symptoms may represent a  serious problem that is an emergency. Do not wait to see if the symptoms will go away. Get medical help right away. Call your local emergency services (911 in the [place] Do not drive yourself to the hospital. Summary  Atrial fibrillation is a type of irregular or rapid heartbeat (arrhythmia).  Symptoms include a feeling that your heart is beating fast or irregularly. In some cases, you may not have symptoms.  The condition is treated with medicines to slow down the heart rate or bring the heart's rhythm back to normal. You may also need blood-thinning medicines to prevent blood clots.  Get help right away if you have symptoms or signs of a stroke. This information is not intended to replace advice given to you by your health care provider. Make sure you discuss any questions you have with your health care provider. Document Released: 09/15/2005 Document Revised: 11/06/2017 Document Reviewed: 11/06/2017 Elsevier Interactive Patient Education  2019 [employer].

## 2019-03-18 NOTE — Assessment & Plan Note (Addendum)
He felt tired last night and at that time he was hypotensive with an elevated heart rate.  He is got similar measures since then No other symptoms including palpitations, chest pain, shortness of breath or lightheadedness EKG here shows atrial fibrillation at 97 bpm.  This is new compared to his previous EKG from 2017, which showed sinus bradycardia Stop aspirin 325 mg and start Eliquis 5 mg twice daily Will discontinue amlodipine and increase metoprolol from 12.5 mg twice daily to 25 mg twice daily Check CBC, CMP, TSH Will refer to cardiology Discussed his diagnosis Advised his wife that if they have any concerns or questions or problems over the weekend to call the on-call doctor Advised her to call me on Monday with an update

## 2019-03-18 NOTE — Telephone Encounter (Signed)
Pt.'s wife reports pt.'s heart rate was 141 last night. This morning it is 126. Pt. Denies any other symptoms - no chest pain. Dizziness or shortness of breath. Warm transfer to [facility] in the practice for a visit.  Answer Assessment - Initial Assessment Questions 1. DESCRIPTION: "Please describe your heart rate or heart beat that you are having" (e.g., fast/slow, regular/irregular, skipped or extra beats, "palpitations")     Feels fine 2. ONSET: "When did it start?" (Minutes, hours or days)      Last night 3. DURATION: "How long does it last" (e.g., seconds, minutes, hours)     Unsure 4. PATTERN "Does it come and go, or has it been constant since it started?"  "Does it get worse with exertion?"   "Are you feeling it now?"     Feels 5. TAP: "Using your hand, can you tap out what you are feeling on a chair or table in front of you, so that I can hear?" (Note: not all patients can do this)       No 6. HEART RATE: "Can you tell me your heart rate?" "How many beats in 15 seconds?"  (Note: not all patients can do this)       141, 126, 143, 126 7. RECURRENT SYMPTOM: "Have you ever had this before?" If so, ask: "When was the last time?" and "What happened that time?"      No 8. CAUSE: "What do you think is causing the palpitations?"     Unsure 9. CARDIAC HISTORY: "Do you have any history of heart disease?" (e.g., heart attack, angina, bypass surgery, angioplasty, arrhythmia)      No 10. OTHER SYMPTOMS: "Do you have any other symptoms?" (e.g., dizziness, chest pain, sweating, difficulty breathing)       No 11. PREGNANCY: "Is there any chance you are pregnant?" "When was your last menstrual period?"       n/a  Protocols used: HEART RATE AND HEARTBEAT QUESTIONS-A-AH

## 2019-03-18 NOTE — Telephone Encounter (Signed)
Appt scheduled

## 2019-03-18 NOTE — Progress Notes (Signed)
Subjective:    Patient ID: Brett Franklin, male    DOB: 1926-02-26, 85 y.o.   MRN: [id number]  HPI The patient is here for an acute visit.  He is here with his wife.  Last night he felt tired and he took his BP and it was 87/65 with a heart rate of 125.  And his wife took his blood pressure several more times throughout the night and it was typically in the 90s/70s with an elevated heart rate.  The highest his heart rate was at 1 point was 143.  This morning his blood pressure was 101/86 and his heart rate was 126.  At 3:00 this afternoon his blood pressure was 94/53 and his heart rate was 64.  He denies any chest pain, palpitations, shortness of breath or lightheadedness.  He does not feel as tired as he did last night.  He states overall his stamina with walking has decreased.  He is taking all of his medications as prescribed.   Medications and allergies reviewed with patient and updated if appropriate.  Patient Active Problem List   Diagnosis Date Noted  . ETD (eustachian tube dysfunction) 02/17/2019  . Acute otitis externa of right ear 11/09/2018  . Poor balance 10/08/2018  . Right foot pain 01/13/2017  . Elevated TSH 05/03/2015  . Hyperlipidemia 07/21/2011  . OTHER DYSPHAGIA 03/04/2010  . History of stroke 12/31/2009  . LOW BACK PAIN SYNDROME 11/12/2009  . CERVICAL RADICULOPATHY, LEFT 05/11/2008  . NEPHROLITHIASIS, HX OF 05/11/2008  . Diabetes mellitus with neurological manifestations, controlled ([employer]) 11/16/2007  . Anxiety 07/08/2007  . Melanoma of skin ([place]) 01/21/2007  . Essential hypertension 01/21/2007  . IBS 01/21/2007  . BPH (benign prostatic hyperplasia) 01/21/2007  . STENOSIS, CERVICAL SPINAL 01/21/2007  . History of adenomatous polyp of colon 01/21/2007    Current Outpatient Medications on File Prior to Visit  Medication Sig Dispense Refill  . amLODipine (NORVASC) 5 MG tablet TAKE 1 TABLET BY MOUTH EVERY DAY 90 tablet 1  . aspirin 325 MG tablet Take 325  mg by mouth daily.      . blood glucose meter kit and supplies KIT Dispense based on patient and insurance preference. Use to text blood sugars daily. 1 each 0  . erythromycin ophthalmic ointment APPLY IN BOTH EYES HS PRN. USE NO MORE THAN 3 TIMES PER WEEK    . glucose blood test strip Use to test blood sugars once daily 100 each 3  . JANUMET 50-500 MG tablet TAKE 1 TABLET BY MOUTH DAILY 90 tablet 1  . LORazepam (ATIVAN) 0.5 MG tablet Take 1 tablet (0.5 mg total) by mouth every 12 (twelve) hours as needed for anxiety. 20 tablet 1  . metoprolol tartrate (LOPRESSOR) 25 MG tablet TAKE 0.5 TABLET BY MOUTH EVERY 12 HOURS, THEN TAKE 1 TABLET BY MOUTH TWICE DAILY IF BLOOD PRESSURE IS ABOVE 140/90 324 tablet 0  . Multiple Vitamins-Minerals (MACULAR VITAMIN BENEFIT PO) Take by mouth 2 (two) times daily.    . mupirocin ointment (BACTROBAN) 2 %     . neomycin-polymyxin b-dexamethasone (MAXITROL) 3.5-10000-0.1 OINT APPLY IN BOTH EYES AT BEDTIME UTD . USE LESS THAN 3 TIMES WEEKLY  4  . pravastatin (PRAVACHOL) 20 MG tablet TAKE 1 TABLET(20 MG) BY MOUTH DAILY 90 tablet 0  . valACYclovir (VALTREX) 1000 MG tablet TK 2 TS PO BID FOR 1 DAY FOR FEVER BLISTER  3   No current facility-administered medications on file prior to visit.  Past Medical History:  Diagnosis Date  . Adenomatous polyp of colon 11/1996   Dr Fuller Plan  . Anxiety   . BPH (benign prostatic hyperplasia)   . Cervical spinal stenosis   . CVA (cerebral vascular accident) ([place])   . Diabetes mellitus, type 2 ([place])   . GERD (gastroesophageal reflux disease)   . Hemorrhoids   . Hypertension   . IBS (irritable bowel syndrome)   . Melanoma ([place])   . Nephrolithiasis     X 4    Past Surgical History:  Procedure Laterality Date  . CATARACT EXTRACTION Left   . CHOLECYSTECTOMY    . COLONOSCOPY W/ POLYPECTOMY     Dr.Stark  . CYSTOSCOPY     Dr.Kimbrough     Social History   Socioeconomic History  . Marital status: Married    Spouse name:  Not on file  . Number of children: 2  . Years of education: Not on file  . Highest education level: Not on file  Occupational History  . Occupation: retired    Fish farm manager: RETIRED  Social Needs  . Financial resource strain: Not hard at all  . Food insecurity    Worry: Never true    Inability: Never true  . Transportation needs    Medical: No    Non-medical: No  Tobacco Use  . Smoking status: Never Smoker  . Smokeless tobacco: Never Used  Substance and Sexual Activity  . Alcohol use: No  . Drug use: No  . Sexual activity: Not Currently  Lifestyle  . Physical activity    Days per week: 6 days    Minutes per session: 30 min  . Stress: Not at all  Relationships  . Social connections    Talks on phone: More than three times a week    Gets together: More than three times a week    Attends religious service: More than 4 times per year    Active member of club or organization: Yes    Attends meetings of clubs or organizations: More than 4 times per year    Relationship status: Married  Other Topics Concern  . Not on file  Social History Narrative  . Not on file    Family History  Problem Relation Age of Onset  . Alcohol abuse Father   . Heart disease Father   . Heart attack Maternal Grandfather 65  . Diabetes type I Other        grandson  . Cancer Neg Hx   . Stroke Neg Hx     Review of Systems  Constitutional: Negative for chills and fever.  Respiratory: Negative for cough, shortness of breath and wheezing.   Cardiovascular: Negative for chest pain, palpitations and leg swelling.  Neurological: Negative for light-headedness and headaches.       Objective:   Vitals:   03/18/19 1611  BP: 120/90  Pulse: (!) 125  Temp: 97.8 F (36.6 C)  SpO2: 98%   BP Readings from Last 3 Encounters:  03/18/19 120/90  02/17/19 (!) 178/88  11/09/18 130/64   Wt Readings from Last 3 Encounters:  03/18/19 170 lb (77.1 kg)  02/17/19 169 lb (76.7 kg)  11/09/18 173 lb 12.8 oz  (78.8 kg)   Body mass index is 24.39 kg/m.   Physical Exam    Constitutional: Appears well-developed and well-nourished. No distress.  HENT:  Head: Normocephalic and atraumatic.  Neck: Neck supple. No tracheal deviation present. No thyromegaly present.  No cervical lymphadenopathy Cardiovascular: Tachycardia, heart  rate sounds regular.   No murmur heard.  No edema Pulmonary/Chest: Effort normal and breath sounds normal. No respiratory distress. No has no wheezes. No rales.  Skin: Skin is warm and dry. Not diaphoretic.  Psychiatric: Normal mood and affect. Behavior is normal.       Assessment & Plan:    See Problem List for Assessment and Plan of chronic medical problems.

## 2019-03-21 ENCOUNTER — Other Ambulatory Visit: Payer: Self-pay | Admitting: Internal Medicine

## 2019-03-21 ENCOUNTER — Telehealth: Payer: Self-pay | Admitting: Emergency Medicine

## 2019-03-21 LAB — COMPREHENSIVE METABOLIC PANEL
ALT: 9 U/L (ref 0–53)
AST: 15 U/L (ref 0–37)
Albumin: 4.2 g/dL (ref 3.5–5.2)
Alkaline Phosphatase: 45 U/L (ref 39–117)
BUN: 26 mg/dL — ABNORMAL HIGH (ref 6–23)
CO2: 21 mEq/L (ref 19–32)
Calcium: 9.4 mg/dL (ref 8.4–10.5)
Chloride: 103 mEq/L (ref 96–112)
Creatinine, Ser: 1.13 mg/dL (ref 0.40–1.50)
GFR: 60.79 mL/min (ref >60.00)
Glucose, Bld: 147 mg/dL — ABNORMAL HIGH (ref 70–99)
Potassium: 4.5 mEq/L (ref 3.5–5.1)
Sodium: 138 mEq/L (ref 135–145)
Total Bilirubin: 0.6 mg/dL (ref 0.2–1.2)
Total Protein: 6.7 g/dL (ref 6.0–8.3)

## 2019-03-21 LAB — TSH: TSH: 5.53 u[IU]/mL — ABNORMAL HIGH (ref 0.35–4.50)

## 2019-03-21 MED ORDER — LEVOTHYROXINE SODIUM 25 MCG PO TABS: 25.0000 ug | ORAL_TABLET | Freq: Every day | ORAL | 1 refills | Status: DC

## 2019-03-21 NOTE — Telephone Encounter (Signed)
Spoke with pt's wife to inform.  

## 2019-03-21 NOTE — Telephone Encounter (Signed)
Continue current medications.  I am hesitant to increase his medication because his BP is on the low side. They should hear from cardiology in the next couple of days to schedule an appointment with them and they may adjust his medications.

## 2019-03-21 NOTE — Telephone Encounter (Signed)
Pt wife called to give update on BP and HR:  5/19 pm : 104/82  125 5/20 am: 85/64   125 5/20 pm: 112/72  93 5/20 pm 109/67  65 5/21 am: 100/75  85 5/21 pm: 122/85  93 5/22 am: 100/67  91  States pt is a little more tired than usual and unable to exercise. Please advise if any changes need to be made.

## 2019-03-21 NOTE — Addendum Note (Signed)
Addended by: Delice Bison E on: 03/21/2019 04:36 PM   Modules accepted: Orders

## 2019-03-22 ENCOUNTER — Telehealth: Payer: Self-pay | Admitting: *Deleted

## 2019-03-22 NOTE — Telephone Encounter (Signed)
Calling patient today to discuss upcoming appointment.  We are currently trying to limit exposure to the virus that causes COVID-19 by seeing patients at home rather than in the office. We would like to schedule this appointment as a Psychologist, counselling. Unable to reach patient.  VM full on house phone.  No VM on mobile phone.

## 2019-03-23 ENCOUNTER — Telehealth: Payer: Self-pay | Admitting: Cardiology

## 2019-03-23 NOTE — Telephone Encounter (Signed)
Follow up     [place]  I hereby voluntarily request, consent and authorize [facility] and its employed or contracted physicians, physician assistants, nurse practitioners or other licensed health care professionals (the Practitioner), to provide me with telemedicine health care services (the "Services") as deemed necessary by the treating Practitioner. I acknowledge and consent to receive the Services by the Practitioner via telemedicine. I understand that the telemedicine visit will involve communicating with the Practitioner through live audiovisual communication technology and the disclosure of certain medical information by electronic transmission. I acknowledge that I have been given the opportunity to request an in-person assessment or other available alternative prior to the telemedicine visit and am voluntarily participating in the telemedicine visit.  I understand that I have the right to withhold or withdraw my consent to the use of telemedicine in the course of my care at any time, without affecting my right to future care or treatment, and that the Practitioner or I may terminate the  telemedicine visit at any time. I understand that I have the right to inspect all information obtained and/or recorded in the course of the telemedicine visit and may receive copies of available information for a reasonable fee. I understand that some of the potential risks of receiving the Services via telemedicine include:  Delay or interruption in medical evaluation due to technological equipment failure or disruption;  Information transmitted may not be sufficient (e.g. poor resolution of images) to allow for appropriate medical decision making by the Practitioner; and/or  In rare instances, security protocols could fail, causing a breach of personal health information.  Furthermore, I acknowledge that it is my responsibility to provide information  about my medical history, conditions and care that is complete and accurate to the best of my ability. I acknowledge that Practitioner's advice, recommendations, and/or decision may be based on factors not within their control, such as incomplete or inaccurate data provided by me or distortions of diagnostic images or specimens that may result from electronic transmissions. I understand that the practice of medicine is not an exact science and that Practitioner makes no warranties or guarantees regarding treatment outcomes. I acknowledge that I will receive a copy of this consent concurrently upon execution via email to the email address I last provided but may also request a printed copy by calling the office of [place].  I understand that my insurance will be billed for this visit.  I have read or had this consent read to me.  I understand the contents of this consent, which adequately explains the benefits and risks of the Services being provided via telemedicine.  I have been provided ample opportunity to ask questions regarding this consent and the Services and have had my questions answered to my satisfaction.  I give my informed consent for the services to be provided through the use of telemedicine in my medical care  By participating in this telemedicine visit I agree to the above YES

## 2019-03-23 NOTE — Telephone Encounter (Signed)
New Message     Left voicemail for pt to return my call and confirm appt and go over Consent

## 2019-03-24 ENCOUNTER — Ambulatory Visit (INDEPENDENT_AMBULATORY_CARE_PROVIDER_SITE_OTHER): Payer: Medicare Other | Admitting: Cardiology

## 2019-03-24 ENCOUNTER — Encounter: Payer: Self-pay | Admitting: Cardiology

## 2019-03-24 ENCOUNTER — Other Ambulatory Visit: Payer: Self-pay

## 2019-03-24 VITALS — BP 122/80 | HR 127 | Ht 70.0 in | Wt 169.0 lb

## 2019-03-24 DIAGNOSIS — I480 Paroxysmal atrial fibrillation: Secondary | ICD-10-CM

## 2019-03-24 MED ORDER — DILTIAZEM HCL ER COATED BEADS 180 MG PO CP24: 180.0000 mg | ORAL_CAPSULE | Freq: Every day | ORAL | 3 refills | Status: DC

## 2019-03-24 NOTE — Progress Notes (Signed)
Electrophysiology Office Note   Date:  03/24/2019   ID:  Brett Franklin, DOB 07/05/1926, MRN [id number]  PCP:  Brett Rail, MD  Cardiologist:   Primary Electrophysiologist:  Brett Meredith Leeds, MD    No chief complaint on file.    History of Present Illness: Brett Franklin is a 85 y.o. male who is being seen today for the evaluation of AF/flutter at the request of Burns, Claudina Lick, MD. Presenting today for electrophysiology evaluation.  He has a history of hypertension.  He presented to his primary physician last Friday after finding that his heart rate was fast and his blood pressure was low on his home blood pressure cuff.  His heart rates reportedly were in the 140s.  His primary physician found him to be in atrial fibrillation.  He did have weakness and fatigue at that time.  His metoprolol dose was increased to 25 mg twice a day.  He comes into clinic today reporting mild levels of fatigue.  He is tachycardic with heart rates in the 120s.  Today, he denies symptoms of palpitations, chest pain, shortness of breath, orthopnea, PND, lower extremity edema, claudication, dizziness, presyncope, syncope, bleeding, or neurologic sequela. The patient is tolerating medications without difficulties.    Past Medical History:  Diagnosis Date  . Adenomatous polyp of colon 11/1996   Brett Franklin  . Anxiety   . BPH (benign prostatic hyperplasia)   . Cervical spinal stenosis   . CVA (cerebral vascular accident) ([place])   . Diabetes mellitus, type 2 ([place])   . GERD (gastroesophageal reflux disease)   . Hemorrhoids   . Hypertension   . IBS (irritable bowel syndrome)   . Melanoma ([place])   . Nephrolithiasis     X 4   Past Surgical History:  Procedure Laterality Date  . CATARACT EXTRACTION Left   . CHOLECYSTECTOMY    . COLONOSCOPY W/ POLYPECTOMY     Brett Franklin  . CYSTOSCOPY     Brett Franklin      Current Outpatient Medications  Medication Sig Dispense Refill  . apixaban (ELIQUIS) 5 MG  TABS tablet Take 1 tablet (5 mg total) by mouth 2 (two) times daily. 60 tablet 5  . blood glucose meter kit and supplies KIT Dispense based on patient and insurance preference. Use to text blood sugars daily. 1 each 0  . erythromycin ophthalmic ointment APPLY IN BOTH EYES HS PRN. USE NO MORE THAN 3 TIMES PER WEEK    . glucose blood test strip Use to test blood sugars once daily 100 each 3  . JANUMET 50-500 MG tablet TAKE 1 TABLET BY MOUTH DAILY 90 tablet 1  . levothyroxine (SYNTHROID) 25 MCG tablet Take 1 tablet (25 mcg total) by mouth daily before breakfast. 30 tablet 1  . LORazepam (ATIVAN) 0.5 MG tablet Take 1 tablet (0.5 mg total) by mouth every 12 (twelve) hours as needed for anxiety. 20 tablet 1  . Multiple Vitamins-Minerals (MACULAR VITAMIN BENEFIT PO) Take by mouth 2 (two) times daily.    . mupirocin ointment (BACTROBAN) 2 %     . neomycin-polymyxin b-dexamethasone (MAXITROL) 3.5-10000-0.1 OINT APPLY IN BOTH EYES AT BEDTIME UTD . USE LESS THAN 3 TIMES WEEKLY  4  . pravastatin (PRAVACHOL) 20 MG tablet TAKE 1 TABLET(20 MG) BY MOUTH DAILY 90 tablet 0  . valACYclovir (VALTREX) 1000 MG tablet TK 2 TS PO BID FOR 1 DAY FOR FEVER BLISTER  3  . diltiazem (CARDIZEM CD) 180 MG 24 hr capsule  Take 1 capsule (180 mg total) by mouth daily. 30 capsule 3   No current facility-administered medications for this visit.     Allergies:   Lisinopril   Social History:  The patient  reports that he has never smoked. He has never used smokeless tobacco. He reports that he does not drink alcohol or use drugs.   Family History:  The patient's family history includes Alcohol abuse in his father; Diabetes type I in an other family member; Heart attack (age of onset: 42) in his maternal grandfather; Heart disease in his father.    ROS:  Please see the history of present illness.   Otherwise, review of systems is positive for none.   All other systems are reviewed and negative.    PHYSICAL EXAM: VS:  BP 122/80    Pulse (!) 127   Ht 5' 10" (1.778 m)   Wt 169 lb (76.7 kg)   BMI 24.25 kg/m  , BMI Body mass index is 24.25 kg/m. GEN: Well nourished, well developed, in no acute distress  HEENT: normal  Neck: no JVD, carotid bruits, or masses Cardiac: Irregular, tachycardic; no murmurs, rubs, or gallops,no edema  Respiratory:  clear to auscultation bilaterally, normal work of breathing GI: soft, nontender, nondistended, + BS MS: no deformity or atrophy  Skin: warm and dry Neuro:  Strength and sensation are intact Psych: euthymic mood, full affect  EKG:  EKG is not ordered today. Personal review of the ekg ordered shows atrial flutter  Recent Labs: 03/18/2019: ALT 9; BUN 26; Creatinine, Ser 1.13; Hemoglobin 15.4; Platelets 191.0; Potassium 4.5; Sodium 138; TSH 5.53    Lipid Panel     Component Value Date/Time   CHOL 107 07/08/2018 0927   TRIG 40.0 07/08/2018 0927   TRIG 36 09/04/2006 0915   HDL 66.40 07/08/2018 0927   CHOLHDL 2 07/08/2018 0927   VLDL 8.0 07/08/2018 0927   LDLCALC 33 07/08/2018 0927     Wt Readings from Last 3 Encounters:  03/24/19 169 lb (76.7 kg)  03/18/19 170 lb (77.1 kg)  02/17/19 169 lb (76.7 kg)      Other studies Reviewed: Additional studies/ records that were reviewed today include: PCP records  ASSESSMENT AND Franklin:  1.  Persistent atrial fibrillation/flutter: His heart rates have been fast into the 140s at times.  His metoprolol was increased, but he has had low blood pressures and thus I am concerned with increasing it anymore.  Brett Franklin thus stop his metoprolol and put him on 180 mg of diltiazem for now.  I do think his blood pressure should tolerate this dose.  He is having some weakness and fatigue.  Brett Franklin thus Franklin for cardioversion.  He was put on Eliquis by his primary physician last Friday.  Brett Franklin wait for 3 weeks.  Brett Franklin have him follow-up in A. fib clinic after cardioversion.  2.  Hypertension: Blood pressure has been well controlled.   Planning to stop metoprolol and start diltiazem.    Current medicines are reviewed at length with the patient today.   The patient does not have concerns regarding his medicines.  The following changes were made today: Stop metoprolol, start diltiazem  Labs/ tests ordered today include:  Orders Placed This Encounter  Procedures  . EKG 12-Lead     Disposition:   FU with Sahily Biddle 3 months  Signed, Tamarick Kovalcik Meredith Leeds, MD  03/24/2019 5:00 PM     [address]  [place] [address] [phone] (office) [phone] (fax)

## 2019-03-24 NOTE — Patient Instructions (Addendum)
Medication Instructions:  Your physician has recommended you make the following change in your medication:  1.STOP Lopressor (Metoprolol tartrate) 2. START Diltiazem 180 mg once daily  * If you need a refill on your cardiac medications before your next appointment, please call your pharmacy.   Labwork: None ordered  Testing/Procedures: Your physician has recommended that you have a Cardioversion (DCCV). Electrical Cardioversion uses a jolt of electricity to your heart either through paddles or wired patches attached to your chest. This is a controlled, usually prescheduled, procedure. Defibrillation is done under light anesthesia in the hospital, and you usually go home the day of the procedure. This is done to get your heart back into a normal rhythm. You are not awake for the procedure. Nurse will call you to arrange this procedure 3/more weeks from now.  Follow-Up: Your physician recommends that you schedule a follow-up appointment in: 1 month, after cardioversion, with the AFib clinic.  Your physician recommends that you schedule a follow-up appointment in: 3 months with Dr. Curt Bears.   Thank you for choosing [facility]!!   Trinidad Curet, RN [phone]  Any Other Special Instructions Will Be Listed Below (If Applicable).    CARDIOVERSION INSTRUCTIONS You are scheduled for a cardioversion on ________ at _______ with Dr. _________ or associates. Please go to [facility] [place] Stay at ________.  Enter through the [place] not have any food or drink after midnight on _______.  You may take your medicines with a sip of water on the day of your procedure.  You will need someone to drive you home following your procedure.   Call the [place] office at [phone] if you have any questions, problems or concerns.      Electrical Cardioversion Electrical cardioversion is the delivery of a jolt of electricity to  change the rhythm of the heart. Sticky patches or metal paddles are placed on the chest to deliver the electricity from a device. This is done to restore a normal rhythm. A rhythm that is too fast or not regular keeps the heart from pumping well. Electrical cardioversion is done in an emergency if:   There is low or no blood pressure as a result of the heart rhythm.    Normal rhythm must be restored as fast as possible to protect the brain and heart from further damage.    It may save a life. Cardioversion may be done for heart rhythms that are not immediately life threatening, such as atrial fibrillation or flutter, in which:   The heart is beating too fast or is not regular.    Medicine to change the rhythm has not worked.    It is safe to wait in order to allow time for preparation.  Symptoms of the abnormal rhythm are bothersome.  The risk of stroke and other serious problems can be reduced.  LET [facility] CARE PROVIDER KNOW ABOUT:   Any allergies you have.  All medicines you are taking, including vitamins, herbs, eye drops, creams, and over-the-counter medicines.  Previous problems you or members of your family have had with the use of anesthetics.    Any blood disorders you have.    Previous surgeries you have had.    Medical conditions you have.   RISKS AND COMPLICATIONS  Generally, this is a safe procedure. However, problems can occur and include:   Breathing problems related to the anesthetic used.  A blood clot that breaks  free and travels to other parts of your body. This could cause a stroke or other problems. The risk of this is lowered by use of blood-thinning medicine (anticoagulant) prior to the procedure.  Cardiac arrest (rare).   BEFORE THE PROCEDURE   You may have tests to detect blood clots in your heart and to evaluate heart function.   You may start taking anticoagulants so your blood does not clot as easily.    Medicines may be given to help  stabilize your heart rate and rhythm.   PROCEDURE  You will be given medicine through an IV tube to reduce discomfort and make you sleepy (sedative).    An electrical shock will be delivered.   AFTER THE PROCEDURE Your heart rhythm will be watched to make sure it does not change. You will need someone to drive you home.

## 2019-03-24 NOTE — H&P (View-Only) (Signed)
 Electrophysiology Office Note   Date:  03/24/2019   ID:  Brett Franklin, DOB 01/05/1926, MRN [id number]  PCP:  Burns, Stacy J, MD  Cardiologist:   Primary Electrophysiologist:  Brett Cozort Martin Ohn Bostic, MD    No chief complaint on file.    History of Present Illness: Brett Franklin is a 85 y.o. male who is being seen today for the evaluation of AF/flutter at the request of Burns, Stacy J, MD. Presenting today for electrophysiology evaluation.  He has a history of hypertension.  He presented to his primary physician last Friday after finding that his heart rate was fast and his blood pressure was low on his home blood pressure cuff.  His heart rates reportedly were in the 140s.  His primary physician found him to be in atrial fibrillation.  He did have weakness and fatigue at that time.  His metoprolol dose was increased to 25 mg twice a day.  He comes into clinic today reporting mild levels of fatigue.  He is tachycardic with heart rates in the 120s.  Today, he denies symptoms of palpitations, chest pain, shortness of breath, orthopnea, PND, lower extremity edema, claudication, dizziness, presyncope, syncope, bleeding, or neurologic sequela. The patient is tolerating medications without difficulties.    Past Medical History:  Diagnosis Date  . Adenomatous polyp of colon 11/1996   Brett Franklin  . Anxiety   . BPH (benign prostatic hyperplasia)   . Cervical spinal stenosis   . CVA (cerebral vascular accident) (HCC)   . Diabetes mellitus, type 2 (HCC)   . GERD (gastroesophageal reflux disease)   . Hemorrhoids   . Hypertension   . IBS (irritable bowel syndrome)   . Melanoma (HCC)   . Nephrolithiasis     X 4   Past Surgical History:  Procedure Laterality Date  . CATARACT EXTRACTION Left   . CHOLECYSTECTOMY    . COLONOSCOPY W/ POLYPECTOMY     Brett.Stark  . CYSTOSCOPY     Brett Franklin      Current Outpatient Medications  Medication Sig Dispense Refill  . apixaban (ELIQUIS) 5 MG  TABS tablet Take 1 tablet (5 mg total) by mouth 2 (two) times daily. 60 tablet 5  . blood glucose meter kit and supplies KIT Dispense based on patient and insurance preference. Use to text blood sugars daily. 1 each 0  . erythromycin ophthalmic ointment APPLY IN BOTH EYES HS PRN. USE NO MORE THAN 3 TIMES Brett WEEK    . glucose blood test strip Use to test blood sugars once daily 100 each 3  . JANUMET 50-500 MG tablet TAKE 1 TABLET BY MOUTH DAILY 90 tablet 1  . levothyroxine (SYNTHROID) 25 MCG tablet Take 1 tablet (25 mcg total) by mouth daily before breakfast. 30 tablet 1  . LORazepam (ATIVAN) 0.5 MG tablet Take 1 tablet (0.5 mg total) by mouth every 12 (twelve) hours as needed for anxiety. 20 tablet 1  . Multiple Vitamins-Minerals (MACULAR VITAMIN BENEFIT PO) Take by mouth 2 (two) times daily.    . mupirocin ointment (BACTROBAN) 2 %     . neomycin-polymyxin b-dexamethasone (MAXITROL) 3.5-10000-0.1 OINT APPLY IN BOTH EYES AT BEDTIME UTD . USE LESS THAN 3 TIMES WEEKLY  4  . pravastatin (PRAVACHOL) 20 MG tablet TAKE 1 TABLET(20 MG) BY MOUTH DAILY 90 tablet 0  . valACYclovir (VALTREX) 1000 MG tablet TK 2 TS PO BID FOR 1 DAY FOR FEVER BLISTER  3  . diltiazem (CARDIZEM CD) 180 MG 24 hr capsule   Take 1 capsule (180 mg total) by mouth daily. 30 capsule 3   No current facility-administered medications for this visit.     Allergies:   Lisinopril   Social History:  The patient  reports that he has never smoked. He has never used smokeless tobacco. He reports that he does not drink alcohol or use drugs.   Family History:  The patient's family history includes Alcohol abuse in his father; Diabetes type I in an other family member; Heart attack (age of onset: 42) in his maternal grandfather; Heart disease in his father.    ROS:  Please see the history of present illness.   Otherwise, review of systems is positive for none.   All other systems are reviewed and negative.    PHYSICAL EXAM: VS:  BP 122/80    Pulse (!) 127   Ht 5' 10" (1.778 m)   Wt 169 lb (76.7 kg)   BMI 24.25 kg/m  , BMI Body mass index is 24.25 kg/m. GEN: Well nourished, well developed, in no acute distress  HEENT: normal  Neck: no JVD, carotid bruits, or masses Cardiac: Irregular, tachycardic; no murmurs, rubs, or gallops,no edema  Respiratory:  clear to auscultation bilaterally, normal work of breathing GI: soft, nontender, nondistended, + BS MS: no deformity or atrophy  Skin: warm and dry Neuro:  Strength and sensation are intact Psych: euthymic mood, full affect  EKG:  EKG is not ordered today. Personal review of the ekg ordered shows atrial flutter  Recent Labs: 03/18/2019: ALT 9; BUN 26; Creatinine, Ser 1.13; Hemoglobin 15.4; Platelets 191.0; Potassium 4.5; Sodium 138; TSH 5.53    Lipid Panel     Component Value Date/Time   CHOL 107 07/08/2018 0927   TRIG 40.0 07/08/2018 0927   TRIG 36 09/04/2006 0915   HDL 66.40 07/08/2018 0927   CHOLHDL 2 07/08/2018 0927   VLDL 8.0 07/08/2018 0927   LDLCALC 33 07/08/2018 0927     Wt Readings from Last 3 Encounters:  03/24/19 169 lb (76.7 kg)  03/18/19 170 lb (77.1 kg)  02/17/19 169 lb (76.7 kg)      Other studies Reviewed: Additional studies/ records that were reviewed today include: PCP records  ASSESSMENT AND PLAN:  1.  Persistent atrial fibrillation/flutter: His heart rates have been fast into the 140s at times.  His metoprolol was increased, but he has had low blood pressures and thus I am concerned with increasing it anymore.  We [name] thus stop his metoprolol and put him on 180 mg of diltiazem for now.  I do think his blood pressure should tolerate this dose.  He is having some weakness and fatigue.  We [name] thus plan for cardioversion.  He was put on Eliquis by his primary physician last Friday.  We [name] wait for 3 weeks.  We [name] have him follow-up in A. fib clinic after cardioversion.  2.  Hypertension: Blood pressure has been well controlled.   Planning to stop metoprolol and start diltiazem.    Current medicines are reviewed at length with the patient today.   The patient does not have concerns regarding his medicines.  The following changes were made today: Stop metoprolol, start diltiazem  Labs/ tests ordered today include:  Orders Placed This Encounter  Procedures  . EKG 12-Lead     Disposition:   FU with Anitha Kreiser 3 months  Signed, Sanaai Doane Meredith Leeds, MD  03/24/2019 5:00 PM     [address]  [place] [address] [phone] (office) [phone] (fax)  

## 2019-03-29 ENCOUNTER — Telehealth: Payer: Self-pay

## 2019-03-29 NOTE — Telephone Encounter (Signed)
  Called pts wife and discussed the following pre DCCV instructions. Pt has had recent lab work, so he will only need COVID screening. She has repeated and verbalized understanding. She will call with any additional questions.    You have been scheduled for a Cardioversion with Dr Harrell Gave on July 15th at 9:30am. Please arrive at patient registration by 8:30am that morning. Please bring a copy of your insurance information with you.  You will not able to bring a visitor with you due to current COVID-19 restrictions.   You will not be able to eat or drink after midnight the night before your procedure.   On the morning of your procedure, you may take all your normal medications except for Janumet. Please wait to take that until you are able to eat later that day.  Do not skip a dose of Eliquis. You will remain on Eliquis after your cardioversion until directed otherwise.   On July 13 at 10:50am, please report to [facility], [address] for your COVID screening. You will be asked to self quarantine from the time of your screening until the time of your procedure. Please do not allow outside visitors or visit others during this time. If you must leave your home, please use a mask and practice social distancing.

## 2019-03-29 NOTE — Telephone Encounter (Signed)
-----   Message from Stanton Kidney, RN sent at 03/29/2019  8:46 AM EDT ----- Regarding: DCCV Pt seen 6/25 virtual visit w/ [facility] AF[facility] Pt started Eliquis 6/19.   Needs DCCV week of July 13th.

## 2019-04-11 ENCOUNTER — Other Ambulatory Visit (HOSPITAL_COMMUNITY)
Admission: RE | Admit: 2019-04-11 | Discharge: 2019-04-11 | Disposition: A | Discharge status: Home / Self Care | Payer: Medicare Other | Source: Ambulatory Visit | Attending: Cardiology | Admitting: Cardiology

## 2019-04-11 DIAGNOSIS — Z1159 Encounter for screening for other viral diseases: Secondary | ICD-10-CM | POA: Diagnosis present

## 2019-04-12 ENCOUNTER — Other Ambulatory Visit: Payer: Self-pay | Admitting: Internal Medicine

## 2019-04-12 LAB — SARS CORONAVIRUS 2 (TAT 6-24 HRS): SARS Coronavirus 2: NEGATIVE

## 2019-04-13 ENCOUNTER — Encounter (HOSPITAL_COMMUNITY): Payer: Self-pay

## 2019-04-13 ENCOUNTER — Ambulatory Visit (HOSPITAL_COMMUNITY): Payer: Medicare Other | Admitting: Certified Registered Nurse Anesthetist

## 2019-04-13 ENCOUNTER — Encounter (HOSPITAL_COMMUNITY)
Admission: RE | Disposition: A | Discharge status: Home / Self Care | Payer: Self-pay | Source: Home / Self Care | Attending: Cardiology

## 2019-04-13 ENCOUNTER — Ambulatory Visit (HOSPITAL_COMMUNITY)
Admission: RE | Admit: 2019-04-13 | Discharge: 2019-04-13 | Disposition: A | Discharge status: Home / Self Care | Payer: Medicare Other | Attending: Cardiology | Admitting: Cardiology

## 2019-04-13 DIAGNOSIS — Z79890 Hormone replacement therapy (postmenopausal): Secondary | ICD-10-CM | POA: Diagnosis not present

## 2019-04-13 DIAGNOSIS — I480 Paroxysmal atrial fibrillation: Secondary | ICD-10-CM

## 2019-04-13 DIAGNOSIS — K219 Gastro-esophageal reflux disease without esophagitis: Secondary | ICD-10-CM | POA: Diagnosis not present

## 2019-04-13 DIAGNOSIS — N400 Enlarged prostate without lower urinary tract symptoms: Secondary | ICD-10-CM | POA: Diagnosis not present

## 2019-04-13 DIAGNOSIS — I4892 Unspecified atrial flutter: Secondary | ICD-10-CM | POA: Diagnosis not present

## 2019-04-13 DIAGNOSIS — E119 Type 2 diabetes mellitus without complications: Secondary | ICD-10-CM | POA: Diagnosis not present

## 2019-04-13 DIAGNOSIS — I10 Essential (primary) hypertension: Secondary | ICD-10-CM | POA: Insufficient documentation

## 2019-04-13 DIAGNOSIS — Z79899 Other long term (current) drug therapy: Secondary | ICD-10-CM | POA: Diagnosis not present

## 2019-04-13 DIAGNOSIS — K589 Irritable bowel syndrome without diarrhea: Secondary | ICD-10-CM | POA: Insufficient documentation

## 2019-04-13 DIAGNOSIS — Z888 Allergy status to other drugs, medicaments and biological substances status: Secondary | ICD-10-CM | POA: Diagnosis not present

## 2019-04-13 DIAGNOSIS — Z8673 Personal history of transient ischemic attack (TIA), and cerebral infarction without residual deficits: Secondary | ICD-10-CM | POA: Insufficient documentation

## 2019-04-13 DIAGNOSIS — Z85820 Personal history of malignant melanoma of skin: Secondary | ICD-10-CM | POA: Insufficient documentation

## 2019-04-13 DIAGNOSIS — Z7901 Long term (current) use of anticoagulants: Secondary | ICD-10-CM | POA: Diagnosis not present

## 2019-04-13 DIAGNOSIS — F419 Anxiety disorder, unspecified: Secondary | ICD-10-CM | POA: Diagnosis not present

## 2019-04-13 DIAGNOSIS — I4819 Other persistent atrial fibrillation: Secondary | ICD-10-CM | POA: Insufficient documentation

## 2019-04-13 DIAGNOSIS — Z7984 Long term (current) use of oral hypoglycemic drugs: Secondary | ICD-10-CM | POA: Diagnosis not present

## 2019-04-13 HISTORY — PX: CARDIOVERSION: SHX1299

## 2019-04-13 LAB — POCT I-STAT, CHEM 8
BUN: 23 mg/dL (ref 8–23)
Calcium, Ion: 1.2 mmol/L (ref 1.15–1.40)
Chloride: 102 mmol/L (ref 98–111)
Creatinine, Ser: 1 mg/dL (ref 0.61–1.24)
Glucose, Bld: 189 mg/dL — ABNORMAL HIGH (ref 70–99)
HCT: 46 % (ref 39.0–52.0)
Hemoglobin: 15.6 g/dL (ref 13.0–17.0)
Potassium: 4 mmol/L (ref 3.5–5.1)
Sodium: 140 mmol/L (ref 135–145)
TCO2: 26 mmol/L (ref 22–32)

## 2019-04-13 SURGERY — CARDIOVERSION
Anesthesia: General

## 2019-04-13 MED ORDER — LIDOCAINE 2% (20 MG/ML) 5 ML SYRINGE
INTRAMUSCULAR | Status: DC | PRN
  Administered 2019-04-13: 60 mg via INTRAVENOUS

## 2019-04-13 MED ORDER — SODIUM CHLORIDE 0.9 % IV SOLN
INTRAVENOUS | Status: DC
  Administered 2019-04-13: 09:00:00 via INTRAVENOUS

## 2019-04-13 MED ORDER — PROPOFOL 10 MG/ML IV BOLUS
INTRAVENOUS | Status: DC | PRN
  Administered 2019-04-13: 60 mg via INTRAVENOUS
  Administered 2019-04-13: 20 mg via INTRAVENOUS

## 2019-04-13 NOTE — CV Procedure (Signed)
Procedure:   DCCV  Indication:  Symptomatic atrial fibrillation and flutter  Procedure Note:  The patient signed informed consent.  They have had had therapeutic anticoagulation with apixaban greater than 3 weeks.  Anesthesia was administered by Dr. Marcie Bal. He received 60 mg lidocaine and 60 mg propofol.  Adequate airway was maintained throughout and vital followed per protocol.  They were cardioverted x 1 with 120J of biphasic synchronized energy.  They converted to NSR, though did have frequent PACs and PVCs.  There were no apparent complications.  The patient had normal neuro status and respiratory status post procedure with vitals stable as recorded elsewhere.    Follow up:  They will continue on current medical therapy and follow up with cardiology as scheduled.  Buford Dresser, MD PhD 04/13/2019 9:38 AM

## 2019-04-13 NOTE — Anesthesia Postprocedure Evaluation (Signed)
Anesthesia Post Note  Patient: Brett Franklin  Procedure(s) Performed: CARDIOVERSION (N/A )     Patient location during evaluation: Endoscopy Anesthesia Type: General Level of consciousness: awake and alert Pain management: pain level controlled Vital Signs Assessment: post-procedure vital signs reviewed and stable Respiratory status: spontaneous breathing, nonlabored ventilation, respiratory function stable and patient connected to nasal cannula oxygen Cardiovascular status: blood pressure returned to baseline and stable Postop Assessment: no apparent nausea or vomiting Anesthetic complications: no    Last Vitals:  Vitals:   04/13/19 0839 04/13/19 0939  BP: (!) 147/58 (!) 107/51  Pulse: 83   Resp: 17 16  Temp: 36.9 C 36.5 C  SpO2: 99% 98%    Last Pain:  Vitals:   04/13/19 0939  TempSrc: Temporal  PainSc: 0-No pain                 [name] S

## 2019-04-13 NOTE — Anesthesia Preprocedure Evaluation (Signed)
Anesthesia Evaluation  Patient identified by MRN, date of birth, ID band Patient awake    Reviewed: Allergy & Precautions, H&P , NPO status , Patient's Chart, lab work & pertinent test results  Airway Mallampati: II   Neck ROM: full    Dental   Pulmonary neg pulmonary ROS,    breath sounds clear to auscultation       Cardiovascular hypertension, + dysrhythmias Atrial Fibrillation  Rhythm:irregular Rate:Normal     Neuro/Psych PSYCHIATRIC DISORDERS Anxiety  Neuromuscular disease CVA    GI/Hepatic GERD  ,  Endo/Other  diabetes, Type 2  Renal/GU stones     Musculoskeletal   Abdominal   Peds  Hematology   Anesthesia Other Findings   Reproductive/Obstetrics                             Anesthesia Physical Anesthesia Plan  ASA: III  Anesthesia Plan: General   Post-op Pain Management:    Induction: Intravenous  PONV Risk Score and Plan: 2 and Treatment may vary due to age or medical condition and Propofol infusion  Airway Management Planned: Mask  Additional Equipment:   Intra-op Plan:   Post-operative Plan:   Informed Consent: I have reviewed the patients History and Physical, chart, labs and discussed the procedure including the risks, benefits and alternatives for the proposed anesthesia with the patient or authorized representative who has indicated his/her understanding and acceptance.       Plan Discussed with: CRNA, Anesthesiologist and Surgeon  Anesthesia Plan Comments:         Anesthesia Quick Evaluation

## 2019-04-13 NOTE — Transfer of Care (Addendum)
Immediate Anesthesia Transfer of Care Note  Patient: Brett Franklin  Procedure(s) Performed: CARDIOVERSION (N/A )  Patient Location: Endoscopy Unit  Anesthesia Type:MAC  Level of Consciousness: drowsy  Airway & Oxygen Therapy: Patient Spontanous Breathing  Post-op Assessment: Report given to RN and Post -op Vital signs reviewed and stable  Post vital signs: Reviewed and stable  Last Vitals:  Vitals Value Taken Time  BP 108/57 (76)   Temp    Pulse 64   Resp 14   SpO2 100     Last Pain:  Vitals:   04/13/19 0839  TempSrc: Oral  PainSc: 0-No pain         Complications: No apparent anesthesia complications

## 2019-04-13 NOTE — Interval H&P Note (Signed)
History and Physical Interval Note:  04/13/2019 9:07 AM  Brett Franklin  has presented today for surgery, with the diagnosis of ATRIAL FIBRILLATION.  The various methods of treatment have been discussed with the patient and family. After consideration of risks, benefits and other options for treatment, the patient has consented to  Procedure(s): CARDIOVERSION (N/A) as a surgical intervention.  The patient's history has been reviewed, patient examined, no change in status, stable for surgery.  I have reviewed the patient's chart and labs.  Questions were answered to the patient's satisfaction.     Lathon Adan Harrell Gave

## 2019-04-13 NOTE — Discharge Instructions (Signed)

## 2019-04-14 ENCOUNTER — Encounter (HOSPITAL_COMMUNITY): Payer: Self-pay | Admitting: Cardiology

## 2019-04-15 DIAGNOSIS — B369 Superficial mycosis, unspecified: Secondary | ICD-10-CM | POA: Insufficient documentation

## 2019-04-15 DIAGNOSIS — H6240 Otitis externa in other diseases classified elsewhere, unspecified ear: Secondary | ICD-10-CM | POA: Insufficient documentation

## 2019-04-28 ENCOUNTER — Ambulatory Visit: Payer: Medicare Other | Admitting: Internal Medicine

## 2019-05-06 ENCOUNTER — Encounter: Payer: Self-pay | Admitting: Internal Medicine

## 2019-05-06 ENCOUNTER — Ambulatory Visit: Payer: Self-pay

## 2019-05-06 ENCOUNTER — Ambulatory Visit (INDEPENDENT_AMBULATORY_CARE_PROVIDER_SITE_OTHER): Payer: Medicare Other | Admitting: Internal Medicine

## 2019-05-06 ENCOUNTER — Other Ambulatory Visit: Payer: Self-pay

## 2019-05-06 VITALS — BP 170/82 | HR 64 | Temp 97.6°F | Resp 16 | Ht 70.0 in | Wt 167.0 lb

## 2019-05-06 DIAGNOSIS — R42 Dizziness and giddiness: Secondary | ICD-10-CM | POA: Insufficient documentation

## 2019-05-06 DIAGNOSIS — I10 Essential (primary) hypertension: Secondary | ICD-10-CM

## 2019-05-06 MED ORDER — DILTIAZEM HCL ER COATED BEADS 120 MG PO CP24: 120.0000 mg | ORAL_CAPSULE | Freq: Every day | ORAL | 1 refills | Status: DC

## 2019-05-06 NOTE — Patient Instructions (Addendum)
BP is on the low side.  This is likely what caused your symptoms yesterday.     Stop the diltiazem 180 mg daily and start  Diltiazem 120 mg daily ( a new prescription was sent to [facility]).    Continue to monitor your BP at home.

## 2019-05-06 NOTE — Progress Notes (Signed)
Subjective:    Patient ID: Brett Franklin, male    DOB: 06-Aug-1926, 85 y.o.   MRN: [id number]  HPI The patient is here for an acute visit for low blood pressure.   Hypotension:  He is taking diltiazem daily.  That is the only medication he takes for his BP.  He has been on this medication since his cardioversion 7/15.  He has not had any palpitations.   He walks twice a day.  Yesterday afternoon he was walking and was almost home - he felt funny - like he was going to pass out.  He had no chest pain or palpitations.  When he got home his BP was 109/47 and HR 66.  Yesterday afternoon he felt good.  He has not had any lightheadedness or feeling like he was going to pass out since then.  His wife states his blood pressures been 20-100F systolically and he has had episodes of dizziness, lightheadedness and weakness.  He has a cardiology appointment next week.   Medications and allergies reviewed with patient and updated if appropriate.  Patient Active Problem List   Diagnosis Date Noted  . Atrial flutter ([place])   . Paroxysmal atrial fibrillation (HCC)   . New onset a-fib ([place]) 03/18/2019  . ETD (eustachian tube dysfunction) 02/17/2019  . Acute otitis externa of right ear 11/09/2018  . Poor balance 10/08/2018  . Right foot pain 01/13/2017  . Elevated TSH 05/03/2015  . Hyperlipidemia 07/21/2011  . OTHER DYSPHAGIA 03/04/2010  . History of stroke 12/31/2009  . LOW BACK PAIN SYNDROME 11/12/2009  . CERVICAL RADICULOPATHY, LEFT 05/11/2008  . NEPHROLITHIASIS, HX OF 05/11/2008  . Diabetes mellitus with neurological manifestations, controlled ([place]) 11/16/2007  . Anxiety 07/08/2007  . Melanoma of skin ([place]) 01/21/2007  . Essential hypertension 01/21/2007  . IBS 01/21/2007  . BPH (benign prostatic hyperplasia) 01/21/2007  . STENOSIS, CERVICAL SPINAL 01/21/2007  . History of adenomatous polyp of colon 01/21/2007    Current Outpatient Medications on File Prior to Visit  Medication Sig  Dispense Refill  . acetaminophen (TYLENOL) 650 MG CR tablet Take 650 mg by mouth every 8 (eight) hours as needed for pain.    Marland Kitchen apixaban (ELIQUIS) 5 MG TABS tablet Take 1 tablet (5 mg total) by mouth 2 (two) times daily. 60 tablet 5  . blood glucose meter kit and supplies KIT Dispense based on patient and insurance preference. Use to text blood sugars daily. 1 each 0  . clotrimazole ([employer] CLOTRIMAZOLE) 1 % external solution Instill 3 drops into the right ear twice daily    . diltiazem (CARDIZEM CD) 180 MG 24 hr capsule Take 1 capsule (180 mg total) by mouth daily. 30 capsule 3  . glucose blood test strip Use to test blood sugars once daily 100 each 3  . JANUMET 50-500 MG tablet TAKE 1 TABLET BY MOUTH DAILY (Patient taking differently: Take 1 tablet by mouth daily. ) 90 tablet 1  . levothyroxine (SYNTHROID) 25 MCG tablet TAKE 1 TABLET(25 MCG) BY MOUTH DAILY BEFORE BREAKFAST 90 tablet 10  . LORazepam (ATIVAN) 0.5 MG tablet Take 1 tablet (0.5 mg total) by mouth every 12 (twelve) hours as needed for anxiety. (Patient taking differently: Take 0.25-0.5 mg by mouth every 12 (twelve) hours as needed for anxiety. ) 20 tablet 1  . Multiple Vitamins-Minerals (MACULAR VITAMIN BENEFIT PO) Take 1 tablet by mouth 2 (two) times daily.     . mupirocin ointment (BACTROBAN) 2 % Apply 1 application topically  daily as needed (wound care).     . polyethylene glycol (MIRALAX / GLYCOLAX) 17 g packet Take 17 g by mouth daily.    . pravastatin (PRAVACHOL) 20 MG tablet TAKE 1 TABLET(20 MG) BY MOUTH DAILY (Patient taking differently: Take 20 mg by mouth daily. ) 90 tablet 0  . [DISCONTINUED] metoprolol tartrate (LOPRESSOR) 25 MG tablet Take 1 tablet (25 mg total) by mouth 2 (two) times daily. 180 tablet 1   No current facility-administered medications on file prior to visit.     Past Medical History:  Diagnosis Date  . Adenomatous polyp of colon 11/1996   Dr Fuller Plan  . Anxiety   . BPH (benign prostatic hyperplasia)   .  Cervical spinal stenosis   . CVA (cerebral vascular accident) ([place])   . Diabetes mellitus, type 2 ([place])   . GERD (gastroesophageal reflux disease)   . Hemorrhoids   . Hypertension   . IBS (irritable bowel syndrome)   . Melanoma ([place])   . Nephrolithiasis     X 4    Past Surgical History:  Procedure Laterality Date  . CARDIOVERSION N/A 04/13/2019   Procedure: CARDIOVERSION;  Surgeon: Buford Dresser, MD;  Location: [facility] ENDOSCOPY;  Service: Cardiovascular;  Laterality: N/A;  . CATARACT EXTRACTION Left   . CHOLECYSTECTOMY    . COLONOSCOPY W/ POLYPECTOMY     Dr.Stark  . CYSTOSCOPY     Dr.Kimbrough     Social History   Socioeconomic History  . Marital status: Married    Spouse name: Not on file  . Number of children: 2  . Years of education: Not on file  . Highest education level: Not on file  Occupational History  . Occupation: retired    Fish farm manager: RETIRED  Social Needs  . Financial resource strain: Not hard at all  . Food insecurity    Worry: Never true    Inability: Never true  . Transportation needs    Medical: No    Non-medical: No  Tobacco Use  . Smoking status: Never Smoker  . Smokeless tobacco: Never Used  Substance and Sexual Activity  . Alcohol use: No  . Drug use: No  . Sexual activity: Not Currently  Lifestyle  . Physical activity    Days per week: 6 days    Minutes per session: 30 min  . Stress: Not at all  Relationships  . Social connections    Talks on phone: More than three times a week    Gets together: More than three times a week    Attends religious service: More than 4 times per year    Active member of club or organization: Yes    Attends meetings of clubs or organizations: More than 4 times per year    Relationship status: Married  Other Topics Concern  . Not on file  Social History Narrative  . Not on file    Family History  Problem Relation Age of Onset  . Alcohol abuse Father   . Heart disease Father   . Heart attack  Maternal Grandfather 65  . Diabetes type I Other        grandson  . Cancer Neg Hx   . Stroke Neg Hx     Review of Systems  Constitutional: Negative for chills and fever.  Cardiovascular: Negative for chest pain, palpitations and leg swelling.  Gastrointestinal: Negative for blood in stool.  Genitourinary: Negative for hematuria.  Neurological: Positive for light-headedness. Negative for headaches.  Objective:   Vitals:   05/06/19 1336  BP: (!) 170/82  Pulse: 64  Resp: 16  Temp: 97.6 F (36.4 C)  SpO2: 99%   BP Readings from Last 3 Encounters:  05/06/19 (!) 170/82  04/13/19 115/67  03/24/19 122/80   Wt Readings from Last 3 Encounters:  05/06/19 167 lb (75.8 kg)  04/13/19 160 lb (72.6 kg)  03/24/19 169 lb (76.7 kg)   Body mass index is 23.96 kg/m.   Physical Exam    Constitutional: Appears well-developed and well-nourished. No distress.  HENT:  Head: Normocephalic and atraumatic.  Neck: Neck supple. No tracheal deviation present. No thyromegaly present.  No cervical lymphadenopathy Cardiovascular: Normal rate, regular rhythm and normal heart sounds.   No murmur heard. No carotid bruit .  No edema Pulmonary/Chest: Effort normal and breath sounds normal. No respiratory distress. No has no wheezes. No rales.  Skin: Skin is warm and dry. Not diaphoretic.  Psychiatric: Normal mood and affect. Behavior is normal.       Assessment & Plan:    See Problem List for Assessment and Plan of chronic medical problems.

## 2019-05-06 NOTE — Assessment & Plan Note (Signed)
He had an episode of lightheadedness yesterday and what sounds like a presyncopal episode BP just after this episode was on the low side We will decrease Cardizem to 120 mg daily He will continue to monitor his blood pressure He sees cardiology next week

## 2019-05-06 NOTE — Assessment & Plan Note (Signed)
Blood pressure likely overcontrolled-yesterday had an episode of lightheadedness/weakness/presyncope and BP just after that was 109/47, heart rate 66 We will decrease the Cardizem to 120 mg daily He will continue to monitor his blood pressure at home Call with any other symptoms He has a follow-up with cardiology next week

## 2019-05-06 NOTE — Telephone Encounter (Signed)
Appointment with you at 1:45

## 2019-05-06 NOTE — Telephone Encounter (Signed)
Patient's wife called stating that her husbands BP is low. Today 130/53 HR 56.  She is worried about the "bottom number" stating that it has never been lower than 60's. She states that he had a weak spell yesterday where he thought he was going to pass out. Today he feels fine.  She states that about 3 weeks ago he had a cardioversion. His metoprolol was stopped but that is the only change to medication. Care advice read to wife.  She verbalized understanding. Call transferred to office for scheduling.  Reason for Disposition . [3] Systolic BP 83-818 AND [4] taking blood pressure medications AND [3] dizzy, lightheaded or weak  Answer Assessment - Initial Assessment Questions 1. BLOOD PRESSURE: "What is the blood pressure?" "Did you take at least two measurements 5 minutes apart?"     130/53, 56 :  2. ONSET: "When did you take your blood pressure?"     yesterday 3. HOW: "How did you obtain the blood pressure?" (e.g., visiting nurse, automatic home BP monitor)     home 4. HISTORY: "Do you have a history of low blood pressure?" "What is your blood pressure normally?"     130/60 5. MEDICATIONS: "Are you taking any medications for blood pressure?" If yes: "Have they been changed recently?"     metoprol stopped 3 weeks ago 6. PULSE RATE: "Do you know what your pulse rate is?"    56 7. OTHER SYMPTOMS: "Have you been sick recently?" "Have you had a recent injury?"    Weak spell yesterday 8. PREGNANCY: "Is there any chance you are pregnant?" "When was your last menstrual period?"    N/A  Protocols used: LOW BLOOD PRESSURE-A-AH

## 2019-05-13 ENCOUNTER — Ambulatory Visit (HOSPITAL_COMMUNITY)
Admission: RE | Admit: 2019-05-13 | Discharge: 2019-05-13 | Disposition: A | Discharge status: Home / Self Care | Payer: Medicare Other | Source: Ambulatory Visit | Attending: Physician Assistant | Admitting: Physician Assistant

## 2019-05-13 ENCOUNTER — Encounter (HOSPITAL_COMMUNITY): Payer: Self-pay | Admitting: Physician Assistant

## 2019-05-13 ENCOUNTER — Other Ambulatory Visit: Payer: Self-pay

## 2019-05-13 VITALS — BP 138/66 | HR 71 | Ht 70.0 in | Wt 165.0 lb

## 2019-05-13 DIAGNOSIS — M4802 Spinal stenosis, cervical region: Secondary | ICD-10-CM | POA: Insufficient documentation

## 2019-05-13 DIAGNOSIS — I4819 Other persistent atrial fibrillation: Secondary | ICD-10-CM | POA: Insufficient documentation

## 2019-05-13 DIAGNOSIS — Z833 Family history of diabetes mellitus: Secondary | ICD-10-CM | POA: Diagnosis not present

## 2019-05-13 DIAGNOSIS — Z8673 Personal history of transient ischemic attack (TIA), and cerebral infarction without residual deficits: Secondary | ICD-10-CM | POA: Insufficient documentation

## 2019-05-13 DIAGNOSIS — I480 Paroxysmal atrial fibrillation: Secondary | ICD-10-CM

## 2019-05-13 DIAGNOSIS — Z79890 Hormone replacement therapy (postmenopausal): Secondary | ICD-10-CM | POA: Insufficient documentation

## 2019-05-13 DIAGNOSIS — Z8249 Family history of ischemic heart disease and other diseases of the circulatory system: Secondary | ICD-10-CM | POA: Diagnosis not present

## 2019-05-13 DIAGNOSIS — E119 Type 2 diabetes mellitus without complications: Secondary | ICD-10-CM | POA: Insufficient documentation

## 2019-05-13 DIAGNOSIS — Z9049 Acquired absence of other specified parts of digestive tract: Secondary | ICD-10-CM | POA: Insufficient documentation

## 2019-05-13 DIAGNOSIS — I10 Essential (primary) hypertension: Secondary | ICD-10-CM | POA: Insufficient documentation

## 2019-05-13 DIAGNOSIS — Z7901 Long term (current) use of anticoagulants: Secondary | ICD-10-CM | POA: Diagnosis not present

## 2019-05-13 DIAGNOSIS — Z79899 Other long term (current) drug therapy: Secondary | ICD-10-CM | POA: Diagnosis not present

## 2019-05-13 DIAGNOSIS — I4892 Unspecified atrial flutter: Secondary | ICD-10-CM | POA: Diagnosis not present

## 2019-05-13 DIAGNOSIS — F419 Anxiety disorder, unspecified: Secondary | ICD-10-CM | POA: Insufficient documentation

## 2019-05-13 DIAGNOSIS — Z888 Allergy status to other drugs, medicaments and biological substances status: Secondary | ICD-10-CM | POA: Insufficient documentation

## 2019-05-13 NOTE — Progress Notes (Signed)
Primary Care Physician: Binnie Rail, MD Primary Electrophysiologist: Dr Curt Bears Referring Physician: Dr Jamey Ripa is a 85 y.o. male with a history of CVA, DM, HTN, and persistent atrial fibrillation and atrial flutter who presents for follow up in the [facility]. He presented to his primary physician after finding that his heart rate was fast and his blood pressure was low on his home blood pressure cuff.  His heart rates reportedly were in the 140s.  His primary physician found him to be in atrial fibrillation.  He did have weakness and fatigue at that time. He denies snoring or significant alcohol use. No specific triggers that the patient could identify.   On follow up today, patient is s/p DCCV on 04/13/19. He reports that he feels well with no further heart racing. He denies any missed doses of anticoagulation.  Today, he denies symptoms of palpitations, chest pain, shortness of breath, orthopnea, PND, lower extremity edema, dizziness, presyncope, syncope, snoring, daytime somnolence, bleeding, or neurologic sequela. The patient is tolerating medications without difficulties and is otherwise without complaint today.    Atrial Fibrillation Risk Factors:  he does not have symptoms or diagnosis of sleep apnea. he does not have a history of rheumatic fever. he does not have a history of alcohol use. The patient does not have a history of early familial atrial fibrillation or other arrhythmias.  he has a BMI of Body mass index is 23.68 kg/m.Marland Kitchen Filed Weights   05/13/19 1108  Weight: 74.8 kg    Family History  Problem Relation Age of Onset   Alcohol abuse Father    Heart disease Father    Heart attack Maternal Grandfather 65   Diabetes type I Other        grandson   Cancer Neg Hx    Stroke Neg Hx      Atrial Fibrillation Management history:  Previous antiarrhythmic drugs: none Previous cardioversions: 04/13/19 Previous  ablations: none CHADS2VASC score: 6 Anticoagulation history: Eliquis 5 mg BID   Past Medical History:  Diagnosis Date   Adenomatous polyp of colon 11/1996   Dr Fuller Plan   Anxiety    BPH (benign prostatic hyperplasia)    Cervical spinal stenosis    CVA (cerebral vascular accident) ([place])    Diabetes mellitus, type 2 ([place])    GERD (gastroesophageal reflux disease)    Hemorrhoids    Hypertension    IBS (irritable bowel syndrome)    Melanoma ([place])    Nephrolithiasis     X 4   Past Surgical History:  Procedure Laterality Date   CARDIOVERSION N/A 04/13/2019   Procedure: CARDIOVERSION;  Surgeon: Buford Dresser, MD;  Location: [facility] ENDOSCOPY;  Service: Cardiovascular;  Laterality: N/A;   CATARACT EXTRACTION Left    CHOLECYSTECTOMY     COLONOSCOPY W/ POLYPECTOMY     Dr.Stark   CYSTOSCOPY     Dr.Kimbrough     Current Outpatient Medications  Medication Sig Dispense Refill   acetaminophen (TYLENOL) 650 MG CR tablet Take 650 mg by mouth every 8 (eight) hours as needed for pain.     apixaban (ELIQUIS) 5 MG TABS tablet Take 1 tablet (5 mg total) by mouth 2 (two) times daily. 60 tablet 5   blood glucose meter kit and supplies KIT Dispense based on patient and insurance preference. Use to text blood sugars daily. 1 each 0   clotrimazole ([employer]S CLOTRIMAZOLE) 1 % external solution Instill 3 drops into the right  ear twice daily     diltiazem (CARDIZEM CD) 120 MG 24 hr capsule Take 1 capsule (120 mg total) by mouth daily. 90 capsule 1   glucose blood test strip Use to test blood sugars once daily 100 each 3   JANUMET 50-500 MG tablet TAKE 1 TABLET BY MOUTH DAILY (Patient taking differently: Take 1 tablet by mouth daily. ) 90 tablet 1   levothyroxine (SYNTHROID) 25 MCG tablet TAKE 1 TABLET(25 MCG) BY MOUTH DAILY BEFORE BREAKFAST 90 tablet 10   LORazepam (ATIVAN) 0.5 MG tablet Take 1 tablet (0.5 mg total) by mouth every 12 (twelve) hours as needed for anxiety. (Patient  taking differently: Take 0.25-0.5 mg by mouth every 12 (twelve) hours as needed for anxiety. ) 20 tablet 1   Multiple Vitamins-Minerals (MACULAR VITAMIN BENEFIT PO) Take 1 tablet by mouth 2 (two) times daily.      mupirocin ointment (BACTROBAN) 2 % Apply 1 application topically daily as needed (wound care).      polyethylene glycol (MIRALAX / GLYCOLAX) 17 g packet Take 17 g by mouth daily.     pravastatin (PRAVACHOL) 20 MG tablet TAKE 1 TABLET(20 MG) BY MOUTH DAILY 90 tablet 0   No current facility-administered medications for this encounter.     Allergies  Allergen Reactions   Lisinopril Cough    Hacking cough; he was changed to losartan    Social History   Socioeconomic History   Marital status: Married    Spouse name: Not on file   Number of children: 2   Years of education: Not on file   Highest education level: Not on file  Occupational History   Occupation: retired    Fish farm manager: RETIRED  Scientist, product/process development strain: Not hard at all   Food insecurity    Worry: Never true    Inability: Never true   Transportation needs    Medical: No    Non-medical: No  Tobacco Use   Smoking status: Never Smoker   Smokeless tobacco: Never Used  Substance and Sexual Activity   Alcohol use: No   Drug use: No   Sexual activity: Not Currently  Lifestyle   Physical activity    Days per week: 6 days    Minutes per session: 30 min   Stress: Not at all  Relationships   Social connections    Talks on phone: More than three times a week    Gets together: More than three times a week    Attends religious service: More than 4 times per year    Active member of club or organization: Yes    Attends meetings of clubs or organizations: More than 4 times per year    Relationship status: Married   Intimate partner violence    Fear of current or ex partner: No    Emotionally abused: No    Physically abused: No    Forced sexual activity: No  Other Topics  Concern   Not on file  Social History Narrative   Not on file     ROS- All systems are reviewed and negative except as per the HPI above.  Physical Exam: Vitals:   05/13/19 1108  BP: 138/66  Pulse: 71  Weight: 74.8 kg  Height: 5' 10"  (1.778 m)    GEN- The patient is well appearing elderly male, alert and oriented x 3 today.   Head- normocephalic, atraumatic Eyes-  Sclera clear, conjunctiva pink Ears- hearing intact Oropharynx- clear Neck- supple  Lungs- Clear to ausculation bilaterally, normal work of breathing Heart- Regular rate and rhythm, no murmurs, rubs or gallops  GI- soft, NT, ND, + BS Extremities- no clubbing, cyanosis, or edema MS- no significant deformity or atrophy Skin- no rash or lesion Psych- euthymic mood, full affect Neuro- strength and sensation are intact  Wt Readings from Last 3 Encounters:  05/13/19 74.8 kg  05/06/19 75.8 kg  04/13/19 72.6 kg    EKG today demonstrates SR HR 71, NST, PR 174, QRS 96, QTc 430  Epic records are reviewed at length today  Assessment and Plan:  1. Persistent atrial fibrillation Patient is s/p DCCV 04/13/19. Appears to be maintaining SR. General education about afib provided and questions answered.  Continue diltiazem 120 mg daily Continue Eliquis 5 mg BID  This patients CHA2DS2-VASc Score and unadjusted Ischemic Stroke Rate (% per year) is equal to 9.7 % stroke rate/year from a score of 6  Above score calculated as 1 point each if present [CHF, HTN, DM, Vascular=MI/PAD/Aortic Plaque, Age if 65-74, or Male] Above score calculated as 2 points each if present [Age > 75, or Stroke/TIA/TE]   2. HTN Stable, no changes today.   Follow up with Dr Curt Bears as scheduled.   [facility] [address] [place], [place] [address] [phone] 05/13/2019 12:54 PM

## 2019-05-15 ENCOUNTER — Other Ambulatory Visit: Payer: Self-pay | Admitting: Internal Medicine

## 2019-05-19 DIAGNOSIS — E039 Hypothyroidism, unspecified: Secondary | ICD-10-CM | POA: Insufficient documentation

## 2019-05-19 NOTE — Patient Instructions (Addendum)
  Tests ordered today. Your results will be released to [place] (or called to you) after review.  If any changes need to be made, you will be notified at that same time.  Flu immunization administered today.    Medications reviewed and updated.  Changes include :   none   Please followup in 3 months

## 2019-05-19 NOTE — Progress Notes (Signed)
Subjective:    Patient ID: Brett Franklin, male    DOB: 07-Mar-1926, 85 y.o.   MRN: [id number]  HPI The patient is here for follow up.  Since he started with his A. fib his wife has noticed that he is less steady on his feet and seems less sharp.  He has been less active since the [place] pandemic started, but continues to exercise regularly.  He is used to going out and doing more things throughout the day and does find himself sitting more.  When he first gets up he is more unsteady and the more he walks he feels better.  Afib, Hypertension: He is taking his medication daily. He is compliant with a low sodium diet.  He denies chest pain, palpitations, edema, shortness of breath and regular headaches.  He does monitor his blood pressure at home - 118/84 - 139/70.  Since decreasing the dose of his medication at his last visit the lightheadedness has improved, but he has had a couple of episodes while sitting.  It was not like the other episode when he came in for a visit.  Diabetes: He is taking his medication daily as prescribed. He is compliant with a diabetic diet.   Hyperlipidemia: He is taking his medication daily. He is compliant with a low fat/cholesterol diet. He denies myalgias.   Hypothyroidism:  He is taking his medication daily.  This is a newer medication for him.  He denies any recent changes in energy or weight that are unexplained.   Anxiety:  He takes ativan rarely.  He does have some difficulty sleeping at times, but overall thinks he gets enough sleep.    Medications and allergies reviewed with patient and updated if appropriate.  Patient Active Problem List   Diagnosis Date Noted  . Acquired hypothyroidism 05/19/2019  . Lightheadedness 05/06/2019  . Atrial flutter ([place])   . Paroxysmal atrial fibrillation (H[facility])   . ETD (eustachian tube dysfunction) 02/17/2019  . Poor balance 10/08/2018  . Right foot pain 01/13/2017  . Hyperlipidemia 07/21/2011  . OTHER DYSPHAGIA  03/04/2010  . History of stroke 12/31/2009  . LOW BACK PAIN SYNDROME 11/12/2009  . CERVICAL RADICULOPATHY, LEFT 05/11/2008  . NEPHROLITHIASIS, HX OF 05/11/2008  . Diabetes mellitus with neurological manifestations, controlled ([place]) 11/16/2007  . Anxiety 07/08/2007  . Melanoma of skin ([place]) 01/21/2007  . Essential hypertension 01/21/2007  . IBS 01/21/2007  . BPH (benign prostatic hyperplasia) 01/21/2007  . STENOSIS, CERVICAL SPINAL 01/21/2007  . History of adenomatous polyp of colon 01/21/2007    Current Outpatient Medications on File Prior to Visit  Medication Sig Dispense Refill  . acetaminophen (TYLENOL) 650 MG CR tablet Take 650 mg by mouth every 8 (eight) hours as needed for pain.    Marland Kitchen apixaban (ELIQUIS) 5 MG TABS tablet Take 1 tablet (5 mg total) by mouth 2 (two) times daily. 60 tablet 5  . blood glucose meter kit and supplies KIT Dispense based on patient and insurance preference. Use to text blood sugars daily. 1 each 0  . clotrimazole ([employer]S CLOTRIMAZOLE) 1 % external solution Instill 3 drops into the right ear twice daily    . diltiazem (CARDIZEM CD) 120 MG 24 hr capsule Take 1 capsule (120 mg total) by mouth daily. 90 capsule 1  . glucose blood test strip Use to test blood sugars once daily 100 each 3  . levothyroxine (SYNTHROID) 25 MCG tablet TAKE 1 TABLET(25 MCG) BY MOUTH DAILY BEFORE BREAKFAST 90 tablet 10  .  LORazepam (ATIVAN) 0.5 MG tablet Take 1 tablet (0.5 mg total) by mouth every 12 (twelve) hours as needed for anxiety. (Patient taking differently: Take 0.25-0.5 mg by mouth every 12 (twelve) hours as needed for anxiety. ) 20 tablet 1  . Multiple Vitamins-Minerals (MACULAR VITAMIN BENEFIT PO) Take 1 tablet by mouth 2 (two) times daily.     . mupirocin ointment (BACTROBAN) 2 % Apply 1 application topically daily as needed (wound care).     . polyethylene glycol (MIRALAX / GLYCOLAX) 17 g packet Take 17 g by mouth daily.    . pravastatin (PRAVACHOL) 20 MG tablet TAKE 1  TABLET(20 MG) BY MOUTH DAILY 90 tablet 0  . sitaGLIPtin-metformin (JANUMET) 50-500 MG tablet Take 1 tablet by mouth daily. 90 tablet 0  . [DISCONTINUED] metoprolol tartrate (LOPRESSOR) 25 MG tablet Take 1 tablet (25 mg total) by mouth 2 (two) times daily. 180 tablet 1   No current facility-administered medications on file prior to visit.     Past Medical History:  Diagnosis Date  . Adenomatous polyp of colon 11/1996   Dr Fuller Plan  . Anxiety   . BPH (benign prostatic hyperplasia)   . Cervical spinal stenosis   . CVA (cerebral vascular accident) ([place])   . Diabetes mellitus, type 2 ([place])   . GERD (gastroesophageal reflux disease)   . Hemorrhoids   . Hypertension   . IBS (irritable bowel syndrome)   . Melanoma ([place])   . Nephrolithiasis     X 4    Past Surgical History:  Procedure Laterality Date  . CARDIOVERSION N/A 04/13/2019   Procedure: CARDIOVERSION;  Surgeon: Buford Dresser, MD;  Location: [facility] ENDOSCOPY;  Service: Cardiovascular;  Laterality: N/A;  . CATARACT EXTRACTION Left   . CHOLECYSTECTOMY    . COLONOSCOPY W/ POLYPECTOMY     Dr.Stark  . CYSTOSCOPY     Dr.Kimbrough     Social History   Socioeconomic History  . Marital status: Married    Spouse name: Not on file  . Number of children: 2  . Years of education: Not on file  . Highest education level: Not on file  Occupational History  . Occupation: retired    Fish farm manager: RETIRED  Social Needs  . Financial resource strain: Not hard at all  . Food insecurity    Worry: Never true    Inability: Never true  . Transportation needs    Medical: No    Non-medical: No  Tobacco Use  . Smoking status: Never Smoker  . Smokeless tobacco: Never Used  Substance and Sexual Activity  . Alcohol use: No  . Drug use: No  . Sexual activity: Not Currently  Lifestyle  . Physical activity    Days per week: 6 days    Minutes per session: 30 min  . Stress: Not at all  Relationships  . Social connections    Talks on phone:  More than three times a week    Gets together: More than three times a week    Attends religious service: More than 4 times per year    Active member of club or organization: Yes    Attends meetings of clubs or organizations: More than 4 times per year    Relationship status: Married  Other Topics Concern  . Not on file  Social History Narrative  . Not on file    Family History  Problem Relation Age of Onset  . Alcohol abuse Father   . Heart disease Father   . Heart  attack Maternal Grandfather 65  . Diabetes type I Other        grandson  . Cancer Neg Hx   . Stroke Neg Hx     Review of Systems  Constitutional: Positive for fatigue. Negative for chills and fever.  Respiratory: Negative for cough, shortness of breath and wheezing.   Cardiovascular: Negative for chest pain, palpitations and leg swelling.  Neurological: Positive for light-headedness (couple of episodes while sitting). Negative for headaches.  Psychiatric/Behavioral: Positive for sleep disturbance (broken sleep).       Objective:   Vitals:   05/20/19 0743 05/20/19 0837  BP: (!) 158/88 (!) 144/82  Pulse: 63   Resp: 16   Temp: 97.7 F (36.5 C)   SpO2: 99%    BP Readings from Last 3 Encounters:  05/20/19 (!) 144/82  05/13/19 138/66  05/06/19 (!) 170/82   Wt Readings from Last 3 Encounters:  05/20/19 163 lb (73.9 kg)  05/13/19 165 lb (74.8 kg)  05/06/19 167 lb (75.8 kg)   Body mass index is 23.39 kg/m.   Physical Exam    Constitutional: Appears well-developed and well-nourished. No distress.  Gait unsteady when he first starts to walk, which is not new. HENT:  Head: Normocephalic and atraumatic.  Neck: Neck supple. No tracheal deviation present. No thyromegaly present.  No cervical lymphadenopathy Cardiovascular: Normal rate, regular rhythm and normal heart sounds.   No murmur heard. No carotid bruit .  No edema Pulmonary/Chest: Effort normal and breath sounds normal. No respiratory distress. No  has no wheezes. No rales.  Skin: Skin is warm and dry. Not diaphoretic.  Psychiatric: Normal mood and affect. Behavior is normal.      Assessment & Plan:    See Problem List for Assessment and Plan of chronic medical problems.

## 2019-05-20 ENCOUNTER — Ambulatory Visit (INDEPENDENT_AMBULATORY_CARE_PROVIDER_SITE_OTHER): Payer: Medicare Other | Admitting: Internal Medicine

## 2019-05-20 ENCOUNTER — Other Ambulatory Visit (INDEPENDENT_AMBULATORY_CARE_PROVIDER_SITE_OTHER): Payer: Medicare Other

## 2019-05-20 ENCOUNTER — Ambulatory Visit: Payer: Medicare Other | Admitting: Internal Medicine

## 2019-05-20 ENCOUNTER — Other Ambulatory Visit: Payer: Self-pay

## 2019-05-20 ENCOUNTER — Encounter: Payer: Self-pay | Admitting: Internal Medicine

## 2019-05-20 VITALS — BP 144/82 | HR 63 | Temp 97.7°F | Resp 16 | Ht 70.0 in | Wt 163.0 lb

## 2019-05-20 DIAGNOSIS — E1149 Type 2 diabetes mellitus with other diabetic neurological complication: Secondary | ICD-10-CM

## 2019-05-20 DIAGNOSIS — F419 Anxiety disorder, unspecified: Secondary | ICD-10-CM

## 2019-05-20 DIAGNOSIS — I480 Paroxysmal atrial fibrillation: Secondary | ICD-10-CM | POA: Diagnosis not present

## 2019-05-20 DIAGNOSIS — E039 Hypothyroidism, unspecified: Secondary | ICD-10-CM

## 2019-05-20 DIAGNOSIS — H6983 Other specified disorders of Eustachian tube, bilateral: Secondary | ICD-10-CM

## 2019-05-20 DIAGNOSIS — Z23 Encounter for immunization: Secondary | ICD-10-CM

## 2019-05-20 DIAGNOSIS — E7849 Other hyperlipidemia: Secondary | ICD-10-CM | POA: Diagnosis not present

## 2019-05-20 DIAGNOSIS — I10 Essential (primary) hypertension: Secondary | ICD-10-CM | POA: Diagnosis not present

## 2019-05-20 LAB — COMPREHENSIVE METABOLIC PANEL
ALT: 11 U/L (ref 0–53)
AST: 16 U/L (ref 0–37)
Albumin: 4.7 g/dL (ref 3.5–5.2)
Alkaline Phosphatase: 53 U/L (ref 39–117)
BUN: 20 mg/dL (ref 6–23)
CO2: 27 mEq/L (ref 19–32)
Calcium: 9.7 mg/dL (ref 8.4–10.5)
Chloride: 102 mEq/L (ref 96–112)
Creatinine, Ser: 1.13 mg/dL (ref 0.40–1.50)
GFR: 60.76 mL/min (ref >60.00)
Glucose, Bld: 180 mg/dL — ABNORMAL HIGH (ref 70–99)
Potassium: 4.3 mEq/L (ref 3.5–5.1)
Sodium: 138 mEq/L (ref 135–145)
Total Bilirubin: 0.8 mg/dL (ref 0.2–1.2)
Total Protein: 7.5 g/dL (ref 6.0–8.3)

## 2019-05-20 LAB — CBC WITH DIFFERENTIAL/PLATELET
Basophils Absolute: 0 10*3/uL (ref 0.0–0.1)
Basophils Relative: 0.4 % (ref 0.0–3.0)
Eosinophils Absolute: 0.1 10*3/uL (ref 0.0–0.7)
Eosinophils Relative: 0.8 % (ref 0.0–5.0)
HCT: 45.8 % (ref 39.0–52.0)
Hemoglobin: 15.4 g/dL (ref 13.0–17.0)
Lymphocytes Relative: 11.7 % — ABNORMAL LOW (ref 12.0–46.0)
Lymphs Abs: 1.1 10*3/uL (ref 0.7–4.0)
MCHC: 33.6 g/dL (ref 30.0–36.0)
MCV: 98.1 fl (ref 78.0–100.0)
Monocytes Absolute: 0.9 10*3/uL (ref 0.1–1.0)
Monocytes Relative: 9.4 % (ref 3.0–12.0)
Neutro Abs: 7.4 10*3/uL (ref 1.4–7.7)
Neutrophils Relative %: 77.7 % — ABNORMAL HIGH (ref 43.0–77.0)
Platelets: 196 10*3/uL (ref 150.0–400.0)
RBC: 4.66 Mil/uL (ref 4.22–5.81)
RDW: 14.6 % (ref 11.5–15.5)
WBC: 9.5 10*3/uL (ref 4.0–10.5)

## 2019-05-20 LAB — HEMOGLOBIN A1C: Hgb A1c MFr Bld: 6.8 % — ABNORMAL HIGH (ref 4.6–6.5)

## 2019-05-20 LAB — LIPID PANEL
Cholesterol: 124 mg/dL (ref 0–200)
HDL: 70.6 mg/dL (ref >39.00)
LDL Cholesterol: 45 mg/dL (ref 0–99)
NonHDL: 53.87
Total CHOL/HDL Ratio: 2
Triglycerides: 44 mg/dL (ref 0.0–149.0)
VLDL: 8.8 mg/dL (ref 0.0–40.0)

## 2019-05-20 LAB — TSH: TSH: 3.72 u[IU]/mL (ref 0.35–4.50)

## 2019-05-20 MED ORDER — INFLUENZA VAC A&B SA ADJ QUAD 0.5 ML IM PRSY: 0.5000 mL | PREFILLED_SYRINGE | Freq: Once | INTRAMUSCULAR | 0 refills | Status: AC

## 2019-05-20 NOTE — Assessment & Plan Note (Signed)
Rarely takes Lorazepam for anxiety Okay to continue

## 2019-05-20 NOTE — Assessment & Plan Note (Signed)
Blood pressure elevated here, but better controlled at home No concerning episodes of lightheadedness since decreasing the dose of medication Continue diltiazem at current dose Has cardiology follow-up in a couple of months

## 2019-05-20 NOTE — Assessment & Plan Note (Addendum)
In sinus rhythm here On Eliquis, diltiazem Has had a couple episodes of lightheadedness, but occurred when he was sitting and very transient No other concerning symptoms Has follow-up with cardiology in a couple of months CMP, CBC

## 2019-05-20 NOTE — Assessment & Plan Note (Addendum)
Check A1c, urine microalbumin He is compliant with a diabetic diet He is exercising regularly

## 2019-05-20 NOTE — Assessment & Plan Note (Signed)
Continue statin Check lipid panel, CMP 

## 2019-05-20 NOTE — Assessment & Plan Note (Signed)
Continues to have eustachian tube dysfunction He feels good when he is laying down, but once he gets up he starts to feel fuller congested in his head Following with ENT Will be getting new hearing aids soon

## 2019-05-20 NOTE — Assessment & Plan Note (Signed)
Has been taking medication-seems to be tolerating it well We will check TSH and titrate medication dose if necessary

## 2019-06-01 ENCOUNTER — Other Ambulatory Visit: Payer: Self-pay

## 2019-06-01 ENCOUNTER — Emergency Department (HOSPITAL_COMMUNITY)
Admission: EM | Admit: 2019-06-01 | Discharge: 2019-06-01 | Disposition: A | Discharge status: Home / Self Care | Payer: Medicare Other | Attending: Emergency Medicine | Admitting: Emergency Medicine

## 2019-06-01 ENCOUNTER — Ambulatory Visit (HOSPITAL_COMMUNITY): Payer: Medicare Other | Admitting: Physician Assistant

## 2019-06-01 ENCOUNTER — Encounter (HOSPITAL_COMMUNITY): Payer: Self-pay

## 2019-06-01 DIAGNOSIS — Z7984 Long term (current) use of oral hypoglycemic drugs: Secondary | ICD-10-CM | POA: Insufficient documentation

## 2019-06-01 DIAGNOSIS — Z7901 Long term (current) use of anticoagulants: Secondary | ICD-10-CM | POA: Insufficient documentation

## 2019-06-01 DIAGNOSIS — I10 Essential (primary) hypertension: Secondary | ICD-10-CM | POA: Diagnosis not present

## 2019-06-01 DIAGNOSIS — Z79899 Other long term (current) drug therapy: Secondary | ICD-10-CM | POA: Diagnosis not present

## 2019-06-01 DIAGNOSIS — E119 Type 2 diabetes mellitus without complications: Secondary | ICD-10-CM | POA: Diagnosis not present

## 2019-06-01 DIAGNOSIS — I480 Paroxysmal atrial fibrillation: Secondary | ICD-10-CM | POA: Diagnosis not present

## 2019-06-01 LAB — BASIC METABOLIC PANEL
Anion gap: 11 (ref 5–15)
BUN: 20 mg/dL (ref 8–23)
CO2: 22 mmol/L (ref 22–32)
Calcium: 8.9 mg/dL (ref 8.9–10.3)
Chloride: 107 mmol/L (ref 98–111)
Creatinine, Ser: 1.04 mg/dL (ref 0.61–1.24)
GFR calc Af Amer: >60 mL/min (ref >60)
GFR calc non Af Amer: >60 mL/min (ref >60)
Glucose, Bld: 201 mg/dL — ABNORMAL HIGH (ref 70–99)
Potassium: 4.1 mmol/L (ref 3.5–5.1)
Sodium: 140 mmol/L (ref 135–145)

## 2019-06-01 LAB — CBC WITH DIFFERENTIAL/PLATELET
Abs Immature Granulocytes: 0.05 10*3/uL (ref 0.00–0.07)
Basophils Absolute: 0 10*3/uL (ref 0.0–0.1)
Basophils Relative: 0 %
Eosinophils Absolute: 0.1 10*3/uL (ref 0.0–0.5)
Eosinophils Relative: 1 %
HCT: 45.3 % (ref 39.0–52.0)
Hemoglobin: 14.7 g/dL (ref 13.0–17.0)
Immature Granulocytes: 1 %
Lymphocytes Relative: 10 %
Lymphs Abs: 1 10*3/uL (ref 0.7–4.0)
MCH: 32.5 pg (ref 26.0–34.0)
MCHC: 32.5 g/dL (ref 30.0–36.0)
MCV: 100.2 fL — ABNORMAL HIGH (ref 80.0–100.0)
Monocytes Absolute: 0.9 10*3/uL (ref 0.1–1.0)
Monocytes Relative: 9 %
Neutro Abs: 8.1 10*3/uL — ABNORMAL HIGH (ref 1.7–7.7)
Neutrophils Relative %: 79 %
Platelets: 167 10*3/uL (ref 150–400)
RBC: 4.52 MIL/uL (ref 4.22–5.81)
RDW: 13.6 % (ref 11.5–15.5)
WBC: 10.2 10*3/uL (ref 4.0–10.5)
nRBC: 0 % (ref 0.0–0.2)

## 2019-06-01 MED ORDER — DILTIAZEM HCL 30 MG PO TABS: ORAL_TABLET | ORAL | 0 refills | Status: DC

## 2019-06-01 NOTE — ED Provider Notes (Signed)
[facility] [facility] EMERGENCY DEPARTMENT Provider Note   CSN: [id number] Arrival date & time: 06/01/19  1307     History   Chief Complaint Chief Complaint  Patient presents with  . Atrial Fibrillation    HPI Brett Franklin is a 85 y.o. male.     85yo M w/ PMH including A fib on Eliquis, CVA, T2DM, HTN, melanoma who p/w A fib.  Patient was at his home this morning when he began feeling irregular, racing heart rate which she has had previously with atrial fibrillation.  He called cardiology and was going to be seen in the clinic but his symptoms got worse and he began feeling lightheaded like he was going to pass out so he called EMS.  EMS noted that he was in atrial fibrillation with RVR at a rate of 1 70-1 80.  They gave him 5 mg IV Lopressor and he converted to sinus rhythm.  Now he feels fine and states that all of his symptoms have resolved.  He denies any chest pain or shortness of breath during the episode.  He is compliant with all of his medications including Eliquis.  Of note, he had a cardioversion for atrial fibrillation in July this year.  The history is provided by the patient.  Atrial Fibrillation    Past Medical History:  Diagnosis Date  . Adenomatous polyp of colon 11/1996   Dr Fuller Plan  . Anxiety   . BPH (benign prostatic hyperplasia)   . Cervical spinal stenosis   . CVA (cerebral vascular accident) ([place])   . Diabetes mellitus, type 2 ([place])   . GERD (gastroesophageal reflux disease)   . Hemorrhoids   . Hypertension   . IBS (irritable bowel syndrome)   . Melanoma ([place])   . Nephrolithiasis     X 4    Patient Active Problem List   Diagnosis Date Noted  . Acquired hypothyroidism 05/19/2019  . Lightheadedness 05/06/2019  . Atrial flutter ([place])   . Paroxysmal atrial fibrillation (HCC)   . ETD (eustachian tube dysfunction) 02/17/2019  . Poor balance 10/08/2018  . Right foot pain 01/13/2017  . Hyperlipidemia 07/21/2011  . OTHER DYSPHAGIA 03/04/2010   . History of stroke 12/31/2009  . LOW BACK PAIN SYNDROME 11/12/2009  . CERVICAL RADICULOPATHY, LEFT 05/11/2008  . NEPHROLITHIASIS, HX OF 05/11/2008  . Diabetes mellitus with neurological manifestations, controlled ([place]) 11/16/2007  . Anxiety 07/08/2007  . Melanoma of skin ([place]) 01/21/2007  . Essential hypertension 01/21/2007  . IBS 01/21/2007  . BPH (benign prostatic hyperplasia) 01/21/2007  . STENOSIS, CERVICAL SPINAL 01/21/2007  . History of adenomatous polyp of colon 01/21/2007    Past Surgical History:  Procedure Laterality Date  . CARDIOVERSION N/A 04/13/2019   Procedure: CARDIOVERSION;  Surgeon: Buford Dresser, MD;  Location: [facility] ENDOSCOPY;  Service: Cardiovascular;  Laterality: N/A;  . CATARACT EXTRACTION Left   . CHOLECYSTECTOMY    . COLONOSCOPY W/ POLYPECTOMY     Dr.Stark  . CYSTOSCOPY     Dr.Kimbrough         Home Medications    Prior to Admission medications   Medication Sig Start Date End Date Taking? Authorizing Provider  acetaminophen (TYLENOL) 650 MG CR tablet Take 650 mg by mouth every 8 (eight) hours as needed for pain.    [provider]  apixaban (ELIQUIS) 5 MG TABS tablet Take 1 tablet (5 mg total) by mouth 2 (two) times daily. 03/18/19   Binnie Rail, MD  blood glucose meter kit  and supplies KIT Dispense based on patient and insurance preference. Use to text blood sugars daily. 04/08/18   Binnie Rail, MD  clotrimazole ([employer] CLOTRIMAZOLE) 1 % external solution Instill 3 drops into the right ear twice daily    [provider]  diltiazem (CARDIZEM CD) 120 MG 24 hr capsule Take 1 capsule (120 mg total) by mouth daily. 05/06/19   Binnie Rail, MD  diltiazem (CARDIZEM) 30 MG tablet Take 1 tablet by mouth every 6 hours as needed for fast heart rate (atrial fibrillation) episodes 06/01/19   , Wenda Overland, MD  glucose blood test strip Use to test blood sugars once daily 04/07/18   Binnie Rail, MD  levothyroxine (SYNTHROID) 25 MCG  tablet TAKE 1 TABLET(25 MCG) BY MOUTH DAILY BEFORE BREAKFAST 05/16/19   Burns, Claudina Lick, MD  LORazepam (ATIVAN) 0.5 MG tablet Take 1 tablet (0.5 mg total) by mouth every 12 (twelve) hours as needed for anxiety. Patient taking differently: Take 0.25-0.5 mg by mouth every 12 (twelve) hours as needed for anxiety.  02/17/19   Binnie Rail, MD  Multiple Vitamins-Minerals (MACULAR VITAMIN BENEFIT PO) Take 1 tablet by mouth 2 (two) times daily.     [provider]  mupirocin ointment (BACTROBAN) 2 % Apply 1 application topically daily as needed (wound care).  02/16/19   [provider]  polyethylene glycol (MIRALAX / GLYCOLAX) 17 g packet Take 17 g by mouth daily.    [provider]  pravastatin (PRAVACHOL) 20 MG tablet TAKE 1 TABLET(20 MG) BY MOUTH DAILY 03/14/19   Burns, Claudina Lick, MD  sitaGLIPtin-metformin (JANUMET) 50-500 MG tablet Take 1 tablet by mouth daily. 05/16/19   Binnie Rail, MD  metoprolol tartrate (LOPRESSOR) 25 MG tablet Take 1 tablet (25 mg total) by mouth 2 (two) times daily. 03/18/19 03/24/19  Binnie Rail, MD    Family History Family History  Problem Relation Age of Onset  . Alcohol abuse Father   . Heart disease Father   . Heart attack Maternal Grandfather 65  . Diabetes type I Other        grandson  . Cancer Neg Hx   . Stroke Neg Hx     Social History Social History   Tobacco Use  . Smoking status: Never Smoker  . Smokeless tobacco: Never Used  Substance Use Topics  . Alcohol use: No  . Drug use: No     Allergies   Lisinopril   Review of Systems Review of Systems All other systems reviewed and are negative except that which was mentioned in HPI   Physical Exam Updated Vital Signs BP 116/68   Pulse 64   Temp 98 F (36.7 C)   Resp 10   Ht 5' 10"  (1.778 m)   Wt 74 kg   SpO2 99%   BMI 23.41 kg/m   Physical Exam Vitals signs and nursing note reviewed.  Constitutional:      General: He is not in acute distress.     Appearance: He is well-developed.  HENT:     Head: Normocephalic and atraumatic.  Eyes:     Conjunctiva/sclera: Conjunctivae normal.  Neck:     Musculoskeletal: Neck supple.  Cardiovascular:     Rate and Rhythm: Regular rhythm. Bradycardia present.     Heart sounds: Normal heart sounds. No murmur.  Pulmonary:     Effort: Pulmonary effort is normal.     Breath sounds: Normal breath sounds.  Abdominal:  General: Bowel sounds are normal. There is no distension.     Palpations: Abdomen is soft.     Tenderness: There is no abdominal tenderness.  Musculoskeletal:     Right lower leg: No edema.     Left lower leg: No edema.  Skin:    General: Skin is warm and dry.  Neurological:     Mental Status: He is alert and oriented to person, place, and time.     Comments: Fluent speech  Psychiatric:        Judgment: Judgment normal.      ED Treatments / Results  Labs (all labs ordered are listed, but only abnormal results are displayed) Labs Reviewed  BASIC METABOLIC PANEL - Abnormal; Notable for the following components:      Result Value   Glucose, Bld 201 (*)    All other components within normal limits  CBC WITH DIFFERENTIAL/PLATELET - Abnormal; Notable for the following components:   MCV 100.2 (*)    Neutro Abs 8.1 (*)    All other components within normal limits    EKG None  Radiology No results found.  Procedures Procedures (including critical care time)  Medications Ordered in ED Medications - No data to display   Initial Impression / Assessment and Plan / ED Course  I have reviewed the triage vital signs and the nursing notes.  Pertinent labs that were available during my care of the patient were reviewed by me and considered in my medical decision making (see chart for details).       Comfortable, HR 60s and sinus rhythm on arrival. No complaints. Compliant with meds at home. Screening labs reassuring.  I discussed with cardiologist, Dr. Gwenlyn Found, who  reviewed patient's medications and felt that he should continue his current home regimen given that his current heart rate is in the 42s.  We did agree to give him a prescription for as needed 30 mg short acting diltiazem if he has any A. fib episodes at home.  We will have him follow-up with the A. fib clinic.  I have extensively reviewed return precautions and he voiced understanding.  This patients CHA2DS2-VASc Score and unadjusted Ischemic Stroke Rate (% per year) is equal to 9.7 % stroke rate/year from a score of 6  Above score calculated as 1 point each if present [CHF, HTN, DM, Vascular=MI/PAD/Aortic Plaque, Age if 65-74, or Male] Above score calculated as 2 points each if present [Age > 75, or Stroke/TIA/TE]  Final Clinical Impressions(s) / ED Diagnoses   Final diagnoses:  Paroxysmal atrial fibrillation with rapid ventricular response [facility])    ED Discharge Orders         Ordered    diltiazem (CARDIZEM) 30 MG tablet     06/01/19 1426           , Wenda Overland, MD 06/01/19 1456

## 2019-06-01 NOTE — ED Triage Notes (Signed)
Pt BIB [facility] for eval of afib RVR this AM while at home. Pt reports that he felt off and felt his heart was irregular this AM, called EMS. Found to be in afib rate 170-180, hx of same w/ cardioversion earlier this month. Appt at the heart center this afternoon, but felt poorly this AM. Rec'd 5mg  Lopressor by EMS w/ conversion to sinus rhythm in the 60's on arrival. Pt reports chest pain free, denies SOB, palpitations,

## 2019-06-01 NOTE — ED Notes (Signed)
Patient verbalizes understanding of discharge instructions. Opportunity for questioning and answers were provided. Armband removed by staff, pt discharged from ED ambulatory w/ wife 

## 2019-06-03 ENCOUNTER — Other Ambulatory Visit: Payer: Self-pay

## 2019-06-03 ENCOUNTER — Ambulatory Visit (HOSPITAL_COMMUNITY)
Admission: RE | Admit: 2019-06-03 | Discharge: 2019-06-03 | Disposition: A | Discharge status: Home / Self Care | Payer: Medicare Other | Source: Ambulatory Visit | Attending: Physician Assistant | Admitting: Physician Assistant

## 2019-06-03 ENCOUNTER — Encounter (HOSPITAL_COMMUNITY): Payer: Self-pay | Admitting: Physician Assistant

## 2019-06-03 VITALS — BP 136/78 | HR 82 | Ht 70.0 in | Wt 163.8 lb

## 2019-06-03 DIAGNOSIS — F419 Anxiety disorder, unspecified: Secondary | ICD-10-CM | POA: Diagnosis not present

## 2019-06-03 DIAGNOSIS — K219 Gastro-esophageal reflux disease without esophagitis: Secondary | ICD-10-CM | POA: Insufficient documentation

## 2019-06-03 DIAGNOSIS — Z7901 Long term (current) use of anticoagulants: Secondary | ICD-10-CM | POA: Diagnosis not present

## 2019-06-03 DIAGNOSIS — Z8673 Personal history of transient ischemic attack (TIA), and cerebral infarction without residual deficits: Secondary | ICD-10-CM | POA: Insufficient documentation

## 2019-06-03 DIAGNOSIS — I480 Paroxysmal atrial fibrillation: Secondary | ICD-10-CM | POA: Diagnosis not present

## 2019-06-03 DIAGNOSIS — K589 Irritable bowel syndrome without diarrhea: Secondary | ICD-10-CM | POA: Insufficient documentation

## 2019-06-03 DIAGNOSIS — I10 Essential (primary) hypertension: Secondary | ICD-10-CM | POA: Insufficient documentation

## 2019-06-03 DIAGNOSIS — Z79899 Other long term (current) drug therapy: Secondary | ICD-10-CM | POA: Insufficient documentation

## 2019-06-03 DIAGNOSIS — I4819 Other persistent atrial fibrillation: Secondary | ICD-10-CM | POA: Diagnosis present

## 2019-06-03 DIAGNOSIS — I4892 Unspecified atrial flutter: Secondary | ICD-10-CM | POA: Insufficient documentation

## 2019-06-03 DIAGNOSIS — M4802 Spinal stenosis, cervical region: Secondary | ICD-10-CM | POA: Insufficient documentation

## 2019-06-03 DIAGNOSIS — E118 Type 2 diabetes mellitus with unspecified complications: Secondary | ICD-10-CM | POA: Insufficient documentation

## 2019-06-03 NOTE — Progress Notes (Signed)
Primary Care Physician: Binnie Rail, MD Primary Electrophysiologist: Dr Curt Bears Referring Physician: Dr Jamey Ripa is a 85 y.o. male with a history of CVA, DM, HTN, and persistent atrial fibrillation and atrial flutter who presents for follow up in the [facility]. He presented to his primary physician after finding that his heart rate was fast and his blood pressure was low on his home blood pressure cuff.  His heart rates reportedly were in the 140s.  His primary physician found him to be in atrial fibrillation.  He did have weakness and fatigue at that time. He denies snoring or significant alcohol use. No specific triggers that the patient could identify.   On follow up today, patient had another episode of afib on 06/01/19. He scheduled an appointment to come into the AF clinic but his symptoms of heart racing and presyncope worsened so he called EMS. EMS noted he was in afib with HR 170s. Patient converted to SR with 5 mg IV Lopressor and his symptoms abated. He was given PRN diltiazem at the ER. There were no specific triggers that the patient could identify. He feels well today.   Today, he denies symptoms of palpitations, chest pain, shortness of breath, orthopnea, PND, lower extremity edema, dizziness, presyncope, syncope, snoring, daytime somnolence, bleeding, or neurologic sequela. The patient is tolerating medications without difficulties and is otherwise without complaint today.    Atrial Fibrillation Risk Factors:  he does not have symptoms or diagnosis of sleep apnea. he does not have a history of rheumatic fever. he does not have a history of alcohol use. The patient does not have a history of early familial atrial fibrillation or other arrhythmias.  he has a BMI of Body mass index is 23.5 kg/m.Marland Kitchen Filed Weights   06/03/19 1002  Weight: 74.3 kg    Family History  Problem Relation Age of Onset  . Alcohol abuse Father   .  Heart disease Father   . Heart attack Maternal Grandfather 65  . Diabetes type I Other        grandson  . Cancer Neg Hx   . Stroke Neg Hx      Atrial Fibrillation Management history:  Previous antiarrhythmic drugs: none Previous cardioversions: 04/13/19 Previous ablations: none CHADS2VASC score: 6 Anticoagulation history: Eliquis 5 mg BID   Past Medical History:  Diagnosis Date  . Adenomatous polyp of colon 11/1996   Dr Fuller Plan  . Anxiety   . BPH (benign prostatic hyperplasia)   . Cervical spinal stenosis   . CVA (cerebral vascular accident) ([place])   . Diabetes mellitus, type 2 ([place])   . GERD (gastroesophageal reflux disease)   . Hemorrhoids   . Hypertension   . IBS (irritable bowel syndrome)   . Melanoma ([place])   . Nephrolithiasis     X 4   Past Surgical History:  Procedure Laterality Date  . CARDIOVERSION N/A 04/13/2019   Procedure: CARDIOVERSION;  Surgeon: Buford Dresser, MD;  Location: [facility] ENDOSCOPY;  Service: Cardiovascular;  Laterality: N/A;  . CATARACT EXTRACTION Left   . CHOLECYSTECTOMY    . COLONOSCOPY W/ POLYPECTOMY     Dr.Stark  . CYSTOSCOPY     Dr.Kimbrough     Current Outpatient Medications  Medication Sig Dispense Refill  . apixaban (ELIQUIS) 5 MG TABS tablet Take 1 tablet (5 mg total) by mouth 2 (two) times daily. 60 tablet 5  . diltiazem (CARDIZEM CD) 120 MG 24 hr capsule Take  1 capsule (120 mg total) by mouth daily. 90 capsule 1  . diltiazem (CARDIZEM) 30 MG tablet Take 1 tablet by mouth every 6 hours as needed for fast heart rate (atrial fibrillation) episodes 10 tablet 0  . levothyroxine (SYNTHROID) 25 MCG tablet TAKE 1 TABLET(25 MCG) BY MOUTH DAILY BEFORE BREAKFAST 90 tablet 10  . LORazepam (ATIVAN) 0.5 MG tablet Take 1 tablet (0.5 mg total) by mouth every 12 (twelve) hours as needed for anxiety. (Patient taking differently: Take 0.25-0.5 mg by mouth every 12 (twelve) hours as needed for anxiety. ) 20 tablet 1  . Multiple Vitamins-Minerals  (MACULAR VITAMIN BENEFIT PO) Take 1 tablet by mouth 2 (two) times daily.     . polyethylene glycol (MIRALAX / GLYCOLAX) 17 g packet Take 17 g by mouth daily.    . pravastatin (PRAVACHOL) 20 MG tablet TAKE 1 TABLET(20 MG) BY MOUTH DAILY 90 tablet 0  . sitaGLIPtin-metformin (JANUMET) 50-500 MG tablet Take 1 tablet by mouth daily. 90 tablet 0  . acetaminophen (TYLENOL) 650 MG CR tablet Take 650 mg by mouth every 8 (eight) hours as needed for pain.    . blood glucose meter kit and supplies KIT Dispense based on patient and insurance preference. Use to text blood sugars daily. 1 each 0  . clotrimazole ([employer]S CLOTRIMAZOLE) 1 % external solution Instill 3 drops into the right ear twice daily    . glucose blood test strip Use to test blood sugars once daily 100 each 3  . mupirocin ointment (BACTROBAN) 2 % Apply 1 application topically daily as needed (wound care).      No current facility-administered medications for this encounter.     Allergies  Allergen Reactions  . Lisinopril Cough    Hacking cough; he was changed to losartan    Social History   Socioeconomic History  . Marital status: Married    Spouse name: Not on file  . Number of children: 2  . Years of education: Not on file  . Highest education level: Not on file  Occupational History  . Occupation: retired    Fish farm manager: RETIRED  Social Needs  . Financial resource strain: Not hard at all  . Food insecurity    Worry: Never true    Inability: Never true  . Transportation needs    Medical: No    Non-medical: No  Tobacco Use  . Smoking status: Never Smoker  . Smokeless tobacco: Never Used  Substance and Sexual Activity  . Alcohol use: No  . Drug use: No  . Sexual activity: Not Currently  Lifestyle  . Physical activity    Days per week: 6 days    Minutes per session: 30 min  . Stress: Not at all  Relationships  . Social connections    Talks on phone: More than three times a week    Gets together: More than three times a  week    Attends religious service: More than 4 times per year    Active member of club or organization: Yes    Attends meetings of clubs or organizations: More than 4 times per year    Relationship status: Married  . Intimate partner violence    Fear of current or ex partner: No    Emotionally abused: No    Physically abused: No    Forced sexual activity: No  Other Topics Concern  . Not on file  Social History Narrative  . Not on file     ROS- All systems  are reviewed and negative except as per the HPI above.  Physical Exam: Vitals:   06/03/19 1002  BP: 136/78  Pulse: 82  Weight: 74.3 kg  Height: _0  (1.778 m)    GEN- The patient is well appearing elderly male, alert and oriented x 3 today.   HEENT-head normocephalic, atraumatic, sclera clear, conjunctiva pink, hearing intact, trachea midline. Lungs- Clear to ausculation bilaterally, normal work of breathing Heart- Regular rate and rhythm, no murmurs, rubs or gallops  GI- soft, NT, ND, + BS Extremities- no clubbing, cyanosis, or edema MS- no significant deformity or atrophy Skin- no rash or lesion Psych- euthymic mood, full affect Neuro- strength and sensation are intact   Wt Readings from Last 3 Encounters:  06/03/19 74.3 kg  06/01/19 74 kg  05/20/19 73.9 kg    EKG today demonstrates SR HR 82, NST, PR 160, QRS 100, QTc 429  Epic records are reviewed at length today  Assessment and Plan:  1. Persistent atrial fibrillation/atrial flutter Patient is s/p DCCV 04/13/19. We discussed therapeutic options including AAD therapy. Patient reluctant to start daily medication. He would like to try PRN CCB first. If AAD therapy required, pt prefers Multaq.  Continue diltiazem 120 mg daily with 30 mg PRN q 4 hrs for heart racing. Continue Eliquis 5 mg BID  This patients CHA2DS2-VASc Score and unadjusted Ischemic Stroke Rate (% per year) is equal to 9.7 % stroke rate/year from a score of 6  Above score calculated as 1  point each if present [CHF, HTN, DM, Vascular=MI/PAD/Aortic Plaque, Age if 65-74, or Male] Above score calculated as 2 points each if present [Age > 75, or Stroke/TIA/TE]   2. HTN Stable, no changes today.   Follow up with Dr Curt Bears as scheduled.    [facility] [address] [place], [place] [address] [phone] 06/03/2019 10:36 AM

## 2019-06-08 DIAGNOSIS — H9113 Presbycusis, bilateral: Secondary | ICD-10-CM | POA: Insufficient documentation

## 2019-06-09 ENCOUNTER — Telehealth (HOSPITAL_COMMUNITY): Payer: Self-pay | Admitting: *Deleted

## 2019-06-09 ENCOUNTER — Telehealth: Payer: Self-pay | Admitting: Cardiology

## 2019-06-09 MED ORDER — DILTIAZEM HCL 30 MG PO TABS: ORAL_TABLET | ORAL | 2 refills | Status: DC

## 2019-06-09 MED ORDER — MULTAQ 400 MG PO TABS: 400.0000 mg | ORAL_TABLET | Freq: Two times a day (BID) | ORAL | 6 refills | Status: DC

## 2019-06-09 NOTE — Telephone Encounter (Signed)
  Wife is calling because patient walked to the mailbox and his blood pressure went up to 179/108 P 139. She states that he has had a couple of these episodes within the last week where his heart rate has shot up. She would like to speak to the nurse.

## 2019-06-09 NOTE — Telephone Encounter (Signed)
Pt HR back in normal rhythm in the 70s. They are willing to try multaq now. Per Adline Peals PA will start Multaq 400mg  BID and follow up next week with ekg. Pt in agreement.

## 2019-06-09 NOTE — Telephone Encounter (Signed)
Called and spoke to patient and wife. They report that his HR became elevated at 139 bpm this AM after walking to the mailbox. He has taken a PRN diltiazem and his HR is currently in the 110s with BP 140/90. Advised patient to take additional diltiazem in about 30 minutes and call back to clinic with status update in one hour. Recall patient declined AAD therapy at his last visit.

## 2019-06-09 NOTE — Telephone Encounter (Signed)
I spoke to the patient's wife who is concerned because since their Afib Clinic visit on 9/4, the patient has had 2 episodes of elevated BP/HR and has had to take the extra Diltiazem 30 mg to slow the rate.    He had one this morning after walking to the mailbox HR 139, BP 179/108.  He took a Diltiazem 30 mg tablet and the HR dropped to 85 bpm over time.  She wanted someone to know that the patient has had this occur twice in 1 week.  Please advise, thank you

## 2019-06-15 ENCOUNTER — Encounter (HOSPITAL_COMMUNITY): Payer: Self-pay | Admitting: Physician Assistant

## 2019-06-15 ENCOUNTER — Ambulatory Visit (HOSPITAL_COMMUNITY)
Admission: RE | Admit: 2019-06-15 | Discharge: 2019-06-15 | Disposition: A | Discharge status: Home / Self Care | Payer: Medicare Other | Source: Ambulatory Visit | Attending: Physician Assistant | Admitting: Physician Assistant

## 2019-06-15 ENCOUNTER — Other Ambulatory Visit: Payer: Self-pay

## 2019-06-15 VITALS — BP 112/64 | HR 68

## 2019-06-15 DIAGNOSIS — I4891 Unspecified atrial fibrillation: Secondary | ICD-10-CM | POA: Diagnosis present

## 2019-06-15 DIAGNOSIS — Z79899 Other long term (current) drug therapy: Secondary | ICD-10-CM | POA: Insufficient documentation

## 2019-06-15 DIAGNOSIS — I480 Paroxysmal atrial fibrillation: Secondary | ICD-10-CM

## 2019-06-15 NOTE — Progress Notes (Signed)
Patient returns for ECG following Multaq initiation. Patient reports he feels much better on medication with no symptoms of heart racing or dizziness. No concerning side effects. ECG today shows SR HR 66, PR 186, QTc 431. Patient to follow up with Dr Curt Bears as scheduled.

## 2019-07-06 DIAGNOSIS — J323 Chronic sphenoidal sinusitis: Secondary | ICD-10-CM | POA: Insufficient documentation

## 2019-07-07 ENCOUNTER — Other Ambulatory Visit: Payer: Self-pay | Admitting: Internal Medicine

## 2019-07-18 ENCOUNTER — Ambulatory Visit (INDEPENDENT_AMBULATORY_CARE_PROVIDER_SITE_OTHER): Payer: Medicare Other | Admitting: Cardiology

## 2019-07-18 ENCOUNTER — Encounter: Payer: Self-pay | Admitting: Cardiology

## 2019-07-18 ENCOUNTER — Other Ambulatory Visit: Payer: Self-pay

## 2019-07-18 VITALS — BP 116/64 | HR 64 | Ht 70.0 in | Wt 167.0 lb

## 2019-07-18 DIAGNOSIS — I4819 Other persistent atrial fibrillation: Secondary | ICD-10-CM

## 2019-07-18 NOTE — Progress Notes (Signed)
Electrophysiology Office Note   Date:  07/18/2019   ID:  Brett Franklin, DOB 11-15-1925, MRN [id number]  PCP:  Binnie Rail, MD  Cardiologist:   Primary Electrophysiologist:  Jahleel Stroschein Meredith Leeds, MD    No chief complaint on file.    History of Present Illness: Brett Franklin is a 85 y.o. male who is being seen today for the evaluation of AF/flutter at the request of Burns, Claudina Lick, MD. Presenting today for electrophysiology evaluation.  He has a history of hypertension.  He presented to his primary physician last Friday after finding that his heart rate was fast and his blood pressure was low on his home blood pressure cuff.  His heart rates reportedly were in the 140s.  His primary physician found him to be in atrial fibrillation.  He did have weakness and fatigue at that time.  His metoprolol dose was increased to 25 mg twice a day.  He comes into clinic today reporting mild levels of fatigue.  He is tachycardic with heart rates in the 120s.  He had a cardioversion on 04/13/2019, but went back into atrial fibrillation and presented to the emergency room on 06/01/2019.  He converted to sinus rhythm with IV metoprolol.  He has since been started on Multaq.  Today, denies symptoms of palpitations, chest pain, shortness of breath, orthopnea, PND, lower extremity edema, claudication, dizziness, presyncope, syncope, bleeding, or neurologic sequela. The patient is tolerating medications without difficulties.  Overall he is doing well.  Since starting his Multaq, he has noted no further episodes of atrial fibrillation.  He is able to do all of his daily activities.  He continues to exercise.   Past Medical History:  Diagnosis Date  . Adenomatous polyp of colon 11/1996   Dr Fuller Plan  . Anxiety   . BPH (benign prostatic hyperplasia)   . Cervical spinal stenosis   . CVA (cerebral vascular accident) ([place])   . Diabetes mellitus, type 2 ([place])   . GERD (gastroesophageal reflux disease)   .  Hemorrhoids   . Hypertension   . IBS (irritable bowel syndrome)   . Melanoma ([place])   . Nephrolithiasis     X 4   Past Surgical History:  Procedure Laterality Date  . CARDIOVERSION N/A 04/13/2019   Procedure: CARDIOVERSION;  Surgeon: Buford Dresser, MD;  Location: [facility] ENDOSCOPY;  Service: Cardiovascular;  Laterality: N/A;  . CATARACT EXTRACTION Left   . CHOLECYSTECTOMY    . COLONOSCOPY W/ POLYPECTOMY     Dr.Stark  . CYSTOSCOPY     Dr.Kimbrough      Current Outpatient Medications  Medication Sig Dispense Refill  . acetaminophen (TYLENOL) 650 MG CR tablet Take 650 mg by mouth every 8 (eight) hours as needed for pain.    Marland Kitchen apixaban (ELIQUIS) 5 MG TABS tablet Take 1 tablet (5 mg total) by mouth 2 (two) times daily. 60 tablet 5  . blood glucose meter kit and supplies KIT Dispense based on patient and insurance preference. Use to text blood sugars daily. 1 each 0  . diltiazem (CARDIZEM CD) 120 MG 24 hr capsule Take 1 capsule (120 mg total) by mouth daily. 90 capsule 1  . diltiazem (CARDIZEM) 30 MG tablet Take 1 tablet by mouth every 6 hours as needed for fast heart rate (atrial fibrillation) episodes 45 tablet 2  . dronedarone (MULTAQ) 400 MG tablet Take 1 tablet (400 mg total) by mouth 2 (two) times daily with a meal. 60 tablet 6  . glucose  blood test strip Use to test blood sugars once daily 100 each 3  . levothyroxine (SYNTHROID) 25 MCG tablet TAKE 1 TABLET(25 MCG) BY MOUTH DAILY BEFORE BREAKFAST 90 tablet 10  . LORazepam (ATIVAN) 0.5 MG tablet Take 1 tablet (0.5 mg total) by mouth every 12 (twelve) hours as needed for anxiety. 20 tablet 1  . Multiple Vitamins-Minerals (MACULAR VITAMIN BENEFIT PO) Take 1 tablet by mouth 2 (two) times daily.     . mupirocin ointment (BACTROBAN) 2 % Apply 1 application topically daily as needed (wound care).     . polyethylene glycol (MIRALAX / GLYCOLAX) 17 g packet Take 17 g by mouth daily.    . pravastatin (PRAVACHOL) 20 MG tablet TAKE 1 TABLET(20  MG) BY MOUTH DAILY 90 tablet 1  . sitaGLIPtin-metformin (JANUMET) 50-500 MG tablet Take 1 tablet by mouth daily. 90 tablet 0   No current facility-administered medications for this visit.     Allergies:   Lisinopril   Social History:  The patient  reports that he has never smoked. He has never used smokeless tobacco. He reports that he does not drink alcohol or use drugs.   Family History:  The patient's family history includes Alcohol abuse in his father; Diabetes type I in an other family member; Heart attack (age of onset: 73) in his maternal grandfather; Heart disease in his father.   ROS:  Please see the history of present illness.   Otherwise, review of systems is positive for none.   All other systems are reviewed and negative.   PHYSICAL EXAM: VS:  BP 116/64   Pulse 64   Ht 5' 10"  (1.778 m)   Wt 167 lb (75.8 kg)   BMI 23.96 kg/m  , BMI Body mass index is 23.96 kg/m. GEN: Well nourished, well developed, in no acute distress  HEENT: normal  Neck: no JVD, carotid bruits, or masses Cardiac: RRR; no murmurs, rubs, or gallops,no edema  Respiratory:  clear to auscultation bilaterally, normal work of breathing GI: soft, nontender, nondistended, + BS MS: no deformity or atrophy  Skin: warm and dry Neuro:  Strength and sensation are intact Psych: euthymic mood, full affect  EKG:  EKG is ordered today. Personal review of the ekg ordered shows this rhythm, PVC, rate 64  Recent Labs: 05/20/2019: ALT 11; TSH 3.72 06/01/2019: BUN 20; Creatinine, Ser 1.04; Hemoglobin 14.7; Platelets 167; Potassium 4.1; Sodium 140    Lipid Panel     Component Value Date/Time   CHOL 124 05/20/2019 0832   TRIG 44.0 05/20/2019 0832   TRIG 36 09/04/2006 0915   HDL 70.60 05/20/2019 0832   CHOLHDL 2 05/20/2019 0832   VLDL 8.8 05/20/2019 0832   LDLCALC 45 05/20/2019 0832     Wt Readings from Last 3 Encounters:  07/18/19 167 lb (75.8 kg)  06/03/19 163 lb 12.8 oz (74.3 kg)  06/01/19 163 lb 2.3 oz  (74 kg)      Other studies Reviewed: Additional studies/ records that were reviewed today include: PCP records  ASSESSMENT AND PLAN:  1.  Persistent atrial fibrillation/flutter: Heart rates have been in the 140s.  He is now status post cardioversion and a subsequent trip to the emergency room with conversion after IV metoprolol.  Currently on diltiazem, Multaq and Eliquis.  Remains in sinus rhythm.  No changes.  This patients CHA2DS2-VASc Score and unadjusted Ischemic Stroke Rate (% per year) is equal to 9.7 % stroke rate/year from a score of 6  Above score calculated as  1 point each if present [CHF, HTN, DM, Vascular=MI/PAD/Aortic Plaque, Age if 65-74, or Male] Above score calculated as 2 points each if present [Age > 75, or Stroke/TIA/TE]   2.  Hypertension: Currently well controlled   Current medicines are reviewed at length with the patient today.   The patient does not have concerns regarding his medicines.  The following changes were made today: None  Labs/ tests ordered today include:  Orders Placed This Encounter  Procedures  . EKG 12-Lead     Disposition:   FU with Chanee Henrickson 6 months  Signed, Mio Schellinger Meredith Leeds, MD  07/18/2019 11:53 AM     [facility] [address] [place] [address] [phone] (office) [phone] (fax)

## 2019-07-22 ENCOUNTER — Encounter (INDEPENDENT_AMBULATORY_CARE_PROVIDER_SITE_OTHER): Payer: Self-pay

## 2019-08-09 ENCOUNTER — Other Ambulatory Visit: Payer: Self-pay | Admitting: Internal Medicine

## 2019-08-19 ENCOUNTER — Ambulatory Visit: Payer: Medicare Other | Admitting: Internal Medicine

## 2019-08-23 NOTE — Progress Notes (Signed)
Subjective:    Patient ID: Brett Franklin, male    DOB: January 22, 1926, 85 y.o.   MRN: [id number]  HPI The patient is here for follow up.  He is exercising regularly - walks.    Afib, Hypertension: He is taking his medication daily. He is compliant with a low sodium diet.  He denies chest pain, palpitations, edema, shortness of breath and regular headaches.  He does monitor his blood pressure at home - 120-130's/70's.    Hypothyroidism:  He is taking his medication daily.  He denies any recent changes in energy or weight that are unexplained.   Diabetes: He is taking his medication daily as prescribed. He is compliant with a diabetic diet.   He checks his feet daily and denies foot lesions. He is up-to-date with an ophthalmology examination.   Hyperlipidemia: He is taking his medication daily. He is compliant with a low fat/cholesterol diet. He denies myalgias.   Anxiety: He is taking the ativan as needed only. He denies any side effects from the medication. He feels his anxiety is well controlled and he is happy with his current dose of medication.     Medications and allergies reviewed with patient and updated if appropriate.  Patient Active Problem List   Diagnosis Date Noted  . Acquired hypothyroidism 05/19/2019  . Lightheadedness 05/06/2019  . Atrial flutter ([place])   . Paroxysmal atrial fibrillation (HCC)   . ETD (eustachian tube dysfunction) 02/17/2019  . Poor balance 10/08/2018  . Right foot pain 01/13/2017  . Hyperlipidemia 07/21/2011  . OTHER DYSPHAGIA 03/04/2010  . History of stroke 12/31/2009  . LOW BACK PAIN SYNDROME 11/12/2009  . CERVICAL RADICULOPATHY, LEFT 05/11/2008  . NEPHROLITHIASIS, HX OF 05/11/2008  . Diabetes mellitus with neurological manifestations, controlled ([place]) 11/16/2007  . Anxiety 07/08/2007  . Melanoma of skin ([place]) 01/21/2007  . Essential hypertension 01/21/2007  . IBS 01/21/2007  . BPH (benign prostatic hyperplasia) 01/21/2007  . STENOSIS,  CERVICAL SPINAL 01/21/2007  . History of adenomatous polyp of colon 01/21/2007    Current Outpatient Medications on File Prior to Visit  Medication Sig Dispense Refill  . acetaminophen (TYLENOL) 650 MG CR tablet Take 650 mg by mouth every 8 (eight) hours as needed for pain.    Marland Kitchen apixaban (ELIQUIS) 5 MG TABS tablet Take 1 tablet (5 mg total) by mouth 2 (two) times daily. 60 tablet 5  . blood glucose meter kit and supplies KIT Dispense based on patient and insurance preference. Use to text blood sugars daily. 1 each 0  . diltiazem (CARDIZEM CD) 120 MG 24 hr capsule Take 1 capsule (120 mg total) by mouth daily. 90 capsule 1  . diltiazem (CARDIZEM) 30 MG tablet Take 1 tablet by mouth every 6 hours as needed for fast heart rate (atrial fibrillation) episodes 45 tablet 2  . dronedarone (MULTAQ) 400 MG tablet Take 1 tablet (400 mg total) by mouth 2 (two) times daily with a meal. 60 tablet 6  . glucose blood test strip Use to test blood sugars once daily 100 each 3  . JANUMET 50-500 MG tablet TAKE 1 TABLET BY MOUTH DAILY 90 tablet 0  . levothyroxine (SYNTHROID) 25 MCG tablet TAKE 1 TABLET(25 MCG) BY MOUTH DAILY BEFORE BREAKFAST 90 tablet 10  . LORazepam (ATIVAN) 0.5 MG tablet Take 1 tablet (0.5 mg total) by mouth every 12 (twelve) hours as needed for anxiety. 20 tablet 1  . Multiple Vitamins-Minerals (MACULAR VITAMIN BENEFIT PO) Take 1 tablet by mouth 2 (  two) times daily.     . mupirocin ointment (BACTROBAN) 2 % Apply 1 application topically daily as needed (wound care).     . polyethylene glycol (MIRALAX / GLYCOLAX) 17 g packet Take 17 g by mouth daily.    . pravastatin (PRAVACHOL) 20 MG tablet TAKE 1 TABLET(20 MG) BY MOUTH DAILY 90 tablet 1  . [DISCONTINUED] metoprolol tartrate (LOPRESSOR) 25 MG tablet Take 1 tablet (25 mg total) by mouth 2 (two) times daily. 180 tablet 1   No current facility-administered medications on file prior to visit.     Past Medical History:  Diagnosis Date  .  Adenomatous polyp of colon 11/1996   Dr Fuller Plan  . Anxiety   . BPH (benign prostatic hyperplasia)   . Cervical spinal stenosis   . CVA (cerebral vascular accident) ([place])   . Diabetes mellitus, type 2 ([place])   . GERD (gastroesophageal reflux disease)   . Hearing loss   . Hemorrhoids   . Hypertension   . IBS (irritable bowel syndrome)   . Melanoma ([place])   . Nephrolithiasis     X 4    Past Surgical History:  Procedure Laterality Date  . CARDIOVERSION N/A 04/13/2019   Procedure: CARDIOVERSION;  Surgeon: Buford Dresser, MD;  Location: [facility] ENDOSCOPY;  Service: Cardiovascular;  Laterality: N/A;  . CATARACT EXTRACTION Left   . CHOLECYSTECTOMY    . COLONOSCOPY W/ POLYPECTOMY     Dr.Stark  . CYSTOSCOPY     Dr.Kimbrough     Social History   Socioeconomic History  . Marital status: Married    Spouse name: Not on file  . Number of children: 2  . Years of education: Not on file  . Highest education level: Not on file  Occupational History  . Occupation: retired    Fish farm manager: RETIRED  Social Needs  . Financial resource strain: Not hard at all  . Food insecurity    Worry: Never true    Inability: Never true  . Transportation needs    Medical: No    Non-medical: No  Tobacco Use  . Smoking status: Never Smoker  . Smokeless tobacco: Never Used  Substance and Sexual Activity  . Alcohol use: No  . Drug use: No  . Sexual activity: Not Currently  Lifestyle  . Physical activity    Days per week: 6 days    Minutes per session: 30 min  . Stress: Not at all  Relationships  . Social connections    Talks on phone: More than three times a week    Gets together: More than three times a week    Attends religious service: More than 4 times per year    Active member of club or organization: Yes    Attends meetings of clubs or organizations: More than 4 times per year    Relationship status: Married  Other Topics Concern  . Not on file  Social History Narrative  . Not on file     Family History  Problem Relation Age of Onset  . Alcohol abuse Father   . Heart disease Father   . Heart attack Maternal Grandfather 65  . Diabetes type I Other        grandson  . Cancer Neg Hx   . Stroke Neg Hx     Review of Systems  Constitutional: Negative for chills and fever.  Respiratory: Negative for cough, shortness of breath and wheezing.   Cardiovascular: Negative for chest pain, palpitations and leg swelling.  Neurological:  Negative for dizziness, light-headedness and headaches.       Objective:   Vitals:   08/24/19 0836  BP: (!) 144/72  Pulse: 61  Resp: 16  Temp: 97.9 F (36.6 C)  SpO2: 99%   BP Readings from Last 3 Encounters:  08/24/19 (!) 144/72  07/18/19 116/64  06/15/19 112/64   Wt Readings from Last 3 Encounters:  08/24/19 163 lb (73.9 kg)  07/18/19 167 lb (75.8 kg)  06/03/19 163 lb 12.8 oz (74.3 kg)   Body mass index is 23.39 kg/m.   Physical Exam    Constitutional: Appears well-developed and well-nourished. No distress.  HENT:  Head: Normocephalic and atraumatic.  Neck: Neck supple. No tracheal deviation present. No thyromegaly present.  No cervical lymphadenopathy Cardiovascular: Normal rate, regular rhythm and normal heart sounds.   No murmur heard. No carotid bruit .  No edema Pulmonary/Chest: Effort normal and breath sounds normal. No respiratory distress. No has no wheezes. No rales.  Skin: Skin is warm and dry. Not diaphoretic.  Psychiatric: Normal mood and affect. Behavior is normal.      Assessment & Plan:    See Problem List for Assessment and Plan of chronic medical problems.

## 2019-08-23 NOTE — Patient Instructions (Addendum)
  Medications reviewed and updated.  Changes include :   none    Please followup in 3 months   

## 2019-08-24 ENCOUNTER — Encounter: Payer: Self-pay | Admitting: Internal Medicine

## 2019-08-24 ENCOUNTER — Ambulatory Visit (INDEPENDENT_AMBULATORY_CARE_PROVIDER_SITE_OTHER): Payer: Medicare Other | Admitting: Internal Medicine

## 2019-08-24 ENCOUNTER — Other Ambulatory Visit: Payer: Self-pay

## 2019-08-24 VITALS — BP 144/72 | HR 61 | Temp 97.9°F | Resp 16 | Ht 70.0 in | Wt 163.0 lb

## 2019-08-24 DIAGNOSIS — E039 Hypothyroidism, unspecified: Secondary | ICD-10-CM | POA: Diagnosis not present

## 2019-08-24 DIAGNOSIS — F419 Anxiety disorder, unspecified: Secondary | ICD-10-CM

## 2019-08-24 DIAGNOSIS — E7849 Other hyperlipidemia: Secondary | ICD-10-CM

## 2019-08-24 DIAGNOSIS — I480 Paroxysmal atrial fibrillation: Secondary | ICD-10-CM

## 2019-08-24 DIAGNOSIS — E1149 Type 2 diabetes mellitus with other diabetic neurological complication: Secondary | ICD-10-CM | POA: Diagnosis not present

## 2019-08-24 DIAGNOSIS — I10 Essential (primary) hypertension: Secondary | ICD-10-CM | POA: Diagnosis not present

## 2019-08-24 MED ORDER — FREESTYLE LANCETS MISC: 1 refills | Status: DC

## 2019-08-24 NOTE — Assessment & Plan Note (Signed)
Diet controlled Walking regularly Sugars have been well controlled Will check a1c at next visit

## 2019-08-24 NOTE — Assessment & Plan Note (Signed)
In sinus here today Following with Dr Curt Bears Tolerating medication well, mild bruising with eliquis No palpitations, SOB, chest pain Continue current medication

## 2019-08-24 NOTE — Assessment & Plan Note (Signed)
Continue daily statin Regular exercise and healthy diet encouraged  

## 2019-08-24 NOTE — Assessment & Plan Note (Signed)
BP well controlled at home Current regimen effective and well tolerated Continue current medications at current doses

## 2019-08-24 NOTE — Assessment & Plan Note (Signed)
Continue current dose of medication Check tsh at next visit

## 2019-08-24 NOTE — Assessment & Plan Note (Signed)
Controlled Continue ativan prn

## 2019-09-02 ENCOUNTER — Telehealth: Payer: Self-pay | Admitting: Internal Medicine

## 2019-09-02 MED ORDER — GLUCOSE BLOOD VI STRP: ORAL_STRIP | 3 refills | Status: DC

## 2019-09-02 NOTE — Telephone Encounter (Signed)
Rx sent 

## 2019-09-02 NOTE — Telephone Encounter (Signed)
Medication Refill - Medication: glucose blood test strip    Has the patient contacted their pharmacy? Yes.   (Agent: If no, request that the patient contact the pharmacy for the refill.) (Agent: If yes, when and what did the pharmacy advise?)  Preferred Pharmacy (with phone number or street name):  [facility] [place], [address] AT [place] [phone] (Phone) [phone] (Fax)     Agent: Please be advised that RX refills may take up to 3 business days. We ask that you follow-up with your pharmacy.

## 2019-09-05 ENCOUNTER — Telehealth: Payer: Self-pay

## 2019-09-05 NOTE — Telephone Encounter (Signed)
Routing to stacey/cma---can you call [facility] to see what information is needed to get rx for glucose strips filled---i'm not sure if they need the actual name of the meter, you may need to call patient to get name of meter---this is rx dr burns sent in on 09/02/19

## 2019-09-06 MED ORDER — GLUCOSE BLOOD VI STRP: ORAL_STRIP | 3 refills | Status: DC

## 2019-09-06 NOTE — Addendum Note (Signed)
Addended by: Delice Bison E on: 09/06/2019 02:36 PM   Modules accepted: Orders

## 2019-09-06 NOTE — Telephone Encounter (Signed)
Spoke with wife and she asked me to send test strips to [facility]. Strips have been sent.

## 2019-09-09 ENCOUNTER — Other Ambulatory Visit: Payer: Self-pay | Admitting: *Deleted

## 2019-09-09 MED ORDER — APIXABAN 5 MG PO TABS: 5.0000 mg | ORAL_TABLET | Freq: Two times a day (BID) | ORAL | 5 refills | Status: DC

## 2019-10-06 ENCOUNTER — Ambulatory Visit (INDEPENDENT_AMBULATORY_CARE_PROVIDER_SITE_OTHER): Payer: Medicare Other | Admitting: Podiatry

## 2019-10-06 ENCOUNTER — Encounter: Payer: Self-pay | Admitting: Podiatry

## 2019-10-06 ENCOUNTER — Other Ambulatory Visit: Payer: Self-pay

## 2019-10-06 ENCOUNTER — Ambulatory Visit (INDEPENDENT_AMBULATORY_CARE_PROVIDER_SITE_OTHER): Payer: Medicare Other

## 2019-10-06 VITALS — BP 142/84 | HR 86 | Resp 16

## 2019-10-06 DIAGNOSIS — M7752 Other enthesopathy of left foot: Secondary | ICD-10-CM | POA: Diagnosis not present

## 2019-10-06 DIAGNOSIS — M79676 Pain in unspecified toe(s): Secondary | ICD-10-CM

## 2019-10-06 DIAGNOSIS — B351 Tinea unguium: Secondary | ICD-10-CM | POA: Diagnosis not present

## 2019-10-06 DIAGNOSIS — E1142 Type 2 diabetes mellitus with diabetic polyneuropathy: Secondary | ICD-10-CM

## 2019-10-06 DIAGNOSIS — Q828 Other specified congenital malformations of skin: Secondary | ICD-10-CM

## 2019-10-06 DIAGNOSIS — D689 Coagulation defect, unspecified: Secondary | ICD-10-CM

## 2019-10-06 NOTE — Progress Notes (Signed)
Subjective:  Patient ID: Brett Franklin, male    DOB: 08-07-1926,  MRN: [id number] HPI Chief Complaint  Patient presents with  . Foot Pain    Sub 5th MPJ left - callused, tender area x couple months, hurts to walk barefoot, no treatment  . Debridement    Requesting nail trim - would like to get set up on maintenance intervals for nail care  . New Patient (Initial Visit)    Est pt 09/2016    86 y.o. male presents with the above complaint.   ROS: Denies fever chills nausea vomiting muscle aches pains calf pain back pain chest pain shortness of breath.  Past Medical History:  Diagnosis Date  . Adenomatous polyp of colon 11/1996   Dr Fuller Plan  . Anxiety   . BPH (benign prostatic hyperplasia)   . Cervical spinal stenosis   . CVA (cerebral vascular accident) ([place])   . Diabetes mellitus, type 2 ([place])   . GERD (gastroesophageal reflux disease)   . Hearing loss   . Hemorrhoids   . Hypertension   . IBS (irritable bowel syndrome)   . Melanoma ([place])   . Nephrolithiasis     X 4   Past Surgical History:  Procedure Laterality Date  . CARDIOVERSION N/A 04/13/2019   Procedure: CARDIOVERSION;  Surgeon: Buford Dresser, MD;  Location: [facility] ENDOSCOPY;  Service: Cardiovascular;  Laterality: N/A;  . CATARACT EXTRACTION Left   . CHOLECYSTECTOMY    . COLONOSCOPY W/ POLYPECTOMY     Dr.Stark  . CYSTOSCOPY     Dr.Kimbrough     Current Outpatient Medications:  .  acetaminophen (TYLENOL) 650 MG CR tablet, Take 650 mg by mouth every 8 (eight) hours as needed for pain., Disp: , Rfl:  .  apixaban (ELIQUIS) 5 MG TABS tablet, Take 1 tablet (5 mg total) by mouth 2 (two) times daily., Disp: 60 tablet, Rfl: 5 .  blood glucose meter kit and supplies KIT, Dispense based on patient and insurance preference. Use to text blood sugars daily., Disp: 1 each, Rfl: 0 .  diltiazem (CARDIZEM CD) 120 MG 24 hr capsule, Take 1 capsule (120 mg total) by mouth daily., Disp: 90 capsule, Rfl: 1 .  diltiazem (CARDIZEM)  30 MG tablet, Take 1 tablet by mouth every 6 hours as needed for fast heart rate (atrial fibrillation) episodes, Disp: 45 tablet, Rfl: 2 .  dronedarone (MULTAQ) 400 MG tablet, Take 1 tablet (400 mg total) by mouth 2 (two) times daily with a meal., Disp: 60 tablet, Rfl: 6 .  fluticasone (FLONASE) 50 MCG/ACT nasal spray, Place 2 sprays into both nostrils daily., Disp: , Rfl:  .  glucose blood test strip, Use to test blood sugars once daily. Dx code-E11.49, Disp: 100 each, Rfl: 3 .  JANUMET 50-500 MG tablet, TAKE 1 TABLET BY MOUTH DAILY, Disp: 90 tablet, Rfl: 0 .  Lancets (FREESTYLE) lancets, Use to check blood sugars daily., Disp: 100 each, Rfl: 1 .  levothyroxine (SYNTHROID) 25 MCG tablet, TAKE 1 TABLET(25 MCG) BY MOUTH DAILY BEFORE BREAKFAST, Disp: 90 tablet, Rfl: 10 .  LORazepam (ATIVAN) 0.5 MG tablet, Take 1 tablet (0.5 mg total) by mouth every 12 (twelve) hours as needed for anxiety., Disp: 20 tablet, Rfl: 1 .  mometasone (NASONEX) 50 MCG/ACT nasal spray, SMARTSIG:2 Spray(s) Both Nares Every Night, Disp: , Rfl:  .  Multiple Vitamins-Minerals (MACULAR VITAMIN BENEFIT PO), Take 1 tablet by mouth 2 (two) times daily. , Disp: , Rfl:  .  mupirocin ointment (BACTROBAN) 2 %,  Apply 1 application topically daily as needed (wound care). , Disp: , Rfl:  .  polyethylene glycol (MIRALAX / GLYCOLAX) 17 g packet, Take 17 g by mouth daily., Disp: , Rfl:  .  pravastatin (PRAVACHOL) 20 MG tablet, TAKE 1 TABLET(20 MG) BY MOUTH DAILY, Disp: 90 tablet, Rfl: 1  Allergies  Allergen Reactions  . Lisinopril Cough    Hacking cough; he was changed to losartan   Review of Systems Objective:   Vitals:   10/06/19 0939  BP: (!) 142/84  Pulse: 86  Resp: 16    General: Well developed, nourished, in no acute distress, alert and oriented x3   Dermatological: Skin is warm, dry and supple bilateral. Nails x 10 are well maintained; remaining integument appears unremarkable at this time. There are no open sores, no  preulcerative lesions, no rash or signs of infection present.  His toenails are long thick yellow dystrophic clinically mycotic.  Reactive hyper keratoma subfifth left.  No open lesions.  Vascular: Dorsalis Pedis artery and Posterior Tibial artery pedal pulses are 2/4 bilateral with immedate capillary fill time. Pedal hair growth present. No varicosities and no lower extremity edema present bilateral.   Neruologic: Grossly intact via light touch bilateral. Vibratory intact via tuning fork bilateral. Protective threshold with Semmes Wienstein monofilament intact to all pedal sites bilateral. Patellar and Achilles deep tendon reflexes 2+ bilateral. No Babinski or clonus noted bilateral.   Musculoskeletal: No gross boney pedal deformities bilateral. No pain, crepitus, or limitation noted with foot and ankle range of motion bilateral. Muscular strength 5/5 in all groups tested bilateral.  He has pain on palpation of the fifth metatarsophalangeal joint plantarly.  There appears to be a boggy fluctuant mass nonpulsatile in nature.  There also is an overlying reactive hyperkeratotic lesion.  No open lesions or wounds are noted.  Gait: Unassisted, Nonantalgic.    Radiographs:  Radiographs taken today demonstrate a cocked up hammertoe deformity fifth left with some osteoarthritic changes of the fifth metatarsophalangeal joint.  No other acute findings are noted.  Assessment & Plan:   Assessment: Capsulitis bursitis fifth metatarsophalangeal joint left porokeratosis subfifth left pain in limb secondary to onychomycosis.  Plan: Injected dexamethasone and local anesthetic to the bursa today I also debrided the area of reactive hyperkeratosis placed padding.  Debrided toenails 1 through 5 bilateral.     Elva Mauro T. [place], [place]

## 2019-10-27 ENCOUNTER — Other Ambulatory Visit: Payer: Self-pay | Admitting: Internal Medicine

## 2019-10-27 ENCOUNTER — Other Ambulatory Visit (HOSPITAL_COMMUNITY): Payer: Self-pay | Admitting: Physician Assistant

## 2019-10-31 ENCOUNTER — Ambulatory Visit: Payer: Medicare Other | Attending: Internal Medicine

## 2019-10-31 DIAGNOSIS — Z20822 Contact with and (suspected) exposure to covid-19: Secondary | ICD-10-CM

## 2019-11-01 LAB — NOVEL CORONAVIRUS, NAA: SARS-CoV-2, NAA: NOT DETECTED

## 2019-11-05 ENCOUNTER — Other Ambulatory Visit: Payer: Self-pay | Admitting: Internal Medicine

## 2019-11-24 ENCOUNTER — Ambulatory Visit (INDEPENDENT_AMBULATORY_CARE_PROVIDER_SITE_OTHER): Payer: Medicare Other | Admitting: Internal Medicine

## 2019-11-24 ENCOUNTER — Encounter: Payer: Self-pay | Admitting: Internal Medicine

## 2019-11-24 ENCOUNTER — Other Ambulatory Visit: Payer: Self-pay

## 2019-11-24 VITALS — BP 126/80 | HR 76 | Temp 98.1°F | Resp 16 | Ht 70.0 in | Wt 161.0 lb

## 2019-11-24 DIAGNOSIS — E039 Hypothyroidism, unspecified: Secondary | ICD-10-CM | POA: Diagnosis not present

## 2019-11-24 DIAGNOSIS — E1149 Type 2 diabetes mellitus with other diabetic neurological complication: Secondary | ICD-10-CM

## 2019-11-24 DIAGNOSIS — I480 Paroxysmal atrial fibrillation: Secondary | ICD-10-CM

## 2019-11-24 DIAGNOSIS — J329 Chronic sinusitis, unspecified: Secondary | ICD-10-CM | POA: Insufficient documentation

## 2019-11-24 DIAGNOSIS — J0100 Acute maxillary sinusitis, unspecified: Secondary | ICD-10-CM

## 2019-11-24 DIAGNOSIS — I10 Essential (primary) hypertension: Secondary | ICD-10-CM

## 2019-11-24 DIAGNOSIS — E7849 Other hyperlipidemia: Secondary | ICD-10-CM

## 2019-11-24 DIAGNOSIS — F419 Anxiety disorder, unspecified: Secondary | ICD-10-CM

## 2019-11-24 LAB — COMPREHENSIVE METABOLIC PANEL
ALT: 12 U/L (ref 0–53)
AST: 13 U/L (ref 0–37)
Albumin: 3.7 g/dL (ref 3.5–5.2)
Alkaline Phosphatase: 53 U/L (ref 39–117)
BUN: 20 mg/dL (ref 6–23)
CO2: 26 mEq/L (ref 19–32)
Calcium: 9.3 mg/dL (ref 8.4–10.5)
Chloride: 103 mEq/L (ref 96–112)
Creatinine, Ser: 1.14 mg/dL (ref 0.40–1.50)
GFR: 60.08 mL/min (ref >60.00)
Glucose, Bld: 158 mg/dL — ABNORMAL HIGH (ref 70–99)
Potassium: 4.3 mEq/L (ref 3.5–5.1)
Sodium: 138 mEq/L (ref 135–145)
Total Bilirubin: 0.8 mg/dL (ref 0.2–1.2)
Total Protein: 7.1 g/dL (ref 6.0–8.3)

## 2019-11-24 LAB — LIPID PANEL
Cholesterol: 106 mg/dL (ref 0–200)
HDL: 54.9 mg/dL (ref >39.00)
LDL Cholesterol: 44 mg/dL (ref 0–99)
NonHDL: 51.56
Total CHOL/HDL Ratio: 2
Triglycerides: 38 mg/dL (ref 0.0–149.0)
VLDL: 7.6 mg/dL (ref 0.0–40.0)

## 2019-11-24 LAB — CBC WITH DIFFERENTIAL/PLATELET
Basophils Absolute: 0 10*3/uL (ref 0.0–0.1)
Basophils Relative: 0.2 % (ref 0.0–3.0)
Eosinophils Absolute: 0 10*3/uL (ref 0.0–0.7)
Eosinophils Relative: 0.4 % (ref 0.0–5.0)
HCT: 40.4 % (ref 39.0–52.0)
Hemoglobin: 13.7 g/dL (ref 13.0–17.0)
Lymphocytes Relative: 7.9 % — ABNORMAL LOW (ref 12.0–46.0)
Lymphs Abs: 0.9 10*3/uL (ref 0.7–4.0)
MCHC: 33.9 g/dL (ref 30.0–36.0)
MCV: 96.8 fl (ref 78.0–100.0)
Monocytes Absolute: 1.3 10*3/uL — ABNORMAL HIGH (ref 0.1–1.0)
Monocytes Relative: 11 % (ref 3.0–12.0)
Neutro Abs: 9.6 10*3/uL — ABNORMAL HIGH (ref 1.4–7.7)
Neutrophils Relative %: 80.5 % — ABNORMAL HIGH (ref 43.0–77.0)
Platelets: 220 10*3/uL (ref 150.0–400.0)
RBC: 4.17 Mil/uL — ABNORMAL LOW (ref 4.22–5.81)
RDW: 14.4 % (ref 11.5–15.5)
WBC: 11.9 10*3/uL — ABNORMAL HIGH (ref 4.0–10.5)

## 2019-11-24 LAB — MICROALBUMIN / CREATININE URINE RATIO
Creatinine,U: 236.6 mg/dL
Microalb Creat Ratio: 1.2 mg/g (ref 0.0–30.0)
Microalb, Ur: 2.8 mg/dL — ABNORMAL HIGH (ref 0.0–1.9)

## 2019-11-24 LAB — TSH: TSH: 3.06 u[IU]/mL (ref 0.35–4.50)

## 2019-11-24 MED ORDER — AMOXICILLIN-POT CLAVULANATE 875-125 MG PO TABS: 1.0000 | ORAL_TABLET | Freq: Two times a day (BID) | ORAL | 0 refills | Status: DC

## 2019-11-24 NOTE — Assessment & Plan Note (Signed)
Chronic BP well controlled Current regimen effective and well tolerated Continue current medications at current doses cmp  

## 2019-11-24 NOTE — Patient Instructions (Addendum)
  Blood work was ordered.     Medications reviewed and updated.  Changes include :   Augmentin for possible sinus infection    Please call if there is no improvement in your symptoms.    Please followup in 3 months

## 2019-11-24 NOTE — Assessment & Plan Note (Addendum)
Chronic Continue janumet Continue diabetic diet and regular exercise A1c, urine microalbumin

## 2019-11-24 NOTE — Progress Notes (Signed)
Subjective:    Patient ID: Brett Franklin, male    DOB: 01/18/1926, 86 y.o.   MRN: [id number]  HPI The patient is here for follow up of their chronic medical problems, including Afib, hypertension, hypothyroidism, diabetes, hyperlipidemia, anxiety.  His wife is here with him.  He is taking all of his medications as prescribed.    He is exercising regularly - walking.     For the past 2-3 weeks he has been coughing more and been bringing up sputum.  He has nasal congestion and sinus pressure.  He denies sore throat.  His ears do not hurt.  Denies headaches or dizziness.   He sleeps a lot - his wife states he sleeps every time he falls a sleep.  He disagrees with this and does not feel that he sleeps excessively.  His BP at home typically 120's/70's, rarely higher or lower his HR has been in the 60's    Medications and allergies reviewed with patient and updated if appropriate.  Patient Active Problem List   Diagnosis Date Noted  . Sinus infection 11/24/2019  . Chronic sphenoidal sinusitis 07/06/2019  . Presbycusis of both ears 06/08/2019  . Acquired hypothyroidism 05/19/2019  . Lightheadedness 05/06/2019  . Chronic mycotic otitis externa 04/15/2019  . Atrial flutter ([place])   . Paroxysmal atrial fibrillation (HCC)   . ETD (eustachian tube dysfunction) 02/17/2019  . Poor balance 10/08/2018  . Right foot pain 01/13/2017  . Hyperlipidemia 07/21/2011  . OTHER DYSPHAGIA 03/04/2010  . History of stroke 12/31/2009  . LOW BACK PAIN SYNDROME 11/12/2009  . CERVICAL RADICULOPATHY, LEFT 05/11/2008  . NEPHROLITHIASIS, HX OF 05/11/2008  . Diabetes mellitus with neurological manifestations, controlled ([place]) 11/16/2007  . Anxiety 07/08/2007  . Melanoma of skin ([place]) 01/21/2007  . Essential hypertension 01/21/2007  . IBS 01/21/2007  . BPH (benign prostatic hyperplasia) 01/21/2007  . STENOSIS, CERVICAL SPINAL 01/21/2007  . History of adenomatous polyp of colon 01/21/2007    Current  Outpatient Medications on File Prior to Visit  Medication Sig Dispense Refill  . acetaminophen (TYLENOL) 650 MG CR tablet Take 650 mg by mouth every 8 (eight) hours as needed for pain.    Marland Kitchen apixaban (ELIQUIS) 5 MG TABS tablet Take 1 tablet (5 mg total) by mouth 2 (two) times daily. 60 tablet 5  . blood glucose meter kit and supplies KIT Dispense based on patient and insurance preference. Use to text blood sugars daily. 1 each 0  . diltiazem (CARDIZEM CD) 120 MG 24 hr capsule TAKE 1 CAPSULE(120 MG) BY MOUTH DAILY 90 capsule 0  . diltiazem (CARDIZEM) 30 MG tablet TAKE 1 TABLET BY MOUTH EVERY 6 HOURS AS NEEDED FOR FAST HEART RATE(ATRIAL FIBRILLATION) EPISODES 45 tablet 2  . dronedarone (MULTAQ) 400 MG tablet Take 1 tablet (400 mg total) by mouth 2 (two) times daily with a meal. 60 tablet 6  . fluticasone (FLONASE) 50 MCG/ACT nasal spray Place 2 sprays into both nostrils daily.    Marland Kitchen glucose blood test strip Use to test blood sugars once daily. Dx code-E11.49 100 each 3  . JANUMET 50-500 MG tablet TAKE 1 TABLET BY MOUTH DAILY 90 tablet 0  . Lancets (FREESTYLE) lancets Use to check blood sugars daily. 100 each 1  . levothyroxine (SYNTHROID) 25 MCG tablet TAKE 1 TABLET(25 MCG) BY MOUTH DAILY BEFORE BREAKFAST 90 tablet 10  . LORazepam (ATIVAN) 0.5 MG tablet Take 1 tablet (0.5 mg total) by mouth every 12 (twelve) hours as needed for  anxiety. 20 tablet 1  . mometasone (NASONEX) 50 MCG/ACT nasal spray SMARTSIG:2 Spray(s) Both Nares Every Night    . polyethylene glycol (MIRALAX / GLYCOLAX) 17 g packet Take 17 g by mouth daily.    . pravastatin (PRAVACHOL) 20 MG tablet TAKE 1 TABLET(20 MG) BY MOUTH DAILY 90 tablet 1  . [DISCONTINUED] metoprolol tartrate (LOPRESSOR) 25 MG tablet Take 1 tablet (25 mg total) by mouth 2 (two) times daily. 180 tablet 1   No current facility-administered medications on file prior to visit.    Past Medical History:  Diagnosis Date  . Adenomatous polyp of colon 11/1996   Dr Fuller Plan   . Anxiety   . BPH (benign prostatic hyperplasia)   . Cervical spinal stenosis   . CVA (cerebral vascular accident) ([place])   . Diabetes mellitus, type 2 ([place])   . GERD (gastroesophageal reflux disease)   . Hearing loss   . Hemorrhoids   . Hypertension   . IBS (irritable bowel syndrome)   . Melanoma ([place])   . Nephrolithiasis     X 4    Past Surgical History:  Procedure Laterality Date  . CARDIOVERSION N/A 04/13/2019   Procedure: CARDIOVERSION;  Surgeon: Buford Dresser, MD;  Location: [facility] ENDOSCOPY;  Service: Cardiovascular;  Laterality: N/A;  . CATARACT EXTRACTION Left   . CHOLECYSTECTOMY    . COLONOSCOPY W/ POLYPECTOMY     Dr.Stark  . CYSTOSCOPY     Dr.Kimbrough     Social History   Socioeconomic History  . Marital status: Married    Spouse name: Not on file  . Number of children: 2  . Years of education: Not on file  . Highest education level: Not on file  Occupational History  . Occupation: retired    Fish farm manager: RETIRED  Tobacco Use  . Smoking status: Never Smoker  . Smokeless tobacco: Never Used  Substance and Sexual Activity  . Alcohol use: No  . Drug use: No  . Sexual activity: Not Currently  Other Topics Concern  . Not on file  Social History Narrative  . Not on file   Social Determinants of Health   Financial Resource Strain:   . Difficulty of Paying Living Expenses: Not on file  Food Insecurity:   . Worried About Charity fundraiser in the Last Year: Not on file  . Ran Out of Food in the Last Year: Not on file  Transportation Needs:   . Lack of Transportation (Medical): Not on file  . Lack of Transportation (Non-Medical): Not on file  Physical Activity:   . Days of Exercise per Week: Not on file  . Minutes of Exercise per Session: Not on file  Stress:   . Feeling of Stress : Not on file  Social Connections:   . Frequency of Communication with Friends and Family: Not on file  . Frequency of Social Gatherings with Friends and Family: Not  on file  . Attends Religious Services: Not on file  . Active Member of Clubs or Organizations: Not on file  . Attends Archivist Meetings: Not on file  . Marital Status: Not on file    Family History  Problem Relation Age of Onset  . Alcohol abuse Father   . Heart disease Father   . Heart attack Maternal Grandfather 65  . Diabetes type I Other        grandson  . Cancer Neg Hx   . Stroke Neg Hx     Review of Systems  Constitutional: Negative for chills and fever.  HENT: Positive for congestion and sinus pressure. Negative for sinus pain and sore throat.   Respiratory: Positive for cough. Negative for shortness of breath and wheezing.   Cardiovascular: Negative for chest pain, palpitations and leg swelling.  Neurological: Negative for dizziness, light-headedness and headaches.  Hematological: Does not bruise/bleed easily.       Objective:   Vitals:   11/24/19 0746  BP: 126/80  Pulse: 76  Resp: 16  Temp: 98.1 F (36.7 C)  SpO2: 99%   BP Readings from Last 3 Encounters:  11/24/19 126/80  10/06/19 (!) 142/84  08/24/19 (!) 144/72   Wt Readings from Last 3 Encounters:  11/24/19 161 lb (73 kg)  08/24/19 163 lb (73.9 kg)  07/18/19 167 lb (75.8 kg)   Body mass index is 23.1 kg/m.   Physical Exam    Constitutional: Appears well-developed and well-nourished. No distress.  HENT:  Head: Normocephalic and atraumatic.  Neck: Neck supple. No tracheal deviation present. No thyromegaly present.  No cervical lymphadenopathy Mouth: No oropharynx erythema, mucous membranes moist Cardiovascular: Normal rate, regular rhythm and normal heart sounds.   No murmur heard. No carotid bruit .  No edema Pulmonary/Chest: Effort normal and breath sounds normal. No respiratory distress. No has no wheezes. No rales.  Skin: Skin is warm and dry. Not diaphoretic.  Psychiatric: Normal mood and affect. Behavior is normal.      Assessment & Plan:    See Problem List for  Assessment and Plan of chronic medical problems.    This visit occurred during the SARS-CoV-2 public health emergency.  Safety protocols were in place, including screening questions prior to the visit, additional usage of staff PPE, and extensive cleaning of exam room while observing appropriate contact time as indicated for disinfecting solutions.

## 2019-11-24 NOTE — Assessment & Plan Note (Addendum)
Chronic No concerning symptoms, in sinus rhythm here today Following with Dr Curt Bears On eliquis, cardizem, multaq Cbc, cmp

## 2019-11-24 NOTE — Assessment & Plan Note (Signed)
Acute Symptoms concerning for sinus infection Will start Augmentin twice daily He will let me know if his symptoms do not improve

## 2019-11-24 NOTE — Assessment & Plan Note (Signed)
Chronic  Clinically euthyroid Check tsh  Titrate med dose if needed  

## 2019-11-24 NOTE — Assessment & Plan Note (Signed)
Chronic Check lipid panel, cmp, tsh Continue daily statin Regular exercise and healthy diet encouraged  

## 2019-11-24 NOTE — Assessment & Plan Note (Signed)
Chronic, intermittent Controlled, stable Continue ativan prn

## 2019-11-25 LAB — HEMOGLOBIN A1C: Hgb A1c MFr Bld: 6.4 % (ref 4.6–6.5)

## 2019-11-28 ENCOUNTER — Telehealth: Payer: Self-pay | Admitting: Internal Medicine

## 2019-11-28 NOTE — Telephone Encounter (Signed)
New message:   Pt's wife states she is returning a call from earlier today.

## 2019-11-28 NOTE — Telephone Encounter (Signed)
Patient's wife returning a call to [facility] for results.

## 2019-11-28 NOTE — Telephone Encounter (Signed)
Wife aware of results.

## 2019-12-08 ENCOUNTER — Other Ambulatory Visit: Payer: Self-pay

## 2019-12-08 ENCOUNTER — Encounter: Payer: Self-pay | Admitting: Podiatry

## 2019-12-08 ENCOUNTER — Ambulatory Visit (INDEPENDENT_AMBULATORY_CARE_PROVIDER_SITE_OTHER): Payer: Medicare Other | Admitting: Podiatry

## 2019-12-08 DIAGNOSIS — L97511 Non-pressure chronic ulcer of other part of right foot limited to breakdown of skin: Secondary | ICD-10-CM

## 2019-12-08 DIAGNOSIS — D689 Coagulation defect, unspecified: Secondary | ICD-10-CM

## 2019-12-08 DIAGNOSIS — E1142 Type 2 diabetes mellitus with diabetic polyneuropathy: Secondary | ICD-10-CM

## 2019-12-08 DIAGNOSIS — L02611 Cutaneous abscess of right foot: Secondary | ICD-10-CM

## 2019-12-08 DIAGNOSIS — L03031 Cellulitis of right toe: Secondary | ICD-10-CM | POA: Diagnosis not present

## 2019-12-08 MED ORDER — MUPIROCIN 2 % EX OINT: TOPICAL_OINTMENT | CUTANEOUS | 1 refills | Status: DC

## 2019-12-08 MED ORDER — CEPHALEXIN 500 MG PO CAPS: 500.0000 mg | ORAL_CAPSULE | Freq: Three times a day (TID) | ORAL | 1 refills | Status: DC

## 2019-12-08 NOTE — Progress Notes (Signed)
He presents today with a chief complaint of a painful hammertoe second right.  States that his wounds been there for about 2 weeks states that he does a lot of walking he has been using Neosporin on it daily and is a little bit tender.  Objective: Vital signs are stable alert and oriented x3.  Pulses are palpable but significantly diminished capillary fill time is present but his feet are cool to the touch.  There is minimal hair growth.  Very superficial ulceration dorsal aspect of the PIPJ which is rigid in nature no purulence no malodor does not probe deep mild area of reactive hyperkeratosis which are debrided today no bleeding mild surrounding cellulitis not extending to the level of the metatarsophalangeal joint.  Assessment: Rigid hammertoe deformity with peripheral vascular disease superficial ulceration second digit right foot.  Plan: Discussed etiology pathology conservative surgical therapies at this point we will start him on Keflex 500 mg 1 p.o. 3 times daily x10 days.  We are also going to start him on Bactroban apply twice daily after soaking Epson salts and warm water cover with a Band-Aid and then a Darco shoe.  I will follow-up with him in 2 weeks to make sure he is doing well should his infection worsen or if he notices that the wound is getting deeper he is to notify us immediately.  I would give this 2 weeks for turnaround before sending to vascular.

## 2019-12-15 ENCOUNTER — Telehealth: Payer: Self-pay | Admitting: Internal Medicine

## 2019-12-15 NOTE — Telephone Encounter (Signed)
Spoke with Mr. Brett Franklin regarding AWV, he stated that he will give the office a call later this year 2021 to schedule his wellness visit. SF

## 2019-12-22 ENCOUNTER — Ambulatory Visit (INDEPENDENT_AMBULATORY_CARE_PROVIDER_SITE_OTHER): Payer: Medicare Other | Admitting: Podiatry

## 2019-12-22 ENCOUNTER — Encounter: Payer: Self-pay | Admitting: Podiatry

## 2019-12-22 DIAGNOSIS — E1142 Type 2 diabetes mellitus with diabetic polyneuropathy: Secondary | ICD-10-CM

## 2019-12-22 DIAGNOSIS — L97511 Non-pressure chronic ulcer of other part of right foot limited to breakdown of skin: Secondary | ICD-10-CM

## 2019-12-22 DIAGNOSIS — D689 Coagulation defect, unspecified: Secondary | ICD-10-CM

## 2019-12-22 NOTE — Progress Notes (Signed)
He presents today for a follow-up of his ulceration second digit of the right foot states that seems to be doing a lot better continues to apply the Bactroban ointment and so daily.  Objective: Vital signs are stable alert and oriented x3.  There is no erythema edema cellulitis drainage or odor ulceration to the second toe appears to be healing very nicely is some reduced flow to 50% overlying the PIPJ.  Assessment: Resolving ulceration second digit right foot.  Plan: Debrided the ulceration today debrided reactive hyperkeratoses continue current therapies on her follow-up with him in about 2 weeks.  He will continue the use of the Darco shoe until completely healed.

## 2020-01-02 ENCOUNTER — Telehealth: Payer: Self-pay | Admitting: Internal Medicine

## 2020-01-02 NOTE — Telephone Encounter (Signed)
New message:   1.Medication Requested: dronedarone (MULTAQ) 400 MG tablet 2. Pharmacy (Name, [place]): [facility], [address] AT [place] 3. On Med List: yes  4. Last Visit with PCP: 08/24/19  5. Next visit date with PCP:  Pt wife states that Dr. Quay Burow agreed to fill this medication the last time they spoke. Agent: Please be advised that RX refills may take up to 3 business days. We ask that you follow-up with your pharmacy.

## 2020-01-02 NOTE — Telephone Encounter (Signed)
I do not remember agreeing to fill this medication.  This is a very specialized medication that only cardiology should fill.

## 2020-01-03 ENCOUNTER — Other Ambulatory Visit: Payer: Self-pay

## 2020-01-03 MED ORDER — MULTAQ 400 MG PO TABS: 400.0000 mg | ORAL_TABLET | Freq: Two times a day (BID) | ORAL | 5 refills | Status: DC

## 2020-01-03 NOTE — Telephone Encounter (Signed)
Pt aware of Dr. Quay Burow response below.

## 2020-01-03 NOTE — Telephone Encounter (Signed)
F/u   Patient wife calling back to speak with CMA on yesterday message

## 2020-01-05 ENCOUNTER — Other Ambulatory Visit: Payer: Self-pay | Admitting: Internal Medicine

## 2020-01-06 ENCOUNTER — Other Ambulatory Visit: Payer: Self-pay

## 2020-01-06 ENCOUNTER — Ambulatory Visit (INDEPENDENT_AMBULATORY_CARE_PROVIDER_SITE_OTHER): Payer: Medicare Other | Admitting: Podiatry

## 2020-01-06 ENCOUNTER — Encounter: Payer: Self-pay | Admitting: Podiatry

## 2020-01-06 VITALS — Temp 96.9°F

## 2020-01-06 DIAGNOSIS — D689 Coagulation defect, unspecified: Secondary | ICD-10-CM | POA: Diagnosis not present

## 2020-01-06 DIAGNOSIS — L97511 Non-pressure chronic ulcer of other part of right foot limited to breakdown of skin: Secondary | ICD-10-CM | POA: Diagnosis not present

## 2020-01-06 DIAGNOSIS — B351 Tinea unguium: Secondary | ICD-10-CM

## 2020-01-06 DIAGNOSIS — M79676 Pain in unspecified toe(s): Secondary | ICD-10-CM

## 2020-01-06 DIAGNOSIS — E1142 Type 2 diabetes mellitus with diabetic polyneuropathy: Secondary | ICD-10-CM

## 2020-01-06 LAB — HM DIABETES FOOT EXAM

## 2020-01-06 NOTE — Progress Notes (Signed)
This patient returns to my office for at risk foot care.  This patient requires this care by a professional since this patient will be at risk due to having diabetes.  He has been under the care of Dr.  Milinda Pointer for open wound second toe right foot. Patient says the toe improves but after soaking his foot it worsens again.  Patient denies pain in toe. Patient presents to the office with his wife.   This patient is unable to cut nails himself since the patient cannot reach his nails.These nails are painful walking and wearing shoes.  This patient presents for at risk foot care today.  General Appearance  Alert, conversant and in no acute stress.  Vascular  Dorsalis pedis and posterior tibial  pulses are weakly  palpable  bilaterally.  Capillary return is within normal limits  bilaterally. Temperature is within normal limits  bilaterally.  Neurologic  Senn-Weinstein monofilament wire test within normal limits  bilaterally. Muscle power within normal limits bilaterally.  Nails Thick disfigured discolored nails with subungual debris  from hallux to fifth toes bilaterally. No evidence of bacterial infection or drainage bilaterally.  Orthopedic  No limitations of motion  feet .  No crepitus or effusions noted.  No bony pathology or digital deformities noted.  Hallux malleus  IPJ right.  Hammer toe second toe right foot.    Skin  normotropic skin with no porokeratosis noted bilaterally.  Ulcer noted on PIPJ second toe right foot.   Ulcer is 3 mm x 3 mm over PIPJ.  No redness or swelling or drainage noted.     Onychomycosis  Pain in right toes  Pain in left toes.  Ulcer second toe right foot.  Consent was obtained for treatment procedures.   Mechanical debridement of nails 1-5  bilaterally performed with a nail nipper.  Filed with dremel without incident.  Discussed second toe ulcer with this patient and his wife.  Patient was told to peroxide second toe right and allow the ulcer to dry.  Patient was leaving  and I noted his surgical shoe was too long and the shoe cover was rubbing on the ulcer.  Patient was then dispensed a smaller shoes which he says feels much better.     Return office visit   3 months for preventatove foot care services.  If toe worsens he is to call the office for immediate evaluation.                  Told patient to return for periodic foot care and evaluation due to potential at risk complications.   Gardiner Barefoot DPM

## 2020-01-07 ENCOUNTER — Encounter: Payer: Self-pay | Admitting: Internal Medicine

## 2020-01-17 ENCOUNTER — Other Ambulatory Visit: Payer: Self-pay

## 2020-01-17 ENCOUNTER — Encounter: Payer: Self-pay | Admitting: Cardiology

## 2020-01-17 ENCOUNTER — Ambulatory Visit (INDEPENDENT_AMBULATORY_CARE_PROVIDER_SITE_OTHER): Payer: Medicare Other | Admitting: Cardiology

## 2020-01-17 VITALS — BP 118/64 | HR 61 | Ht 70.0 in | Wt 151.0 lb

## 2020-01-17 DIAGNOSIS — I4819 Other persistent atrial fibrillation: Secondary | ICD-10-CM | POA: Diagnosis not present

## 2020-01-17 NOTE — Progress Notes (Signed)
Electrophysiology Office Note   Date:  01/17/2020   ID:  QUAVION BOULE, DOB 1926/03/14, MRN [id number]  PCP:  Binnie Rail, MD  Cardiologist:   Primary Electrophysiologist:  Will Meredith Leeds, MD    No chief complaint on file.    History of Present Illness: Brett Franklin is a 86 y.o. male who is being seen today for the evaluation of AF/flutter at the request of Burns, Claudina Lick, MD. Presenting today for electrophysiology evaluation.  He has a history of hypertension.  He presented to his primary physician last Friday after finding that his heart rate was fast and his blood pressure was low on his home blood pressure cuff.  His heart rates reportedly were in the 140s.  His primary physician found him to be in atrial fibrillation.  He did have weakness and fatigue at that time.  His metoprolol dose was increased to 25 mg twice a day.  He comes into clinic today reporting mild levels of fatigue.  He is tachycardic with heart rates in the 120s.  He had a cardioversion on 04/13/2019, but went back into atrial fibrillation and presented to the emergency room on 06/01/2019.  He converted to sinus rhythm with IV metoprolol.  He has since been started on Multaq.  Today, denies symptoms of palpitations, chest pain, shortness of breath, orthopnea, PND, lower extremity edema, claudication, dizziness, presyncope, syncope, bleeding, or neurologic sequela. The patient is tolerating medications without difficulties.  Overall he is doing well.  He has no chest pain or shortness of breath.  He is able to do all of his daily activities without restriction.  He is having some right toe issues and is wearing a brace.  Aside from that he has no complaints.   Past Medical History:  Diagnosis Date  . Adenomatous polyp of colon 11/1996   Dr Fuller Plan  . Anxiety   . BPH (benign prostatic hyperplasia)   . Cervical spinal stenosis   . CVA (cerebral vascular accident) ([place])   . Diabetes mellitus, type 2 ([place])   .  GERD (gastroesophageal reflux disease)   . Hearing loss   . Hemorrhoids   . Hypertension   . IBS (irritable bowel syndrome)   . Melanoma ([place])   . Nephrolithiasis     X 4   Past Surgical History:  Procedure Laterality Date  . CARDIOVERSION N/A 04/13/2019   Procedure: CARDIOVERSION;  Surgeon: Buford Dresser, MD;  Location: [facility] ENDOSCOPY;  Service: Cardiovascular;  Laterality: N/A;  . CATARACT EXTRACTION Left   . CHOLECYSTECTOMY    . COLONOSCOPY W/ POLYPECTOMY     Dr.Stark  . CYSTOSCOPY     Dr.Kimbrough      Current Outpatient Medications  Medication Sig Dispense Refill  . acetaminophen (TYLENOL) 650 MG CR tablet Take 650 mg by mouth every 8 (eight) hours as needed for pain.    Marland Kitchen apixaban (ELIQUIS) 5 MG TABS tablet Take 1 tablet (5 mg total) by mouth 2 (two) times daily. 60 tablet 5  . blood glucose meter kit and supplies KIT Dispense based on patient and insurance preference. Use to text blood sugars daily. 1 each 0  . cephALEXin (KEFLEX) 500 MG capsule Take 1 capsule (500 mg total) by mouth 3 (three) times daily. 30 capsule 1  . diltiazem (CARDIZEM CD) 120 MG 24 hr capsule TAKE 1 CAPSULE(120 MG) BY MOUTH DAILY 90 capsule 0  . diltiazem (CARDIZEM) 30 MG tablet TAKE 1 TABLET BY MOUTH EVERY 6 HOURS AS  NEEDED FOR FAST HEART RATE(ATRIAL FIBRILLATION) EPISODES 45 tablet 2  . dronedarone (MULTAQ) 400 MG tablet Take 1 tablet (400 mg total) by mouth 2 (two) times daily with a meal. 60 tablet 5  . glucose blood test strip Use to test blood sugars once daily. Dx code-E11.49 100 each 3  . JANUMET 50-500 MG tablet TAKE 1 TABLET BY MOUTH DAILY 90 tablet 0  . Lancets (FREESTYLE) lancets Use to check blood sugars daily. 100 each 1  . levothyroxine (SYNTHROID) 25 MCG tablet TAKE 1 TABLET(25 MCG) BY MOUTH DAILY BEFORE BREAKFAST 90 tablet 10  . LORazepam (ATIVAN) 0.5 MG tablet Take 1 tablet (0.5 mg total) by mouth every 12 (twelve) hours as needed for anxiety. 20 tablet 1  . mometasone  (NASONEX) 50 MCG/ACT nasal spray SMARTSIG:2 Spray(s) Both Nares Every Night    . mupirocin ointment (BACTROBAN) 2 % Apply to wound after soaking BID 30 g 1  . polyethylene glycol (MIRALAX / GLYCOLAX) 17 g packet Take 17 g by mouth daily.    . pravastatin (PRAVACHOL) 20 MG tablet TAKE 1 TABLET(20 MG) BY MOUTH DAILY 90 tablet 1   No current facility-administered medications for this visit.    Allergies:   Lisinopril   Social History:  The patient  reports that he has never smoked. He has never used smokeless tobacco. He reports that he does not drink alcohol or use drugs.   Family History:  The patient's family history includes Alcohol abuse in his father; Diabetes type I in an other family member; Heart attack (age of onset: 53) in his maternal grandfather; Heart disease in his father.   ROS:  Please see the history of present illness.   Otherwise, review of systems is positive for none.   All other systems are reviewed and negative.   PHYSICAL EXAM: VS:  BP 118/64   Pulse 61   Ht 5' 10" (1.778 m)   Wt 151 lb (68.5 kg)   SpO2 97%   BMI 21.67 kg/m  , BMI Body mass index is 21.67 kg/m. GEN: Well nourished, well developed, in no acute distress  HEENT: normal  Neck: no JVD, carotid bruits, or masses Cardiac: RRR; no murmurs, rubs, or gallops,no edema  Respiratory:  clear to auscultation bilaterally, normal work of breathing GI: soft, nontender, nondistended, + BS MS: no deformity or atrophy  Skin: warm and dry Neuro:  Strength and sensation are intact Psych: euthymic mood, full affect  EKG:  EKG is ordered today. Personal review of the ekg ordered shows sinus rhythm, rate 61  Recent Labs: 11/24/2019: ALT 12; BUN 20; Creatinine, Ser 1.14; Hemoglobin 13.7; Platelets 220.0; Potassium 4.3; Sodium 138; TSH 3.06    Lipid Panel     Component Value Date/Time   CHOL 106 11/24/2019 0813   TRIG 38.0 11/24/2019 0813   TRIG 36 09/04/2006 0915   HDL 54.90 11/24/2019 0813   CHOLHDL 2  11/24/2019 0813   VLDL 7.6 11/24/2019 0813   LDLCALC 44 11/24/2019 0813     Wt Readings from Last 3 Encounters:  01/17/20 151 lb (68.5 kg)  11/24/19 161 lb (73 kg)  08/24/19 163 lb (73.9 kg)      Other studies Reviewed: Additional studies/ records that were reviewed today include: PCP records  ASSESSMENT AND PLAN:  1.  Persistent atrial fibrillation/flutter: Currently on Multaq and Eliquis.  CHA2DS2-VASc of 6.  Fortunately he is remained in sinus rhythm since starting his antiarrhythmic.  No changes at this time.  2.  Hypertension: Currently well controlled   Current medicines are reviewed at length with the patient today.   The patient does not have concerns regarding his medicines.  The following changes were made today: None  Labs/ tests ordered today include:  Orders Placed This Encounter  Procedures  . EKG 12-Lead  . EKG 12-Lead     Disposition:   FU with Will Camnitz 6 months  Signed, Will Meredith Leeds, MD  01/17/2020 10:11 AM     [facility] [address] [place] [address] [phone] (office) [phone] (fax)

## 2020-01-17 NOTE — Patient Instructions (Signed)
Medication Instructions:  Your physician recommends that you continue on your current medications as directed. Please refer to the Current Medication list given to you today.  *If you need a refill on your cardiac medications before your next appointment, please call your pharmacy*   Lab Work: None ordered If you have labs (blood work) drawn today and your tests are completely normal, you will receive your results only by: . MyChart Message (if you have MyChart) OR . A paper copy in the mail If you have any lab test that is abnormal or we need to change your treatment, we will call you to review the results.   Testing/Procedures: None ordered   Follow-Up: At [facility], you and your health needs are our priority.  As part of our continuing mission to provide you with exceptional heart care, we have created designated Provider Care Teams.  These Care Teams include your primary Cardiologist (physician) and Advanced Practice Providers (APPs -  Physician Assistants and Nurse Practitioners) who all work together to provide you with the care you need, when you need it.  We recommend signing up for the patient portal called "MyChart".  Sign up information is provided on this After Visit Summary.  MyChart is used to connect with patients for Virtual Visits (Telemedicine).  Patients are able to view lab/test results, encounter notes, upcoming appointments, etc.  Non-urgent messages can be sent to your provider as well.   To learn more about what you can do with MyChart, go to [email or web address].    Your next appointment:   6 month(s)  The format for your next appointment:   In Person  Provider:   Will Camnitz, MD   Thank you for choosing [facility]!!   Caran Storck, RN [phone]    Other Instructions    

## 2020-01-19 ENCOUNTER — Ambulatory Visit (INDEPENDENT_AMBULATORY_CARE_PROVIDER_SITE_OTHER): Payer: Medicare Other | Admitting: Podiatry

## 2020-01-19 ENCOUNTER — Other Ambulatory Visit: Payer: Self-pay

## 2020-01-19 ENCOUNTER — Encounter: Payer: Self-pay | Admitting: Podiatry

## 2020-01-19 DIAGNOSIS — L97511 Non-pressure chronic ulcer of other part of right foot limited to breakdown of skin: Secondary | ICD-10-CM | POA: Diagnosis not present

## 2020-01-19 NOTE — Progress Notes (Signed)
He presents today for follow-up of ulceration second toe of the right foot.  States that I think it is almost done.  He is doing so much better.  It does not hurt anymore.  Wants to know if he can get back to his regular shoes.  Objective: Vital signs are stable he is alert and oriented x3 there is no erythema edema cellulitis drainage or odor wound is closed to the dorsal aspect of the PIPJ second digit right foot but with some severe hammertoe deformity still present.  No other open lesions are noted.  Assessment: Well-healed ulcerative lesion second toe right foot.  Plan: I will allow him to get back to regular shoe gear provided he will wear his silicone padding which I provided him today he understands and is amenable to it we will follow-up with me in 3 to 4 weeks to make sure that he is staying healed.  At that time we will get him back to Dr. Prudence Davidson.

## 2020-01-22 ENCOUNTER — Other Ambulatory Visit: Payer: Self-pay | Admitting: Cardiology

## 2020-01-25 ENCOUNTER — Telehealth: Payer: Self-pay | Admitting: Cardiology

## 2020-01-25 MED ORDER — DILTIAZEM HCL ER COATED BEADS 120 MG PO CP24: ORAL_CAPSULE | ORAL | 3 refills | Status: DC

## 2020-01-25 NOTE — Telephone Encounter (Signed)
New Message  Pt c/o medication issue:  1. Name of Medication: diltiazem (CARDIZEM CD) 120 MG 24 hr capsule  2. How are you currently taking this medication (dosage and times per day)? As written  3. Are you having a reaction (difficulty breathing--STAT)? No  4. What is your medication issue? Pt is currently out of medicine and needs a new prescription

## 2020-01-25 NOTE — Telephone Encounter (Signed)
Pt's medication was sent to pt's pharmacy as requested. Confirmation received.  °

## 2020-02-06 ENCOUNTER — Other Ambulatory Visit: Payer: Self-pay | Admitting: Internal Medicine

## 2020-02-13 NOTE — Progress Notes (Signed)
Subjective:    Patient ID: Brett Franklin, male    DOB: 19-Jun-1926, 86 y.o.   MRN: [id number]  HPI The patient is here for follow up of their chronic medical problems, including paroxysmal Afib, htn, hypothyroidism, diabetes, hyperlipidemia, anxiety.    He is taking all of his medications as prescribed.    He is exercising regularly.     His balance is fair.  He denies falls.  He is seen podiatry for a hammertoe on his right second toe.  He is considering surgery.  Medications and allergies reviewed with patient and updated if appropriate.  Patient Active Problem List   Diagnosis Date Noted  . Chronic sphenoidal sinusitis 07/06/2019  . Presbycusis of both ears 06/08/2019  . Acquired hypothyroidism 05/19/2019  . Lightheadedness 05/06/2019  . Chronic mycotic otitis externa 04/15/2019  . Atrial flutter ([place])   . Paroxysmal atrial fibrillation ([facility])   . ETD (eustachian tube dysfunction) 02/17/2019  . Poor balance 10/08/2018  . Right foot pain 01/13/2017  . Hyperlipidemia 07/21/2011  . OTHER DYSPHAGIA 03/04/2010  . History of stroke 12/31/2009  . LOW BACK PAIN SYNDROME 11/12/2009  . CERVICAL RADICULOPATHY, LEFT 05/11/2008  . NEPHROLITHIASIS, HX OF 05/11/2008  . Diabetes mellitus with neurological manifestations, controlled ([place]) 11/16/2007  . Anxiety 07/08/2007  . Melanoma of skin ([place]) 01/21/2007  . Essential hypertension 01/21/2007  . IBS 01/21/2007  . BPH (benign prostatic hyperplasia) 01/21/2007  . STENOSIS, CERVICAL SPINAL 01/21/2007  . History of adenomatous polyp of colon 01/21/2007    Current Outpatient Medications on File Prior to Visit  Medication Sig Dispense Refill  . acetaminophen (TYLENOL) 650 MG CR tablet Take 650 mg by mouth every 8 (eight) hours as needed for pain.    . blood glucose meter kit and supplies KIT Dispense based on patient and insurance preference. Use to text blood sugars daily. 1 each 0  . diltiazem (CARDIZEM CD) 120 MG 24 hr capsule  TAKE 1 CAPSULE(120 MG) BY MOUTH DAILY 90 capsule 3  . diltiazem (CARDIZEM) 30 MG tablet TAKE 1 TABLET BY MOUTH EVERY 6 HOURS AS NEEDED FOR FAST HEART RATE(ATRIAL FIBRILLATION) EPISODES 45 tablet 2  . dronedarone (MULTAQ) 400 MG tablet Take 1 tablet (400 mg total) by mouth 2 (two) times daily with a meal. 60 tablet 5  . glucose blood test strip Use to test blood sugars once daily. Dx code-E11.49 100 each 3  . JANUMET 50-500 MG tablet TAKE 1 TABLET BY MOUTH DAILY 90 tablet 1  . Lancets (FREESTYLE) lancets Use to check blood sugars daily. 100 each 1  . levothyroxine (SYNTHROID) 25 MCG tablet TAKE 1 TABLET(25 MCG) BY MOUTH DAILY BEFORE BREAKFAST 90 tablet 10  . mometasone (NASONEX) 50 MCG/ACT nasal spray SMARTSIG:2 Spray(s) Both Nares Every Night    . mupirocin ointment (BACTROBAN) 2 % Apply to wound after soaking BID 30 g 1  . polyethylene glycol (MIRALAX / GLYCOLAX) 17 g packet Take 17 g by mouth daily.    . pravastatin (PRAVACHOL) 20 MG tablet TAKE 1 TABLET(20 MG) BY MOUTH DAILY 90 tablet 1  . [DISCONTINUED] metoprolol tartrate (LOPRESSOR) 25 MG tablet Take 1 tablet (25 mg total) by mouth 2 (two) times daily. 180 tablet 1   No current facility-administered medications on file prior to visit.    Past Medical History:  Diagnosis Date  . Adenomatous polyp of colon 11/1996   Dr Fuller Plan  . Anxiety   . BPH (benign prostatic hyperplasia)   . Cervical spinal  stenosis   . CVA (cerebral vascular accident) ([place])   . Diabetes mellitus, type 2 ([place])   . GERD (gastroesophageal reflux disease)   . Hearing loss   . Hemorrhoids   . Hypertension   . IBS (irritable bowel syndrome)   . Melanoma ([place])   . Nephrolithiasis     X 4    Past Surgical History:  Procedure Laterality Date  . CARDIOVERSION N/A 04/13/2019   Procedure: CARDIOVERSION;  Surgeon: Buford Dresser, MD;  Location: [facility] ENDOSCOPY;  Service: Cardiovascular;  Laterality: N/A;  . CATARACT EXTRACTION Left   . CHOLECYSTECTOMY    .  COLONOSCOPY W/ POLYPECTOMY     Dr.Stark  . CYSTOSCOPY     Dr.Kimbrough     Social History   Socioeconomic History  . Marital status: Married    Spouse name: Not on file  . Number of children: 2  . Years of education: Not on file  . Highest education level: Not on file  Occupational History  . Occupation: retired    Fish farm manager: RETIRED  Tobacco Use  . Smoking status: Never Smoker  . Smokeless tobacco: Never Used  Substance and Sexual Activity  . Alcohol use: No  . Drug use: No  . Sexual activity: Not Currently  Other Topics Concern  . Not on file  Social History Narrative  . Not on file   Social Determinants of Health   Financial Resource Strain:   . Difficulty of Paying Living Expenses:   Food Insecurity:   . Worried About Charity fundraiser in the Last Year:   . Arboriculturist in the Last Year:   Transportation Needs:   . Film/video editor (Medical):   Marland Kitchen Lack of Transportation (Non-Medical):   Physical Activity:   . Days of Exercise per Week:   . Minutes of Exercise per Session:   Stress:   . Feeling of Stress :   Social Connections:   . Frequency of Communication with Friends and Family:   . Frequency of Social Gatherings with Friends and Family:   . Attends Religious Services:   . Active Member of Clubs or Organizations:   . Attends Archivist Meetings:   Marland Kitchen Marital Status:     Family History  Problem Relation Age of Onset  . Alcohol abuse Father   . Heart disease Father   . Heart attack Maternal Grandfather 65  . Diabetes type I Other        grandson  . Cancer Neg Hx   . Stroke Neg Hx     Review of Systems  Constitutional: Negative for chills, fatigue and fever.  HENT: Negative for postnasal drip.   Respiratory: Positive for cough (and throat clearing - after eating, sitting). Negative for shortness of breath and wheezing.   Cardiovascular: Negative for chest pain, palpitations and leg swelling.  Gastrointestinal:       No gerd    Neurological: Negative for light-headedness and headaches.       Objective:   Vitals:   02/14/20 0915  BP: (!) 142/64  Pulse: 60  Resp: 16  Temp: 97.8 F (36.6 C)  SpO2: 99%   BP Readings from Last 3 Encounters:  02/14/20 (!) 142/64  01/17/20 118/64  11/24/19 126/80   Wt Readings from Last 3 Encounters:  02/14/20 160 lb (72.6 kg)  01/17/20 151 lb (68.5 kg)  11/24/19 161 lb (73 kg)   Body mass index is 22.96 kg/m.   Physical Exam  Constitutional: Appears well-developed and well-nourished. No distress.  HENT:  Head: Normocephalic and atraumatic.  Neck: Neck supple. No tracheal deviation present. No thyromegaly present.  No cervical lymphadenopathy Cardiovascular: Normal rate, regular rhythm and normal heart sounds.   No murmur heard. No carotid bruit .  No edema Pulmonary/Chest: Effort normal and breath sounds normal. No respiratory distress. No has no wheezes. No rales.  Skin: Skin is warm and dry. Not diaphoretic.  Psychiatric: Normal mood and affect. Behavior is normal.      Assessment & Plan:    See Problem List for Assessment and Plan of chronic medical problems.    This visit occurred during the SARS-CoV-2 public health emergency.  Safety protocols were in place, including screening questions prior to the visit, additional usage of staff PPE, and extensive cleaning of exam room while observing appropriate contact time as indicated for disinfecting solutions.

## 2020-02-13 NOTE — Patient Instructions (Addendum)
  Medications reviewed and updated.  Changes include :   none    Please followup in 3 months   

## 2020-02-14 ENCOUNTER — Encounter: Payer: Self-pay | Admitting: Internal Medicine

## 2020-02-14 ENCOUNTER — Other Ambulatory Visit: Payer: Self-pay

## 2020-02-14 ENCOUNTER — Ambulatory Visit (INDEPENDENT_AMBULATORY_CARE_PROVIDER_SITE_OTHER): Payer: Medicare Other | Admitting: Internal Medicine

## 2020-02-14 ENCOUNTER — Ambulatory Visit (INDEPENDENT_AMBULATORY_CARE_PROVIDER_SITE_OTHER): Payer: Medicare Other

## 2020-02-14 VITALS — BP 142/64 | HR 60 | Temp 97.8°F | Resp 16 | Ht 70.0 in | Wt 160.0 lb

## 2020-02-14 VITALS — BP 142/64 | HR 60 | Temp 97.8°F | Ht 70.0 in | Wt 160.0 lb

## 2020-02-14 DIAGNOSIS — E1149 Type 2 diabetes mellitus with other diabetic neurological complication: Secondary | ICD-10-CM

## 2020-02-14 DIAGNOSIS — I480 Paroxysmal atrial fibrillation: Secondary | ICD-10-CM | POA: Diagnosis not present

## 2020-02-14 DIAGNOSIS — E039 Hypothyroidism, unspecified: Secondary | ICD-10-CM

## 2020-02-14 DIAGNOSIS — Z0000 Encounter for general adult medical examination without abnormal findings: Secondary | ICD-10-CM | POA: Diagnosis not present

## 2020-02-14 DIAGNOSIS — E7849 Other hyperlipidemia: Secondary | ICD-10-CM

## 2020-02-14 DIAGNOSIS — F419 Anxiety disorder, unspecified: Secondary | ICD-10-CM

## 2020-02-14 DIAGNOSIS — I10 Essential (primary) hypertension: Secondary | ICD-10-CM

## 2020-02-14 MED ORDER — APIXABAN 5 MG PO TABS: 5.0000 mg | ORAL_TABLET | Freq: Two times a day (BID) | ORAL | 1 refills | Status: DC

## 2020-02-14 MED ORDER — LORAZEPAM 0.5 MG PO TABS: 0.5000 mg | ORAL_TABLET | Freq: Two times a day (BID) | ORAL | 1 refills | Status: DC | PRN

## 2020-02-14 NOTE — Assessment & Plan Note (Addendum)
Chronic Sugars have been controlled-Will recheck A1c in 3 months Continue janumet Low sugar / carb diet Stressed regular exercise

## 2020-02-14 NOTE — Assessment & Plan Note (Signed)
Chronic  Clinically euthyroid Continue current dose of medication 

## 2020-02-14 NOTE — Progress Notes (Signed)
Subjective:   Brett Franklin is a 86 y.o. male who presents for Medicare Annual/Subsequent preventive examination.  Review of Systems:  No ROS. Medicare Wellness Visit. Additional risk factors are reflected in social history. Cardiac Risk Factors include: advanced age (>39mn, >>32women);dyslipidemia;family history of premature cardiovascular disease;hypertension;male gender  Sleep Patterns: Issues falling asleep, like to sleep in the recliner; feels rested on waking and sleeps 8 hours nightly; gets up 1-2 times nightly to void. Home Safety/Smoke Alarms: Feels safe in home; uses home alarm. Smoke alarms in place. Living environment: 1-story home; Lives with spouse; no needs for DME; good support system. Seat Belt Safety/Bike Helmet: Wears seat belt.  Objective:    Vitals: BP (!) 142/64 (BP Location: Left Arm, Patient Position: Sitting)   Pulse 60   Temp 97.8 F (36.6 C)   Ht _0  (1.778 m)   Wt 160 lb (72.6 kg)   SpO2 99%   BMI 22.96 kg/m   Body mass index is 22.96 kg/m.  Advanced Directives 02/14/2020 06/01/2019 04/13/2019 10/08/2018  Does Patient Have a Medical Advance Directive? Yes No Yes Yes  Type of Advance Directive - - [place] will  Does patient want to make changes to medical advance directive? No - Patient declined - - -  Copy of [place]? - - No - copy requested No - copy requested  Would patient like information on creating a medical advance directive? - No - Patient declined - -    Tobacco Social History   Tobacco Use  Smoking Status Never Smoker  Smokeless Tobacco Never Used     Counseling given: No   Clinical Intake:  Pre-visit preparation completed: Yes  Pain : No/denies pain     BMI - recorded: 22.96 Nutritional Status: BMI of 19-24  Normal Nutritional Risks: None Diabetes: Yes CBG done?: No Did pt. bring in CBG monitor from home?: No  What  is the last grade level you completed in school?: High School Graduate  Interpreter Needed?: No  Information entered by :: Otha Rickles N. HLowell Guitar LPN  Past Medical History:  Diagnosis Date  . Adenomatous polyp of colon 11/1996   Dr SFuller Plan . Anxiety   . BPH (benign prostatic hyperplasia)   . Cervical spinal stenosis   . [place]) [place]   . Diabetes [place], type 2 ([place]   . GERD (gastroesophageal reflux disease)   . Hearing loss   . Hemorrhoids   . Hypertension   . IBS (irritable bowel syndrome)   . Melanoma ([place]   . Nephrolithiasis     X 4   Past Surgical History:  Procedure Laterality Date  . CARDIOVERSION N/A 04/13/2019   Procedure: CARDIOVERSION;  Surgeon: CBuford Dresser MD;  [facility];  Service: Cardiovascular;  Laterality: N/A;  . CATARACT EXTRACTION Left   . CHOLECYSTECTOMY    . COLONOSCOPY W/ POLYPECTOMY     Dr.Stark  . CYSTOSCOPY     Dr.Kimbrough    Family History  Problem Relation Age of Onset  . Alcohol abuse Father   . Heart disease Father   . Heart attack Maternal Grandfather 65  . Diabetes type I Other        grandson  . Cancer Neg Hx   . Stroke Neg Hx    Social History   Socioeconomic History  . Marital status: Married    Spouse name: Not on file  . Number  of children: 2  . Years of education: Not on file  . Highest education level: Not on file  Occupational History  . Occupation: retired    Fish farm manager: RETIRED  Tobacco Use  . Smoking status: Never Smoker  . Smokeless tobacco: Never Used  Substance and Sexual Activity  . Alcohol use: No  . Drug use: No  . Sexual activity: Not Currently  Other Topics Concern  . Not on file  Social History Narrative  . Not on file   Social Determinants of Health   Financial Resource Strain:   . Difficulty of Paying Living Expenses:   Food Insecurity:   . Worried About Charity fundraiser in the Last Year:   . Arboriculturist in the Last Year:     Transportation Needs:   . Film/video editor (Medical):   Marland Kitchen Lack of Transportation (Non-Medical):   Physical Activity:   . Days of Exercise per Week:   . Minutes of Exercise per Session:   Stress:   . Feeling of Stress :   Social Connections:   . Frequency of Communication with Friends and Family:   . Frequency of Social Gatherings with Friends and Family:   . Attends Religious Services:   . Active Member of Clubs or Organizations:   . Attends Archivist Meetings:   Marland Kitchen Marital Status:     Outpatient Encounter Medications as of 02/14/2020  Medication Sig  . acetaminophen (TYLENOL) 650 MG CR tablet Take 650 mg by mouth every 8 (eight) hours as needed for pain.  Marland Kitchen apixaban (ELIQUIS) 5 MG TABS tablet Take 1 tablet (5 mg total) by mouth 2 (two) times daily.  . blood glucose meter kit and supplies KIT Dispense based on patient and insurance preference. Use to text blood sugars daily.  Marland Kitchen diltiazem (CARDIZEM CD) 120 MG 24 hr capsule TAKE 1 CAPSULE(120 MG) BY MOUTH DAILY  . diltiazem (CARDIZEM) 30 MG tablet TAKE 1 TABLET BY MOUTH EVERY 6 HOURS AS NEEDED FOR FAST HEART RATE(ATRIAL FIBRILLATION) EPISODES  . dronedarone (MULTAQ) 400 MG tablet Take 1 tablet (400 mg total) by mouth 2 (two) times daily with a meal.  . glucose blood test strip Use to test blood sugars once daily. Dx code-E11.49  . JANUMET 50-500 MG tablet TAKE 1 TABLET BY MOUTH DAILY  . Lancets (FREESTYLE) lancets Use to check blood sugars daily.  Marland Kitchen levothyroxine (SYNTHROID) 25 MCG tablet TAKE 1 TABLET(25 MCG) BY MOUTH DAILY BEFORE BREAKFAST  . mometasone (NASONEX) 50 MCG/ACT nasal spray SMARTSIG:2 Spray(s) Both Nares Every Night  . mupirocin ointment (BACTROBAN) 2 % Apply to wound after soaking BID  . polyethylene glycol (MIRALAX / GLYCOLAX) 17 g packet Take 17 g by mouth daily.  . pravastatin (PRAVACHOL) 20 MG tablet TAKE 1 TABLET(20 MG) BY MOUTH DAILY  . [DISCONTINUED] metoprolol tartrate (LOPRESSOR) 25 MG tablet  Take 1 tablet (25 mg total) by mouth 2 (two) times daily.   No facility-administered encounter medications on file as of 02/14/2020.    Activities of Daily Living In your present state of health, do you have any difficulty performing the following activities: 02/14/2020  Hearing? Y  Comment has hearing aids  Vision? N  Difficulty concentrating or making decisions? Y  Comment forgets names  Walking or climbing stairs? N  Dressing or bathing? N  Doing errands, shopping? N  Preparing Food and eating ? N  Using the Toilet? N  In the past six months, have you accidently leaked  urine? N  Do you have problems with loss of bowel control? N  Managing your Medications? N  Managing your Finances? N  Housekeeping or managing your Housekeeping? N  Some recent data might be hidden    Patient Care Team: Binnie Rail, MD as PCP - General (Internal Medicine) Constance Haw, MD as PCP - Electrophysiology (Cardiology)   Assessment:   This is a routine wellness examination for [facility].  Exercise Activities and Dietary recommendations Current Exercise Habits: Home exercise routine(walks everyday at home), Type of exercise: walking, Time (Minutes): 30, Frequency (Times/Week): 5, Weekly Exercise (Minutes/Week): 150, Intensity: Moderate, Exercise limited by: cardiac condition(s);orthopedic condition(s)  Goals    . Patient Stated     Maintain current health status and stay as independent as possible.    . Patient Stated (pt-stated)     To keep living.       Fall Risk Fall Risk  02/14/2020 11/24/2019 03/18/2019 10/08/2018 10/06/2017  Falls in the past year? 0 0 0 0 No  Number falls in past yr: 0 0 0 - -  Injury with Fall? 0 - 0 - -  Risk for fall due to : Impaired balance/gait - - Impaired balance/gait -  Risk for fall due to: Comment - - - referral for PT placed today by PCP -  Follow up Falls evaluation completed;Education provided;Falls prevention discussed - Falls evaluation completed  Falls prevention discussed -   Is the patient's home free of loose throw rugs in walkways, pet beds, electrical cords, etc?   yes      Grab bars in the bathroom? yes      Handrails on the stairs?   yes      Adequate lighting?   yes  Depression Screen PHQ 2/9 Scores 02/14/2020 03/18/2019 10/08/2018 10/06/2017  PHQ - 2 Score 0 0 0 0  PHQ- 9 Score - 0 - -    Cognitive Function MMSE - Mini Mental State Exam 10/08/2018  Orientation to time 5  Orientation to Place 5  Registration 3  Attention/ Calculation 5  Recall 2  Language- name 2 objects 2  Language- repeat 1  Language- follow 3 step command 3  Language- read & follow direction 1  Write a sentence 1  Copy design 1  Total score 29        Immunization History  Administered Date(s) Administered  . Fluad Quad(high Dose 65+) 05/20/2019  . Influenza Split 07/21/2011, 07/08/2012  . Influenza Whole 08/03/2007, 08/05/2010  . Influenza, High Dose Seasonal PF 07/06/2013, 07/22/2016, 06/12/2017, 07/08/2018  . Influenza,inj,Quad PF,6+ Mos 07/18/2014, 08/10/2015  . PFIZER SARS-COV-2 Vaccination 10/19/2019, 11/09/2019  . Pneumococcal Conjugate-13 10/12/2014  . Pneumococcal Polysaccharide-23 09/29/1997  . Td 09/01/1996  . Tdap 05/03/2015  . Zoster 04/16/2009  . Zoster Recombinat (Shingrix) 04/05/2018, 08/13/2018    Qualifies for Shingles Vaccine? Completed   Screening Tests Health Maintenance  Topic Date Due  . OPHTHALMOLOGY EXAM  11/30/2019  . INFLUENZA VACCINE  04/29/2020  . HEMOGLOBIN A1C  05/23/2020  . URINE MICROALBUMIN  11/23/2020  . FOOT EXAM  01/05/2021  . TETANUS/TDAP  05/02/2025  . COVID-19 Vaccine  Completed  . PNA vac Low Risk Adult  Completed   Cancer Screenings: Lung: Low Dose CT Chest recommended if Age 15-80 years, 30 pack-year currently smoking OR have quit w/in 15years. Patient does not qualify. Colorectal: not recommended due to age     Plan:     Reviewed health maintenance screenings with patient today  and  relevant education, vaccines, and/or referrals were provided.    Continue doing brain stimulating activities (puzzles, reading, adult coloring books, staying active) to keep memory sharp.    Continue to eat heart healthy diet (full of fruits, vegetables, whole grains, lean protein, water--limit salt, fat, and sugar intake) and increase physical activity as tolerated.  I have personally reviewed and noted the following in the patient's chart:   . Medical and social history . Use of alcohol, tobacco or illicit drugs  . Current medications and supplements . Functional ability and status . Nutritional status . Physical activity . Advanced directives . List of other physicians . Hospitalizations, surgeries, and ER visits in previous 12 months . Vitals . Screenings to include cognitive, depression, and falls . Referrals and appointments  In addition, I have reviewed and discussed with patient certain preventive protocols, quality metrics, and best practice recommendations. A written personalized care plan for preventive services as well as general preventive health recommendations were provided to patient.     Sheral Flow, LPN  3/35/4562 Nurse Health Advisor

## 2020-02-14 NOTE — Assessment & Plan Note (Signed)
Chronic intermittent Taking ativan prn Controlled Continue ativan prn - refilled today

## 2020-02-14 NOTE — Assessment & Plan Note (Addendum)
Chronic BP controlled Current regimen effective and well tolerated Continue current medications at current doses

## 2020-02-14 NOTE — Assessment & Plan Note (Signed)
Chronic Continue daily statin Regular exercise and healthy diet encouraged  

## 2020-02-14 NOTE — Assessment & Plan Note (Addendum)
Chronic No afib symptoms today Following with dr Curt Bears On eliquis, cardizem, multaq

## 2020-02-21 ENCOUNTER — Other Ambulatory Visit: Payer: Self-pay

## 2020-02-21 ENCOUNTER — Ambulatory Visit (INDEPENDENT_AMBULATORY_CARE_PROVIDER_SITE_OTHER): Payer: Medicare Other | Admitting: Podiatry

## 2020-02-21 VITALS — Temp 97.2°F

## 2020-02-21 DIAGNOSIS — L97511 Non-pressure chronic ulcer of other part of right foot limited to breakdown of skin: Secondary | ICD-10-CM | POA: Diagnosis not present

## 2020-02-22 ENCOUNTER — Ambulatory Visit: Payer: Medicare Other | Admitting: Internal Medicine

## 2020-02-22 NOTE — Progress Notes (Signed)
He presents today for follow-up of his second toe.  He denies fever chills nausea vomiting muscle aches and pains patient's wife stated that he stopped wearing the Band-Aids and is now wearing open toed shoes he is quit the Neosporin.  Objective: Vital signs are stable he is alert oriented x3 hammertoe deformity second digit of the right foot is associated with a superficial ulceration dorsal aspect of the PIPJ which is gone on to heal completely at this point there is just some mild erythema present no cellulitis drainage odor no open wound.  Assessment: Peripheral vascular disease hammertoe deformity resolved ulcer dorsal aspect second toe.  Plan: At this point I feel that he should continue to wear shoes with deep toe boxes or open toes.  I also provided him with some silicone sleeves to help protect the lesion.  Follow-up with me in about 6 weeks just for reevaluation

## 2020-02-29 ENCOUNTER — Other Ambulatory Visit: Payer: Self-pay | Admitting: Internal Medicine

## 2020-03-16 ENCOUNTER — Encounter: Payer: Self-pay | Admitting: Podiatry

## 2020-03-16 ENCOUNTER — Other Ambulatory Visit: Payer: Self-pay

## 2020-03-16 ENCOUNTER — Ambulatory Visit (INDEPENDENT_AMBULATORY_CARE_PROVIDER_SITE_OTHER): Payer: Medicare Other | Admitting: Podiatry

## 2020-03-16 DIAGNOSIS — E1142 Type 2 diabetes mellitus with diabetic polyneuropathy: Secondary | ICD-10-CM | POA: Diagnosis not present

## 2020-03-16 DIAGNOSIS — L600 Ingrowing nail: Secondary | ICD-10-CM | POA: Insufficient documentation

## 2020-03-16 DIAGNOSIS — B351 Tinea unguium: Secondary | ICD-10-CM

## 2020-03-16 DIAGNOSIS — D689 Coagulation defect, unspecified: Secondary | ICD-10-CM

## 2020-03-16 DIAGNOSIS — M79676 Pain in unspecified toe(s): Secondary | ICD-10-CM

## 2020-03-16 NOTE — Patient Instructions (Signed)

## 2020-03-16 NOTE — Progress Notes (Signed)
This patient returns to the office for evaluation and treatment of long thick painful nails .  He has also been under treatment for ulcer second toe right foot.    This patient is unable to trim his own nails since the patient cannot reach his feet.  Patient says the nails are painful walking and wearing his shoes.  He says the right big toe is very painful walking and wearing his shoes.    He returns for preventive foot care services.  Patient is taking eliquis. Patient presents to the office with his wife.  General Appearance  Alert, conversant and in no acute stress.  Vascular  Dorsalis pedis and posterior tibial  pulses are weakly  palpable  bilaterally.  Capillary return is within normal limits  bilaterally. Temperature is within normal limits  bilaterally.  Neurologic  Senn-Weinstein monofilament wire test within normal limits  bilaterally. Muscle power within normal limits bilaterally.  Nails Thick disfigured discolored nails with subungual debris  from hallux to fifth toes bilaterally. No evidence of bacterial infection or drainage bilaterally.  Marked incurvation medial border right hallux.  Orthopedic  No limitations of motion  feet .  No crepitus or effusions noted.  No bony pathology or digital deformities noted.  Hammer toe second right foot which is healing.  Skin  normotropic skin with no porokeratosis noted bilaterally.  No signs of infections or ulcers noted.     Onychomycosis  Pain in toes right foot  Pain in toes left foot  Ingrown toenail medial border right hallux.  Debridement  of nails  1-5  B/L with a nail nipper.  Nails were then filed using a dremel tool with no incidents. As I was performing nail care patient had pain out of proportion medial border right hallux.  He gives a history of previous pain and had his nail along the medial border removed.  He requested having his nail border removed.  I am concerned about his circulatory status and his taking eliquis.  Dr.  March Rummage  recommended the surgery be performed  despite being on eliquis.  I checked his pulses which were weakly palapble. and  his capillary return was immediate.  I agreed to perform a incision and drainage of medial border.  Treatment options and alternatives discussed.  Recommended an incision and drainage and patient agreed.  Right hallux  was prepped with alcohol and a 3cc. of  2% lidocaine plain was administered in a digital block fashion.  The toe was then prepped with betadine solution . Tourniquet was applied to the toe.    The offending nail border was then excised in three separate nail fragments using a hemostat. Care was taken to perform minimal trauma.    The surgical site was then packed to allow clotting to occur.  .    The area was then cleansed  and antibiotic ointment and a dry sterile dressing was applied.  The patient was dispensed instructions for aftercare. RTC 7-10 days for evaluation of nail site to assure healing.    RTC 3 months for nail care.    Gardiner Barefoot DPM

## 2020-03-28 ENCOUNTER — Ambulatory Visit: Payer: Medicare Other | Admitting: Podiatry

## 2020-04-03 ENCOUNTER — Ambulatory Visit (INDEPENDENT_AMBULATORY_CARE_PROVIDER_SITE_OTHER): Payer: Medicare Other | Admitting: Podiatry

## 2020-04-03 ENCOUNTER — Encounter: Payer: Self-pay | Admitting: Podiatry

## 2020-04-03 ENCOUNTER — Other Ambulatory Visit: Payer: Self-pay

## 2020-04-03 DIAGNOSIS — E1142 Type 2 diabetes mellitus with diabetic polyneuropathy: Secondary | ICD-10-CM | POA: Diagnosis not present

## 2020-04-03 DIAGNOSIS — D689 Coagulation defect, unspecified: Secondary | ICD-10-CM

## 2020-04-03 DIAGNOSIS — L97511 Non-pressure chronic ulcer of other part of right foot limited to breakdown of skin: Secondary | ICD-10-CM

## 2020-04-03 NOTE — Progress Notes (Signed)
He presents today for follow-up of ulceration second PIPJ's right foot.  He is also presents for follow-up of matrixectomy tibial border hallux right.  This was performed by Dr. Sharyon Cable.  Objective: Vital signs are stable he is alert oriented x3 there is no erythema edema cellulitis drainage or odor everything appears to be healing very nicely.  He does have a painful reactive hyperkeratotic lesion subfifth metatarsal phalangeal joint of the left foot.  Assessment: Pain in limb secondary to onychomycosis and hammertoe deformity second right well-healed ulceration.  Painful callus subfifth met head left.  Plan: Debridement of all reactive hyperkeratotic tissue today.  Follow-up with him as needed

## 2020-05-01 ENCOUNTER — Ambulatory Visit: Payer: Medicare Other | Admitting: Podiatry

## 2020-05-16 NOTE — Patient Instructions (Addendum)
You can try melatonin for your sleep if you need it.  Try 1-2 mg just before bedtime.     Blood work was ordered.     Medications reviewed and updated.  Changes include :   none  Your prescription(s) have been submitted to your pharmacy. Please take as directed and contact our office if you believe you are having problem(s) with the medication(s).    Please followup in 3 months

## 2020-05-16 NOTE — Progress Notes (Signed)
Subjective:    Patient ID: Brett Franklin, male    DOB: 05-30-1926, 86 y.o.   MRN: [id number]  HPI The patient is here for follow up of their chronic medical problems, including par Afib, htn, hypothyroidism, dm, hyperlipidemia, anxiety  He is exercising regularly - he walks twice a day.   He is compliant with a diabetic diet.    Sometimes when he eats he coughs a little.  He sometimes will get something up when he coughs - it more mucus.   He denies gerd/reflux.  He denies dysphagia.  It occurs 2-3 times a weeks.  He is not sure if they usually occurs with liquid or solid food.    Medications and allergies reviewed with patient and updated if appropriate.  Patient Active Problem List   Diagnosis Date Noted  . Ingrown nail of great toe of right foot 03/16/2020  . Chronic sphenoidal sinusitis 07/06/2019  . Presbycusis of both ears 06/08/2019  . Acquired hypothyroidism 05/19/2019  . Lightheadedness 05/06/2019  . Chronic mycotic otitis externa 04/15/2019  . Atrial flutter ([place])   . Paroxysmal atrial fibrillation (HCC)   . ETD (eustachian tube dysfunction) 02/17/2019  . Poor balance 10/08/2018  . Right foot pain 01/13/2017  . Hyperlipidemia 07/21/2011  . OTHER DYSPHAGIA 03/04/2010  . History of stroke 12/31/2009  . LOW BACK PAIN SYNDROME 11/12/2009  . CERVICAL RADICULOPATHY, LEFT 05/11/2008  . NEPHROLITHIASIS, HX OF 05/11/2008  . Diabetes mellitus with neurological manifestations, controlled ([place]) 11/16/2007  . Anxiety 07/08/2007  . Cough 03/10/2007  . Melanoma of skin ([place]) 01/21/2007  . Essential hypertension 01/21/2007  . IBS 01/21/2007  . BPH (benign prostatic hyperplasia) 01/21/2007  . STENOSIS, CERVICAL SPINAL 01/21/2007  . History of adenomatous polyp of colon 01/21/2007    Current Outpatient Medications on File Prior to Visit  Medication Sig Dispense Refill  . blood glucose meter kit and supplies KIT Dispense based on patient and insurance preference. Use to text  blood sugars daily. 1 each 0  . diltiazem (CARDIZEM CD) 120 MG 24 hr capsule TAKE 1 CAPSULE(120 MG) BY MOUTH DAILY 90 capsule 3  . diltiazem (CARDIZEM) 30 MG tablet TAKE 1 TABLET BY MOUTH EVERY 6 HOURS AS NEEDED FOR FAST HEART RATE(ATRIAL FIBRILLATION) EPISODES 45 tablet 2  . dronedarone (MULTAQ) 400 MG tablet Take 1 tablet (400 mg total) by mouth 2 (two) times daily with a meal. 60 tablet 5  . ELIQUIS 5 MG TABS tablet TAKE 1 TABLET(5 MG) BY MOUTH TWICE DAILY 60 tablet 1  . glucose blood test strip Use to test blood sugars once daily. Dx code-E11.49 100 each 3  . JANUMET 50-500 MG tablet TAKE 1 TABLET BY MOUTH DAILY 90 tablet 1  . Lancets (FREESTYLE) lancets Use to check blood sugars daily. 100 each 1  . levothyroxine (SYNTHROID) 25 MCG tablet TAKE 1 TABLET(25 MCG) BY MOUTH DAILY BEFORE BREAKFAST 90 tablet 10  . LORazepam (ATIVAN) 0.5 MG tablet Take 1 tablet (0.5 mg total) by mouth every 12 (twelve) hours as needed for anxiety. 20 tablet 1  . mometasone (NASONEX) 50 MCG/ACT nasal spray SMARTSIG:2 Spray(s) Both Nares Every Night    . mupirocin ointment (BACTROBAN) 2 % Apply to wound after soaking BID 30 g 1  . polyethylene glycol (MIRALAX / GLYCOLAX) 17 g packet Take 17 g by mouth daily.    . pravastatin (PRAVACHOL) 20 MG tablet TAKE 1 TABLET(20 MG) BY MOUTH DAILY 90 tablet 1  . acetaminophen (TYLENOL) 650 MG  CR tablet Take 650 mg by mouth every 8 (eight) hours as needed for pain. (Patient not taking: Reported on 05/17/2020)    . fluticasone (FLONASE) 50 MCG/ACT nasal spray Place 2 sprays into both nostrils daily. (Patient not taking: Reported on 05/17/2020)    . [DISCONTINUED] metoprolol tartrate (LOPRESSOR) 25 MG tablet Take 1 tablet (25 mg total) by mouth 2 (two) times daily. 180 tablet 1   No current facility-administered medications on file prior to visit.    Past Medical History:  Diagnosis Date  . Adenomatous polyp of colon 11/1996   Dr Fuller Plan  . Anxiety   . BPH (benign prostatic  hyperplasia)   . Cervical spinal stenosis   . CVA (cerebral vascular accident) ([place])   . Diabetes mellitus, type 2 ([place])   . GERD (gastroesophageal reflux disease)   . Hearing loss   . Hemorrhoids   . Hypertension   . IBS (irritable bowel syndrome)   . Melanoma ([place])   . Nephrolithiasis     X 4    Past Surgical History:  Procedure Laterality Date  . CARDIOVERSION N/A 04/13/2019   Procedure: CARDIOVERSION;  Surgeon: Buford Dresser, MD;  Location: [facility] ENDOSCOPY;  Service: Cardiovascular;  Laterality: N/A;  . CATARACT EXTRACTION Left   . CHOLECYSTECTOMY    . COLONOSCOPY W/ POLYPECTOMY     Dr.Stark  . CYSTOSCOPY     Dr.Kimbrough     Social History   Socioeconomic History  . Marital status: Married    Spouse name: Not on file  . Number of children: 2  . Years of education: Not on file  . Highest education level: Not on file  Occupational History  . Occupation: retired    Fish farm manager: RETIRED  Tobacco Use  . Smoking status: Never Smoker  . Smokeless tobacco: Never Used  Vaping Use  . Vaping Use: Never used  Substance and Sexual Activity  . Alcohol use: No  . Drug use: No  . Sexual activity: Not Currently  Other Topics Concern  . Not on file  Social History Narrative  . Not on file   Social Determinants of Health   Financial Resource Strain:   . Difficulty of Paying Living Expenses: Not on file  Food Insecurity:   . Worried About Charity fundraiser in the Last Year: Not on file  . Ran Out of Food in the Last Year: Not on file  Transportation Needs:   . Lack of Transportation (Medical): Not on file  . Lack of Transportation (Non-Medical): Not on file  Physical Activity:   . Days of Exercise per Week: Not on file  . Minutes of Exercise per Session: Not on file  Stress:   . Feeling of Stress : Not on file  Social Connections:   . Frequency of Communication with Friends and Family: Not on file  . Frequency of Social Gatherings with Friends and Family: Not  on file  . Attends Religious Services: Not on file  . Active Member of Clubs or Organizations: Not on file  . Attends Archivist Meetings: Not on file  . Marital Status: Not on file    Family History  Problem Relation Age of Onset  . Alcohol abuse Father   . Heart disease Father   . Heart attack Maternal Grandfather 65  . Diabetes type I Other        grandson  . Cancer Neg Hx   . Stroke Neg Hx     Review of Systems  Constitutional: Negative for chills and fever.  HENT: Negative for trouble swallowing.   Respiratory: Positive for cough (with eating at times). Negative for shortness of breath and wheezing.   Cardiovascular: Negative for chest pain, palpitations and leg swelling.  Gastrointestinal: Negative for abdominal pain and nausea.  Neurological: Negative for light-headedness and headaches.       Objective:   Vitals:   05/17/20 0827  BP: 133/72  Pulse: (!) 59  Temp: 97.7 F (36.5 C)  SpO2: 99%   BP Readings from Last 3 Encounters:  05/17/20 133/72  02/14/20 (!) 142/64  02/14/20 (!) 142/64   Wt Readings from Last 3 Encounters:  05/17/20 160 lb (72.6 kg)  02/14/20 160 lb (72.6 kg)  02/14/20 160 lb (72.6 kg)   Body mass index is 22.96 kg/m.   Physical Exam    Constitutional: Appears well-developed and well-nourished. No distress.  HENT:  Head: Normocephalic and atraumatic.  Neck: Neck supple. No tracheal deviation present. No thyromegaly present.  No cervical lymphadenopathy Cardiovascular: Normal rate, regular rhythm and normal heart sounds.   No murmur heard. No carotid bruit .  No edema Pulmonary/Chest: Effort normal and breath sounds normal. No respiratory distress. No has no wheezes. No rales.  Skin: Skin is warm and dry. Not diaphoretic.  Psychiatric: Normal mood and affect. Behavior is normal.      Assessment & Plan:    See Problem List for Assessment and Plan of chronic medical problems.    This visit occurred during the  SARS-CoV-2 public health emergency.  Safety protocols were in place, including screening questions prior to the visit, additional usage of staff PPE, and extensive cleaning of exam room while observing appropriate contact time as indicated for disinfecting solutions.

## 2020-05-17 ENCOUNTER — Ambulatory Visit (INDEPENDENT_AMBULATORY_CARE_PROVIDER_SITE_OTHER): Payer: Medicare Other | Admitting: Internal Medicine

## 2020-05-17 ENCOUNTER — Encounter: Payer: Self-pay | Admitting: Internal Medicine

## 2020-05-17 ENCOUNTER — Other Ambulatory Visit: Payer: Self-pay

## 2020-05-17 VITALS — BP 133/72 | HR 59 | Temp 97.7°F | Ht 70.0 in | Wt 160.0 lb

## 2020-05-17 DIAGNOSIS — E039 Hypothyroidism, unspecified: Secondary | ICD-10-CM

## 2020-05-17 DIAGNOSIS — I10 Essential (primary) hypertension: Secondary | ICD-10-CM | POA: Diagnosis not present

## 2020-05-17 DIAGNOSIS — E7849 Other hyperlipidemia: Secondary | ICD-10-CM

## 2020-05-17 DIAGNOSIS — I480 Paroxysmal atrial fibrillation: Secondary | ICD-10-CM

## 2020-05-17 DIAGNOSIS — R059 Cough, unspecified: Secondary | ICD-10-CM

## 2020-05-17 DIAGNOSIS — R05 Cough: Secondary | ICD-10-CM

## 2020-05-17 DIAGNOSIS — E1149 Type 2 diabetes mellitus with other diabetic neurological complication: Secondary | ICD-10-CM | POA: Diagnosis not present

## 2020-05-17 DIAGNOSIS — F419 Anxiety disorder, unspecified: Secondary | ICD-10-CM

## 2020-05-17 MED ORDER — APIXABAN 5 MG PO TABS
ORAL_TABLET | ORAL | 1 refills | Status: DC
Start: 2020-05-17 — End: 2020-11-22

## 2020-05-17 MED ORDER — JANUMET 50-500 MG PO TABS
1.0000 | ORAL_TABLET | Freq: Every day | ORAL | 1 refills | Status: DC
Start: 2020-05-17 — End: 2021-01-21

## 2020-05-17 MED ORDER — LEVOTHYROXINE SODIUM 25 MCG PO TABS
ORAL_TABLET | ORAL | 3 refills | Status: DC
Start: 2020-05-17 — End: 2020-07-23

## 2020-05-17 MED ORDER — PRAVASTATIN SODIUM 20 MG PO TABS
ORAL_TABLET | ORAL | 1 refills | Status: DC
Start: 2020-05-17 — End: 2020-11-20

## 2020-05-17 NOTE — Assessment & Plan Note (Signed)
Chronic Intermittent Rarely takes Ativan Advised not to take to help him sleep Can continue as needed

## 2020-05-17 NOTE — Assessment & Plan Note (Addendum)
Chronic Following with cardiology Asymptomatic On Eliquis, Cardizem CBC, CMP, TSH

## 2020-05-17 NOTE — Assessment & Plan Note (Signed)
Chronic BP well controlled Current regimen effective and well tolerated Continue current medications at current doses cmp  

## 2020-05-17 NOTE — Assessment & Plan Note (Signed)
Chronic Well controlled Check A1c Continue current medication

## 2020-05-17 NOTE — Assessment & Plan Note (Signed)
Acute States cough sometimes when eating He is a poor historian and is not sure if it occurs at any particular time of day or with certain foods Denies dysphagia, GERD, nausea or pain Discussed possible swallowing evaluation At this time we will just monitor-he will pay more attention to potential causes-if continues would benefit from a swallow evaluation

## 2020-05-17 NOTE — Assessment & Plan Note (Signed)
Chronic  Clinically euthyroid Check tsh  Titrate med dose if needed  

## 2020-05-17 NOTE — Assessment & Plan Note (Signed)
Chronic Check lipid panel  Continue daily statin Regular exercise and healthy diet encouraged  

## 2020-05-18 LAB — COMPREHENSIVE METABOLIC PANEL
AG Ratio: 1.9 (calc) (ref 1.0–2.5)
ALT: 10 U/L (ref 9–46)
AST: 17 U/L (ref 10–35)
Albumin: 4.5 g/dL (ref 3.6–5.1)
Alkaline phosphatase (APISO): 44 U/L (ref 35–144)
BUN/Creatinine Ratio: 19 (calc) (ref 6–22)
BUN: 23 mg/dL (ref 7–25)
CO2: 26 mmol/L (ref 20–32)
Calcium: 9.4 mg/dL (ref 8.6–10.3)
Chloride: 101 mmol/L (ref 98–110)
Creat: 1.2 mg/dL — ABNORMAL HIGH (ref 0.70–1.11)
Globulin: 2.4 g/dL (calc) (ref 1.9–3.7)
Glucose, Bld: 133 mg/dL — ABNORMAL HIGH (ref 65–99)
Potassium: 4.7 mmol/L (ref 3.5–5.3)
Sodium: 139 mmol/L (ref 135–146)
Total Bilirubin: 0.8 mg/dL (ref 0.2–1.2)
Total Protein: 6.9 g/dL (ref 6.1–8.1)

## 2020-05-18 LAB — CBC WITH DIFFERENTIAL/PLATELET
Absolute Monocytes: 1123 cells/uL — ABNORMAL HIGH (ref 200–950)
Basophils Absolute: 31 cells/uL (ref 0–200)
Basophils Relative: 0.3 %
Eosinophils Absolute: 94 cells/uL (ref 15–500)
Eosinophils Relative: 0.9 %
HCT: 42.4 % (ref 38.5–50.0)
Hemoglobin: 14.1 g/dL (ref 13.2–17.1)
Lymphs Abs: 1394 cells/uL (ref 850–3900)
MCH: 32.2 pg (ref 27.0–33.0)
MCHC: 33.3 g/dL (ref 32.0–36.0)
MCV: 96.8 fL (ref 80.0–100.0)
MPV: 11.7 fL (ref 7.5–12.5)
Monocytes Relative: 10.8 %
Neutro Abs: 7758 cells/uL (ref 1500–7800)
Neutrophils Relative %: 74.6 %
Platelets: 217 10*3/uL (ref 140–400)
RBC: 4.38 10*6/uL (ref 4.20–5.80)
RDW: 12.4 % (ref 11.0–15.0)
Total Lymphocyte: 13.4 %
WBC: 10.4 10*3/uL (ref 3.8–10.8)

## 2020-05-18 LAB — LIPID PANEL
Cholesterol: 119 mg/dL (ref <200)
HDL: 76 mg/dL (ref >=40)
LDL Cholesterol (Calc): 32 mg/dL (calc)
Non-HDL Cholesterol (Calc): 43 mg/dL (calc) (ref <130)
Total CHOL/HDL Ratio: 1.6 (calc) (ref <5.0)
Triglycerides: 40 mg/dL (ref <150)

## 2020-05-18 LAB — TSH: TSH: 4.52 mIU/L — ABNORMAL HIGH (ref 0.40–4.50)

## 2020-05-18 LAB — HEMOGLOBIN A1C
Hgb A1c MFr Bld: 5.8 % of total Hgb — ABNORMAL HIGH (ref <5.7)
Mean Plasma Glucose: 120 (calc)
eAG (mmol/L): 6.6 (calc)

## 2020-05-24 ENCOUNTER — Encounter: Payer: Self-pay | Admitting: Podiatry

## 2020-05-24 ENCOUNTER — Other Ambulatory Visit: Payer: Self-pay

## 2020-05-24 ENCOUNTER — Ambulatory Visit (INDEPENDENT_AMBULATORY_CARE_PROVIDER_SITE_OTHER): Payer: Medicare Other | Admitting: Podiatry

## 2020-05-24 DIAGNOSIS — E1142 Type 2 diabetes mellitus with diabetic polyneuropathy: Secondary | ICD-10-CM | POA: Diagnosis not present

## 2020-05-24 DIAGNOSIS — Q828 Other specified congenital malformations of skin: Secondary | ICD-10-CM

## 2020-05-24 DIAGNOSIS — D689 Coagulation defect, unspecified: Secondary | ICD-10-CM | POA: Diagnosis not present

## 2020-05-24 DIAGNOSIS — L97511 Non-pressure chronic ulcer of other part of right foot limited to breakdown of skin: Secondary | ICD-10-CM | POA: Diagnosis not present

## 2020-05-24 NOTE — Progress Notes (Signed)
He presents today for follow-up of ulceration to the dorsal aspect of the PIPJ second digit of the right foot.  He also has a painful callus subfifth metatarsal of the left foot.  He has a history of diabetes mellitus with diabetic peripheral neuropathy.  Objective: Vital signs stable he is alert oriented x3 pulses are palpable 1/4 DP PT bilateral capillary fill time is immediate.  Superficial ulceration dorsal aspect second toe right foot reactive hyperkeratotic tissue once debrided prior to debridement measured 3 mm post debridement measures 3 mm with a 15 blade.  No purulence no malodor.  Placed silver nitrat.  Also debrided reactive hyperkeratotic lesion subfifth met head left.  Assessment: Painful reactive hyperkeratotic lesion subfifth metatarsal head of the left foot and hammertoe deformity with diabetic peripheral vascular disease and diabetic ulceration second digit right.  Plan: Debrided all reactive hyperkeratotic tissue debrided ulceration.  Placed dry sterile dressing he will continue to dress on daily basis follow-up with me in 2 months

## 2020-06-22 ENCOUNTER — Ambulatory Visit: Payer: Medicare Other | Admitting: Podiatry

## 2020-06-25 ENCOUNTER — Other Ambulatory Visit: Payer: Self-pay | Admitting: Cardiology

## 2020-07-22 ENCOUNTER — Other Ambulatory Visit: Payer: Self-pay | Admitting: Internal Medicine

## 2020-07-25 ENCOUNTER — Ambulatory Visit (INDEPENDENT_AMBULATORY_CARE_PROVIDER_SITE_OTHER): Payer: Medicare Other

## 2020-07-25 ENCOUNTER — Other Ambulatory Visit: Payer: Self-pay

## 2020-07-25 DIAGNOSIS — Z23 Encounter for immunization: Secondary | ICD-10-CM

## 2020-08-09 ENCOUNTER — Ambulatory Visit (INDEPENDENT_AMBULATORY_CARE_PROVIDER_SITE_OTHER): Payer: Medicare Other | Admitting: Podiatry

## 2020-08-09 ENCOUNTER — Other Ambulatory Visit: Payer: Self-pay

## 2020-08-09 DIAGNOSIS — Q828 Other specified congenital malformations of skin: Secondary | ICD-10-CM

## 2020-08-09 DIAGNOSIS — L97511 Non-pressure chronic ulcer of other part of right foot limited to breakdown of skin: Secondary | ICD-10-CM

## 2020-08-09 DIAGNOSIS — B351 Tinea unguium: Secondary | ICD-10-CM

## 2020-08-09 DIAGNOSIS — E1142 Type 2 diabetes mellitus with diabetic polyneuropathy: Secondary | ICD-10-CM | POA: Diagnosis not present

## 2020-08-09 DIAGNOSIS — D689 Coagulation defect, unspecified: Secondary | ICD-10-CM

## 2020-08-09 DIAGNOSIS — M79676 Pain in unspecified toe(s): Secondary | ICD-10-CM

## 2020-08-11 NOTE — Progress Notes (Signed)
He presents today chief complaint of painful ingrown toenails and thick nails and calluses bilaterally.  Objective: Vital signs are stable he is alert and oriented x3.  Pulses are palpable.  No open lesions or wounds are noted at this time subfifth met tarsal phalangeal joint porokeratosis is present.  He also has hammertoe deformity second right.  Toenails are sharply incurvated and painful particularly the hallux nails otherwise nails are thick yellow dystrophic-like mycotic.  Assessment: Pain in limb secondary to porokeratosis and pain in limb secondary to onychomycosis.  Plan: Debrided sharp margins of the nail debrided all reactive hyperkeratotic tissue.  Follow-up with him as needed.

## 2020-08-14 ENCOUNTER — Other Ambulatory Visit: Payer: Self-pay

## 2020-08-14 ENCOUNTER — Encounter: Payer: Self-pay | Admitting: Cardiology

## 2020-08-14 ENCOUNTER — Ambulatory Visit (INDEPENDENT_AMBULATORY_CARE_PROVIDER_SITE_OTHER): Payer: Medicare Other | Admitting: Cardiology

## 2020-08-14 VITALS — BP 118/68 | HR 64 | Ht 70.0 in | Wt 159.0 lb

## 2020-08-14 DIAGNOSIS — I480 Paroxysmal atrial fibrillation: Secondary | ICD-10-CM

## 2020-08-14 DIAGNOSIS — I4819 Other persistent atrial fibrillation: Secondary | ICD-10-CM | POA: Diagnosis not present

## 2020-08-14 NOTE — Progress Notes (Signed)
Electrophysiology Office Note   Date:  08/14/2020   ID:  Brett Franklin, DOB Feb 15, 1926, MRN [id number]  PCP:  Binnie Rail, MD  Cardiologist:   Primary Electrophysiologist:  Mahaila Tischer Meredith Leeds, MD    No chief complaint on file.    History of Present Illness: Brett Franklin is a 86 y.o. male who is being seen today for the evaluation of AF/flutter at the request of Burns, Claudina Lick, MD. Presenting today for electrophysiology evaluation.  He has a history of hypertension.  He presented to his primary physician's office with rapid heart rate.  An ECG showed heart rates in the 140s and atrial fibrillation.  He had weakness and fatigue at that time.  His metoprolol dose was increased.  He presented to cardiology clinic with continued palpitations.  He had a cardioversion 04/13/2019 but unfortunately went back into atrial fibrillation.  He went to the emergency room 06/01/2019 and converted with IV metoprolol.  He was started on Multaq.  Today, denies symptoms of palpitations, chest pain, shortness of breath, orthopnea, PND, lower extremity edema, claudication, dizziness, presyncope, syncope, bleeding, or neurologic sequela. The patient is tolerating medications without difficulties.  He is feeling well.  He has no chest pain or shortness of breath.  He is able do all of his daily activities without restriction.  He is noted no further episodes of atrial fibrillation since being on his multiple   Past Medical History:  Diagnosis Date  . Adenomatous polyp of colon 11/1996   Dr Fuller Plan  . Anxiety   . BPH (benign prostatic hyperplasia)   . Cervical spinal stenosis   . CVA (cerebral vascular accident) ([place])   . Diabetes mellitus, type 2 ([place])   . GERD (gastroesophageal reflux disease)   . Hearing loss   . Hemorrhoids   . Hypertension   . IBS (irritable bowel syndrome)   . Melanoma ([place])   . Nephrolithiasis     X 4   Past Surgical History:  Procedure Laterality Date  . CARDIOVERSION  N/A 04/13/2019   Procedure: CARDIOVERSION;  Surgeon: Buford Dresser, MD;  [facility];  Service: Cardiovascular;  Laterality: N/A;  . CATARACT EXTRACTION Left   . CHOLECYSTECTOMY    . COLONOSCOPY W/ POLYPECTOMY     Dr.Stark  . CYSTOSCOPY     Dr.Kimbrough      Current Outpatient Medications  Medication Sig Dispense Refill  . acetaminophen (TYLENOL) 650 MG CR tablet Take 650 mg by mouth every 8 (eight) hours as needed for pain.     Marland Kitchen apixaban (ELIQUIS) 5 MG TABS tablet TAKE 1 TABLET(5 MG) BY MOUTH TWICE DAILY 180 tablet 1  . blood glucose meter kit and supplies KIT Dispense based on patient and insurance preference. Use to text blood sugars daily. 1 each 0  . diltiazem (CARDIZEM CD) 120 MG 24 hr capsule TAKE 1 CAPSULE(120 MG) BY MOUTH DAILY 90 capsule 3  . diltiazem (CARDIZEM) 30 MG tablet TAKE 1 TABLET BY MOUTH EVERY 6 HOURS AS NEEDED FOR FAST HEART RATE(ATRIAL FIBRILLATION) EPISODES 45 tablet 2  . glucose blood test strip Use to test blood sugars once daily. Dx code-E11.49 100 each 3  . Lancets (FREESTYLE) lancets Use to check blood sugars daily. 100 each 1  . levothyroxine (SYNTHROID) 25 MCG tablet TAKE 1 TABLET(25 MCG) BY MOUTH DAILY BEFORE BREAKFAST 90 tablet 1  . metoprolol tartrate (LOPRESSOR) 25 MG tablet Take 25 mg by mouth 2 (two) times daily.    . mometasone (  NASONEX) 50 MCG/ACT nasal spray SMARTSIG:2 Spray(s) Both Nares Every Night    . MULTAQ 400 MG tablet TAKE 1 TABLET(400 MG) BY MOUTH TWICE DAILY WITH A MEAL 60 tablet 5  . mupirocin ointment (BACTROBAN) 2 % Apply to wound after soaking BID 30 g 1  . polyethylene glycol (MIRALAX / GLYCOLAX) 17 g packet Take 17 g by mouth daily.    . pravastatin (PRAVACHOL) 20 MG tablet TAKE 1 TABLET(20 MG) BY MOUTH DAILY 90 tablet 1  . sitaGLIPtin-metformin (JANUMET) 50-500 MG tablet Take 1 tablet by mouth daily. 90 tablet 1   No current facility-administered medications for this visit.    Allergies:   Lisinopril    Social History:  The patient  reports that he has never smoked. He has never used smokeless tobacco. He reports that he does not drink alcohol and does not use drugs.   Family History:  The patient's family history includes Alcohol abuse in his father; Diabetes type I in an other family member; Heart attack (age of onset: 75) in his maternal grandfather; Heart disease in his father.   ROS:  Please see the history of present illness.   Otherwise, review of systems is positive for none.   All other systems are reviewed and negative.   PHYSICAL EXAM: VS:  BP 118/68   Pulse 64   Ht _0  (1.778 m)   Wt 159 lb (72.1 kg)   SpO2 96%   BMI 22.81 kg/m  , BMI Body mass index is 22.81 kg/m. GEN: Well nourished, well developed, in no acute distress  HEENT: normal  Neck: no JVD, carotid bruits, or masses Cardiac: RRR; no murmurs, rubs, or gallops,no edema  Respiratory:  clear to auscultation bilaterally, normal work of breathing GI: soft, nontender, nondistended, + BS MS: no deformity or atrophy  Skin: warm and dry Neuro:  Strength and sensation are intact Psych: euthymic mood, full affect  EKG:  EKG is ordered today. Personal review of the ekg ordered shows sinus rhythm, PVC, rate 59  Recent Labs: 05/17/2020: ALT 10; BUN 23; Creat 1.20; Hemoglobin 14.1; Platelets 217; Potassium 4.7; Sodium 139; TSH 4.52    Lipid Panel     Component Value Date/Time   CHOL 119 05/17/2020 0901   TRIG 40 05/17/2020 0901   TRIG 36 09/04/2006 0915   HDL 76 05/17/2020 0901   CHOLHDL 1.6 05/17/2020 0901   VLDL 7.6 11/24/2019 0813   LDLCALC 32 05/17/2020 0901     Wt Readings from Last 3 Encounters:  08/14/20 159 lb (72.1 kg)  05/17/20 160 lb (72.6 kg)  02/14/20 160 lb (72.6 kg)      Other studies Reviewed: Additional studies/ records that were reviewed today include: PCP records  ASSESSMENT AND PLAN:  1.  Persistent atrial fibrillation/flutter: Currently on Multaq with monitoring for high  risk medication and Eliquis.  CHA2DS2-VASc of 6.  He is remained in sinus rhythm.  He has no complaints today.  He is noted no further episodes of atrial fibrillation.  No changes.   2.  Hypertension: Currently well controlled   Current medicines are reviewed at length with the patient today.   The patient does not have concerns regarding his medicines.  The following changes were made today: None  Labs/ tests ordered today include:  Orders Placed This Encounter  Procedures  . EKG 12-Lead     Disposition:   FU with Allana Shrestha 6 months  Signed, Kenderick Kobler Meredith Leeds, MD  08/14/2020 11:09 AM  [address] [place] [address] [phone] (office) [phone] (fax)

## 2020-08-14 NOTE — Patient Instructions (Addendum)
Medication Instructions:  Your physician recommends that you continue on your current medications as directed. Please refer to the Current Medication list given to you today.  *If you need a refill on your cardiac medications before your next appointment, please call your pharmacy*   Lab Work: None ordered   Testing/Procedures: None ordered   Follow-Up: At [facility], you and your health needs are our priority.  As part of our continuing mission to provide you with exceptional heart care, we have created designated Provider Care Teams.  These Care Teams include your primary Cardiologist (physician) and Advanced Practice Providers (APPs -  Physician Assistants and Nurse Practitioners) who all work together to provide you with the care you need, when you need it.  We recommend signing up for the patient portal called "MyChart".  Sign up information is provided on this After Visit Summary.  MyChart is used to connect with patients for Virtual Visits (Telemedicine).  Patients are able to view lab/test results, encounter notes, upcoming appointments, etc.  Non-urgent messages can be sent to your provider as well.   To learn more about what you can do with MyChart, go to [email or web address].    Your next appointment:   6 month(s)  The format for your next appointment:   In Person  Provider:   Allegra Lai, MD    Thank you for choosing [facility]!!   Trinidad Curet, RN [phone]   Other Instructions    Me

## 2020-08-16 NOTE — Patient Instructions (Addendum)
  Blood work was ordered.     Medications reviewed and updated.  Changes include :   none       Please followup in 3 months   

## 2020-08-16 NOTE — Progress Notes (Signed)
Subjective:    Patient ID: Brett Franklin, male    DOB: June 21, 1926, 86 y.o.   MRN: [id number]  HPI The patient is here for follow up of their chronic medical problems, including pAfib, htn, hypothyroidism DM, hyperlipidemia, anxiety  He is exercising regularly.   He walks and rides the bike.    His balance is poor.  He denies falls.   At night mostly he occasionally feels pain in his right upper back and it extends up into the neck.  It may last an hour or so.    Medications and allergies reviewed with patient and updated if appropriate.  Patient Active Problem List   Diagnosis Date Noted   Ingrown nail of great toe of right foot 03/16/2020   Chronic sphenoidal sinusitis 07/06/2019   Presbycusis of both ears 06/08/2019   Acquired hypothyroidism 05/19/2019   Lightheadedness 05/06/2019   Chronic mycotic otitis externa 04/15/2019   Atrial flutter (HCC)    Paroxysmal atrial fibrillation (HCC)    ETD (eustachian tube dysfunction) 02/17/2019   Poor balance 10/08/2018   Right foot pain 01/13/2017   Hyperlipidemia 07/21/2011   OTHER DYSPHAGIA 03/04/2010   History of stroke 12/31/2009   LOW BACK PAIN SYNDROME 11/12/2009   CERVICAL RADICULOPATHY, LEFT 05/11/2008   NEPHROLITHIASIS, HX OF 05/11/2008   Diabetes mellitus with neurological manifestations, controlled ([place]) 11/16/2007   Anxiety 07/08/2007   Cough 03/10/2007   Melanoma of skin ([place]) 01/21/2007   Essential hypertension 01/21/2007   IBS 01/21/2007   BPH (benign prostatic hyperplasia) 01/21/2007   STENOSIS, CERVICAL SPINAL 01/21/2007   History of adenomatous polyp of colon 01/21/2007    Current Outpatient Medications on File Prior to Visit  Medication Sig Dispense Refill   acetaminophen (TYLENOL) 650 MG CR tablet Take 650 mg by mouth every 8 (eight) hours as needed for pain.      apixaban (ELIQUIS) 5 MG TABS tablet TAKE 1 TABLET(5 MG) BY MOUTH TWICE DAILY 180 tablet 1   blood glucose meter  kit and supplies KIT Dispense based on patient and insurance preference. Use to text blood sugars daily. 1 each 0   diltiazem (CARDIZEM CD) 120 MG 24 hr capsule TAKE 1 CAPSULE(120 MG) BY MOUTH DAILY 90 capsule 3   diltiazem (CARDIZEM) 30 MG tablet TAKE 1 TABLET BY MOUTH EVERY 6 HOURS AS NEEDED FOR FAST HEART RATE(ATRIAL FIBRILLATION) EPISODES 45 tablet 2   glucose blood test strip Use to test blood sugars once daily. Dx code-E11.49 100 each 3   Lancets (FREESTYLE) lancets Use to check blood sugars daily. 100 each 1   levothyroxine (SYNTHROID) 25 MCG tablet TAKE 1 TABLET(25 MCG) BY MOUTH DAILY BEFORE BREAKFAST 90 tablet 1   metoprolol tartrate (LOPRESSOR) 25 MG tablet Take 25 mg by mouth 2 (two) times daily.     mometasone (NASONEX) 50 MCG/ACT nasal spray SMARTSIG:2 Spray(s) Both Nares Every Night     MULTAQ 400 MG tablet TAKE 1 TABLET(400 MG) BY MOUTH TWICE DAILY WITH A MEAL 60 tablet 5   mupirocin ointment (BACTROBAN) 2 % Apply to wound after soaking BID 30 g 1   polyethylene glycol (MIRALAX / GLYCOLAX) 17 g packet Take 17 g by mouth daily.     pravastatin (PRAVACHOL) 20 MG tablet TAKE 1 TABLET(20 MG) BY MOUTH DAILY 90 tablet 1   sitaGLIPtin-metformin (JANUMET) 50-500 MG tablet Take 1 tablet by mouth daily. 90 tablet 1   No current facility-administered medications on file prior to visit.    Past  Medical History:  Diagnosis Date   Adenomatous polyp of colon 11/1996   Dr Fuller Plan   Anxiety    BPH (benign prostatic hyperplasia)    Cervical spinal stenosis    CVA (cerebral vascular accident) ([place])    Diabetes mellitus, type 2 (HCC)    GERD (gastroesophageal reflux disease)    Hearing loss    Hemorrhoids    Hypertension    IBS (irritable bowel syndrome)    Melanoma ([place])    Nephrolithiasis     X 4    Past Surgical History:  Procedure Laterality Date   CARDIOVERSION N/A 04/13/2019   Procedure: CARDIOVERSION;  Surgeon: Buford Dresser, MD;  Location: [facility]  ENDOSCOPY;  Service: Cardiovascular;  Laterality: N/A;   CATARACT EXTRACTION Left    CHOLECYSTECTOMY     COLONOSCOPY W/ POLYPECTOMY     Dr.Stark   CYSTOSCOPY     Dr.Kimbrough     Social History   Socioeconomic History   Marital status: Married    Spouse name: Not on file   Number of children: 2   Years of education: Not on file   Highest education level: Not on file  Occupational History   Occupation: retired    Fish farm manager: RETIRED  Tobacco Use   Smoking status: Never Smoker   Smokeless tobacco: Never Used  Scientific laboratory technician Use: Never used  Substance and Sexual Activity   Alcohol use: No   Drug use: No   Sexual activity: Not Currently  Other Topics Concern   Not on file  Social History Narrative   Not on file   Social Determinants of Health   Financial Resource Strain:    Difficulty of Paying Living Expenses: Not on file  Food Insecurity:    Worried About Charity fundraiser in the Last Year: Not on file   Gleed in the Last Year: Not on file  Transportation Needs:    Lack of Transportation (Medical): Not on file   Lack of Transportation (Non-Medical): Not on file  Physical Activity:    Days of Exercise per Week: Not on file   Minutes of Exercise per Session: Not on file  Stress:    Feeling of Stress : Not on file  Social Connections:    Frequency of Communication with Friends and Family: Not on file   Frequency of Social Gatherings with Friends and Family: Not on file   Attends Religious Services: Not on file   Active Member of Clubs or Organizations: Not on file   Attends Archivist Meetings: Not on file   Marital Status: Not on file    Family History  Problem Relation Age of Onset   Alcohol abuse Father    Heart disease Father    Heart attack Maternal Grandfather 31   Diabetes type I Other        grandson   Cancer Neg Hx    Stroke Neg Hx     Review of Systems     Objective:  There were  no vitals filed for this visit. BP Readings from Last 3 Encounters:  08/14/20 118/68  05/17/20 133/72  02/14/20 (!) 142/64   Wt Readings from Last 3 Encounters:  08/14/20 159 lb (72.1 kg)  05/17/20 160 lb (72.6 kg)  02/14/20 160 lb (72.6 kg)   There is no height or weight on file to calculate BMI.   Physical Exam    Constitutional: Appears well-developed and well-nourished. No distress.  HENT:  Head: Normocephalic and atraumatic.  Neck: Neck supple. No tracheal deviation present. No thyromegaly present.  No cervical lymphadenopathy Cardiovascular: Normal rate, regular rhythm and normal heart sounds.   No murmur heard. No carotid bruit .  No edema Pulmonary/Chest: Effort normal and breath sounds normal. No respiratory distress. No has no wheezes. No rales.  Skin: Skin is warm and dry. Not diaphoretic.  Psychiatric: Normal mood and affect. Behavior is normal.      Assessment & Plan:    See Problem List for Assessment and Plan of chronic medical problems.    This visit occurred during the SARS-CoV-2 public health emergency.  Safety protocols were in place, including screening questions prior to the visit, additional usage of staff PPE, and extensive cleaning of exam room while observing appropriate contact time as indicated for disinfecting solutions.

## 2020-08-17 ENCOUNTER — Encounter: Payer: Self-pay | Admitting: Internal Medicine

## 2020-08-17 ENCOUNTER — Ambulatory Visit (INDEPENDENT_AMBULATORY_CARE_PROVIDER_SITE_OTHER): Payer: Medicare Other | Admitting: Internal Medicine

## 2020-08-17 ENCOUNTER — Other Ambulatory Visit: Payer: Self-pay

## 2020-08-17 VITALS — BP 124/76 | HR 65 | Temp 98.0°F | Ht 70.0 in | Wt 159.0 lb

## 2020-08-17 DIAGNOSIS — E039 Hypothyroidism, unspecified: Secondary | ICD-10-CM | POA: Diagnosis not present

## 2020-08-17 DIAGNOSIS — E1149 Type 2 diabetes mellitus with other diabetic neurological complication: Secondary | ICD-10-CM | POA: Diagnosis not present

## 2020-08-17 DIAGNOSIS — F419 Anxiety disorder, unspecified: Secondary | ICD-10-CM

## 2020-08-17 DIAGNOSIS — I480 Paroxysmal atrial fibrillation: Secondary | ICD-10-CM | POA: Diagnosis not present

## 2020-08-17 DIAGNOSIS — I10 Essential (primary) hypertension: Secondary | ICD-10-CM | POA: Diagnosis not present

## 2020-08-17 DIAGNOSIS — E7849 Other hyperlipidemia: Secondary | ICD-10-CM

## 2020-08-17 LAB — BASIC METABOLIC PANEL
BUN: 22 mg/dL (ref 6–23)
CO2: 26 mEq/L (ref 19–32)
Calcium: 9.4 mg/dL (ref 8.4–10.5)
Chloride: 102 mEq/L (ref 96–112)
Creatinine, Ser: 1.15 mg/dL (ref 0.40–1.50)
GFR: 55.17 mL/min — ABNORMAL LOW (ref >60.00)
Glucose, Bld: 122 mg/dL — ABNORMAL HIGH (ref 70–99)
Potassium: 4.4 mEq/L (ref 3.5–5.1)
Sodium: 138 mEq/L (ref 135–145)

## 2020-08-17 LAB — TSH: TSH: 4.07 u[IU]/mL (ref 0.35–4.50)

## 2020-08-17 LAB — HEMOGLOBIN A1C: Hgb A1c MFr Bld: 6.1 % (ref 4.6–6.5)

## 2020-08-17 NOTE — Assessment & Plan Note (Signed)
Chronic Following with cardiology asymptomic  On eliquis, diltiazem

## 2020-08-17 NOTE — Assessment & Plan Note (Signed)
Chronic  Clinically euthyroid Currently taking levothyroxine 25 mcg Check tsh  Titrate med dose if needed

## 2020-08-17 NOTE — Assessment & Plan Note (Signed)
Chronic intermittent Controlled, stable w/o medication

## 2020-08-17 NOTE — Addendum Note (Signed)
Addended by: Cresenciano Lick on: 08/17/2020 08:24 AM   Modules accepted: Orders

## 2020-08-17 NOTE — Assessment & Plan Note (Signed)
Chronic BP well controlled Continue diltiazem 120 mg daily, metoprolol 25 mg BID,  bmp

## 2020-08-17 NOTE — Assessment & Plan Note (Signed)
Chronic janumet 50-500 mg daily Check a1c Low sugar / carb diet Continue regular exercise

## 2020-08-17 NOTE — Assessment & Plan Note (Addendum)
Chronic ?Continue pravastatin 20 mg daily ?Regular exercise and healthy diet encouraged ? ?

## 2020-08-20 ENCOUNTER — Telehealth: Payer: Self-pay | Admitting: Internal Medicine

## 2020-08-20 ENCOUNTER — Other Ambulatory Visit: Payer: Self-pay | Admitting: Internal Medicine

## 2020-08-20 NOTE — Telephone Encounter (Signed)
Patsy calling to get the patients lab results [phone]

## 2020-08-21 ENCOUNTER — Other Ambulatory Visit: Payer: Self-pay | Admitting: Internal Medicine

## 2020-08-21 MED ORDER — LORAZEPAM 0.5 MG PO TABS: 0.5000 mg | ORAL_TABLET | Freq: Two times a day (BID) | ORAL | 1 refills | Status: DC | PRN

## 2020-08-28 ENCOUNTER — Ambulatory Visit: Payer: Medicare Other | Admitting: Podiatry

## 2020-09-04 ENCOUNTER — Ambulatory Visit: Payer: Medicare Other | Admitting: Podiatry

## 2020-09-27 ENCOUNTER — Other Ambulatory Visit: Payer: Self-pay

## 2020-09-27 ENCOUNTER — Ambulatory Visit (INDEPENDENT_AMBULATORY_CARE_PROVIDER_SITE_OTHER): Payer: Medicare Other | Admitting: Podiatry

## 2020-09-27 DIAGNOSIS — E1142 Type 2 diabetes mellitus with diabetic polyneuropathy: Secondary | ICD-10-CM | POA: Diagnosis not present

## 2020-09-27 DIAGNOSIS — M79676 Pain in unspecified toe(s): Secondary | ICD-10-CM

## 2020-09-27 DIAGNOSIS — B351 Tinea unguium: Secondary | ICD-10-CM

## 2020-09-27 DIAGNOSIS — D2371 Other benign neoplasm of skin of right lower limb, including hip: Secondary | ICD-10-CM | POA: Diagnosis not present

## 2020-09-27 DIAGNOSIS — M7752 Other enthesopathy of left foot: Secondary | ICD-10-CM | POA: Diagnosis not present

## 2020-09-27 DIAGNOSIS — D2372 Other benign neoplasm of skin of left lower limb, including hip: Secondary | ICD-10-CM | POA: Diagnosis not present

## 2020-09-27 DIAGNOSIS — D689 Coagulation defect, unspecified: Secondary | ICD-10-CM

## 2020-09-27 MED ORDER — DEXAMETHASONE SODIUM PHOSPHATE 120 MG/30ML IJ SOLN
2.0000 mg | Freq: Once | INTRAMUSCULAR | Status: AC
  Administered 2020-09-27: 11:00:00 2 mg via INTRA_ARTICULAR

## 2020-09-27 NOTE — Progress Notes (Signed)
He presents today chief complaint of painful toenails and painful calluses subfifth metatarsal head of the left foot.  Objective: Vital signs are stable he is alert oriented x3 pulses are palpable. He has an area of fluctuance and bogginess beneath the fifth metatarsal head with an overlying reactive hyperkeratotic lesion. This is most consistent with bursitis and a poor keratoma.  Toenails are long thick yellow dystrophic-like mycotic.  Assessment: Pain in limb secondary to onychomycosis. Pain in limb secondary to bursitis and poor keratoma. This is a benign skin lesion.  Plan: Debridement of benign skin lesion I injected the bursa with 2 mg of dexamethasone and local anesthetic. Also debrided nails 1 through 5 bilateral.

## 2020-10-10 ENCOUNTER — Other Ambulatory Visit: Payer: Self-pay

## 2020-10-10 MED ORDER — DILTIAZEM HCL 30 MG PO TABS: ORAL_TABLET | ORAL | 2 refills | Status: DC

## 2020-10-10 NOTE — Telephone Encounter (Signed)
Pt's medication was sent to pt's pharmacy as requested. Confirmation received.  °

## 2020-11-08 ENCOUNTER — Other Ambulatory Visit: Payer: Self-pay

## 2020-11-08 ENCOUNTER — Ambulatory Visit (INDEPENDENT_AMBULATORY_CARE_PROVIDER_SITE_OTHER): Payer: Medicare Other | Admitting: Podiatry

## 2020-11-08 ENCOUNTER — Encounter: Payer: Self-pay | Admitting: Podiatry

## 2020-11-08 DIAGNOSIS — E1142 Type 2 diabetes mellitus with diabetic polyneuropathy: Secondary | ICD-10-CM | POA: Diagnosis not present

## 2020-11-08 DIAGNOSIS — B351 Tinea unguium: Secondary | ICD-10-CM | POA: Diagnosis not present

## 2020-11-08 DIAGNOSIS — D2372 Other benign neoplasm of skin of left lower limb, including hip: Secondary | ICD-10-CM | POA: Diagnosis not present

## 2020-11-08 DIAGNOSIS — D689 Coagulation defect, unspecified: Secondary | ICD-10-CM

## 2020-11-08 DIAGNOSIS — M79676 Pain in unspecified toe(s): Secondary | ICD-10-CM | POA: Diagnosis not present

## 2020-11-09 ENCOUNTER — Ambulatory Visit: Payer: Medicare Other | Admitting: Podiatry

## 2020-11-10 NOTE — Progress Notes (Signed)
He presents today chief complaint of painfully elongated toenails and painful callus subfifth metatarsal of the left foot.  Objective: Vital signs are stable he is alert oriented x3.  Pulses are palpable 1/4 PT DP capillary fill time is immediate neurologic sensorium is diminished per Semmes Weinstein monofilament.  No open lesions or wounds reactive hyperkeratosis is noted subfifth left over right.  No signs of infection other than onychomycosis.  Assessment: Pain in limb secondary to onychomycosis pain in limb secondary to poor keratoma and diabetic peripheral neuropathy benign skin lesion.  Plan: Debrided reactive hyperkeratosis benign skin lesion left foot.  Debrided toenails 1 through 5.

## 2020-11-19 ENCOUNTER — Other Ambulatory Visit: Payer: Self-pay

## 2020-11-19 ENCOUNTER — Encounter: Payer: Self-pay | Admitting: Internal Medicine

## 2020-11-19 NOTE — Patient Instructions (Addendum)
  Blood work was ordered.     An abdominal ultrasound was ordered.  Medications changes include :   none  Your prescription(s) have been submitted to your pharmacy. Please take as directed and contact our office if you believe you are having problem(s) with the medication(s).     Please followup in 3 months

## 2020-11-19 NOTE — Progress Notes (Signed)
Subjective:    Patient ID: Brett Franklin, male    DOB: 1926/09/29, 87 y.o.   MRN: [id number]  HPI The patient is here for follow up of their chronic medical problems, including Afib, htn, hypothyroidism, DM, hyperlipidemia, anxiety.  He is here with his wife.   He is exercising regularly.      3 weeks ago he had a sharp pain in his right-middle upper stomach.  It lasted about three hrs.  It was intermittent.  The next two days it was better.  It went away for a week, but has come back occasionally since then.  The first day it was the most severe.  Last pain was three days ago.  It was in his RUQ.  He had it at rest.  It is not related to eating. Nothing made it better/worse.  He did not taken anything for it - did not last that long.  No change with movement.      Medications and allergies reviewed with patient and updated if appropriate.  Patient Active Problem List   Diagnosis Date Noted  . Ingrown nail of great toe of right foot 03/16/2020  . Chronic sphenoidal sinusitis 07/06/2019  . Presbycusis of both ears 06/08/2019  . Acquired hypothyroidism 05/19/2019  . Lightheadedness 05/06/2019  . Atrial flutter ([place])   . Paroxysmal atrial fibrillation (HCC)   . ETD (eustachian tube dysfunction) 02/17/2019  . Poor balance 10/08/2018  . Right foot pain 01/13/2017  . Hyperlipidemia 07/21/2011  . OTHER DYSPHAGIA 03/04/2010  . History of stroke 12/31/2009  . LOW BACK PAIN SYNDROME 11/12/2009  . CERVICAL RADICULOPATHY, LEFT 05/11/2008  . NEPHROLITHIASIS, HX OF 05/11/2008  . Diabetes mellitus with neurological manifestations, controlled ([place]) 11/16/2007  . Anxiety 07/08/2007  . Cough 03/10/2007  . Melanoma of skin ([place]) 01/21/2007  . Essential hypertension 01/21/2007  . IBS 01/21/2007  . BPH (benign prostatic hyperplasia) 01/21/2007  . STENOSIS, CERVICAL SPINAL 01/21/2007  . History of adenomatous polyp of colon 01/21/2007    Current Outpatient Medications on File Prior to  Visit  Medication Sig Dispense Refill  . acetaminophen (TYLENOL) 650 MG CR tablet Take 650 mg by mouth every 8 (eight) hours as needed for pain.     Marland Kitchen apixaban (ELIQUIS) 5 MG TABS tablet TAKE 1 TABLET(5 MG) BY MOUTH TWICE DAILY 180 tablet 1  . blood glucose meter kit and supplies KIT Dispense based on patient and insurance preference. Use to text blood sugars daily. 1 each 0  . diltiazem (CARDIZEM CD) 120 MG 24 hr capsule TAKE 1 CAPSULE(120 MG) BY MOUTH DAILY 90 capsule 3  . diltiazem (CARDIZEM) 30 MG tablet TAKE 1 TABLET BY MOUTH EVERY 6 HOURS AS NEEDED FOR FAST HEART RATE(ATRIAL FIBRILLATION) EPISODES 45 tablet 2  . glucose blood test strip Use to test blood sugars once daily. Dx code-E11.49 100 each 3  . Lancets (FREESTYLE) lancets Use to check blood sugars daily. 100 each 1  . levothyroxine (SYNTHROID) 25 MCG tablet TAKE 1 TABLET(25 MCG) BY MOUTH DAILY BEFORE BREAKFAST 90 tablet 1  . LORazepam (ATIVAN) 0.5 MG tablet Take 1 tablet (0.5 mg total) by mouth every 12 (twelve) hours as needed for anxiety. 20 tablet 1  . metoprolol tartrate (LOPRESSOR) 25 MG tablet Take 25 mg by mouth 2 (two) times daily.    . mometasone (NASONEX) 50 MCG/ACT nasal spray SMARTSIG:2 Spray(s) Both Nares Every Night    . MULTAQ 400 MG tablet TAKE 1 TABLET(400 MG) BY MOUTH TWICE  DAILY WITH A MEAL 60 tablet 5  . mupirocin ointment (BACTROBAN) 2 % Apply to wound after soaking BID 30 g 1  . polyethylene glycol (MIRALAX / GLYCOLAX) 17 g packet Take 17 g by mouth daily.    . pravastatin (PRAVACHOL) 20 MG tablet TAKE 1 TABLET(20 MG) BY MOUTH DAILY 90 tablet 1  . sitaGLIPtin-metformin (JANUMET) 50-500 MG tablet Take 1 tablet by mouth daily. 90 tablet 1   No current facility-administered medications on file prior to visit.    Past Medical History:  Diagnosis Date  . Adenomatous polyp of colon 11/1996   Dr Fuller Plan  . Anxiety   . BPH (benign prostatic hyperplasia)   . Cervical spinal stenosis   . CVA (cerebral vascular  accident) ([place])   . Diabetes mellitus, type 2 ([place])   . GERD (gastroesophageal reflux disease)   . Hearing loss   . Hemorrhoids   . Hypertension   . IBS (irritable bowel syndrome)   . Melanoma ([place])   . Nephrolithiasis     X 4    Past Surgical History:  Procedure Laterality Date  . CARDIOVERSION N/A 04/13/2019   Procedure: CARDIOVERSION;  Surgeon: Buford Dresser, MD;  Location: [facility] ENDOSCOPY;  Service: Cardiovascular;  Laterality: N/A;  . CATARACT EXTRACTION Left   . CHOLECYSTECTOMY    . COLONOSCOPY W/ POLYPECTOMY     Dr.Stark  . CYSTOSCOPY     Dr.Kimbrough     Social History   Socioeconomic History  . Marital status: Married    Spouse name: Not on file  . Number of children: 2  . Years of education: Not on file  . Highest education level: Not on file  Occupational History  . Occupation: retired    Fish farm manager: RETIRED  Tobacco Use  . Smoking status: Never Smoker  . Smokeless tobacco: Never Used  Vaping Use  . Vaping Use: Never used  Substance and Sexual Activity  . Alcohol use: No  . Drug use: No  . Sexual activity: Not Currently  Other Topics Concern  . Not on file  Social History Narrative  . Not on file   Social Determinants of Health   Financial Resource Strain: Not on file  Food Insecurity: Not on file  Transportation Needs: Not on file  Physical Activity: Not on file  Stress: Not on file  Social Connections: Not on file    Family History  Problem Relation Age of Onset  . Alcohol abuse Father   . Heart disease Father   . Heart attack Maternal Grandfather 65  . Diabetes type I Other        grandson  . Cancer Neg Hx   . Stroke Neg Hx     Review of Systems  Constitutional: Negative for appetite change and fever.  HENT: Negative for trouble swallowing.   Respiratory: Positive for cough (sometimes after eating - may occur 20 min after eating, not while eating). Negative for shortness of breath and wheezing.   Cardiovascular: Negative for  chest pain, palpitations and leg swelling.  Gastrointestinal: Positive for abdominal pain. Negative for blood in stool, constipation, diarrhea and nausea.       No gerd, BM normal  Genitourinary: Negative for difficulty urinating, dysuria and hematuria.  Neurological: Negative for light-headedness and headaches.       Objective:   Vitals:   11/20/20 0753  BP: 126/74  Pulse: 60  Temp: 98.1 F (36.7 C)  SpO2: 99%   BP Readings from Last 3 Encounters:  11/20/20  126/74  08/17/20 124/76  08/14/20 118/68   Wt Readings from Last 3 Encounters:  11/20/20 161 lb (73 kg)  08/17/20 159 lb (72.1 kg)  08/14/20 159 lb (72.1 kg)   Body mass index is 23.1 kg/m.   Physical Exam    Constitutional: Appears well-developed and well-nourished. No distress.  HENT:  Head: Normocephalic and atraumatic.  Neck: Neck supple. No tracheal deviation present. No thyromegaly present.  No cervical lymphadenopathy Cardiovascular: Normal rate, regular rhythm and normal heart sounds.   No murmur heard. No carotid bruit .  No edema Pulmonary/Chest: Effort normal and breath sounds normal. No respiratory distress. No has no wheezes. No rales. Abdomen: Soft, flat, slight discomfort just lateral and superior to umbilicus on the right side.  No rebound or guarding.  No other tenderness with palpation.  Nondistended Skin: Skin is warm and dry. Not diaphoretic.  Psychiatric: Normal mood and affect. Behavior is normal.      Assessment & Plan:    See Problem List for Assessment and Plan of chronic medical problems.    This visit occurred during the SARS-CoV-2 public health emergency.  Safety protocols were in place, including screening questions prior to the visit, additional usage of staff PPE, and extensive cleaning of exam room while observing appropriate contact time as indicated for disinfecting solutions.

## 2020-11-20 ENCOUNTER — Ambulatory Visit (INDEPENDENT_AMBULATORY_CARE_PROVIDER_SITE_OTHER): Payer: Medicare Other | Admitting: Internal Medicine

## 2020-11-20 ENCOUNTER — Other Ambulatory Visit: Payer: Self-pay

## 2020-11-20 VITALS — BP 126/74 | HR 60 | Temp 98.1°F | Ht 70.0 in | Wt 161.0 lb

## 2020-11-20 DIAGNOSIS — I480 Paroxysmal atrial fibrillation: Secondary | ICD-10-CM | POA: Diagnosis not present

## 2020-11-20 DIAGNOSIS — E039 Hypothyroidism, unspecified: Secondary | ICD-10-CM | POA: Diagnosis not present

## 2020-11-20 DIAGNOSIS — E7849 Other hyperlipidemia: Secondary | ICD-10-CM

## 2020-11-20 DIAGNOSIS — I10 Essential (primary) hypertension: Secondary | ICD-10-CM

## 2020-11-20 DIAGNOSIS — E1149 Type 2 diabetes mellitus with other diabetic neurological complication: Secondary | ICD-10-CM

## 2020-11-20 DIAGNOSIS — F419 Anxiety disorder, unspecified: Secondary | ICD-10-CM

## 2020-11-20 DIAGNOSIS — R1011 Right upper quadrant pain: Secondary | ICD-10-CM

## 2020-11-20 LAB — COMPREHENSIVE METABOLIC PANEL
ALT: 9 U/L (ref 0–53)
AST: 14 U/L (ref 0–37)
Albumin: 4.1 g/dL (ref 3.5–5.2)
Alkaline Phosphatase: 47 U/L (ref 39–117)
BUN: 24 mg/dL — ABNORMAL HIGH (ref 6–23)
CO2: 26 mEq/L (ref 19–32)
Calcium: 9.3 mg/dL (ref 8.4–10.5)
Chloride: 102 mEq/L (ref 96–112)
Creatinine, Ser: 1.18 mg/dL (ref 0.40–1.50)
GFR: 53.39 mL/min — ABNORMAL LOW (ref >60.00)
Glucose, Bld: 119 mg/dL — ABNORMAL HIGH (ref 70–99)
Potassium: 4.3 mEq/L (ref 3.5–5.1)
Sodium: 137 mEq/L (ref 135–145)
Total Bilirubin: 0.7 mg/dL (ref 0.2–1.2)
Total Protein: 6.8 g/dL (ref 6.0–8.3)

## 2020-11-20 LAB — LIPID PANEL
Cholesterol: 113 mg/dL (ref 0–200)
HDL: 66.6 mg/dL (ref >39.00)
LDL Cholesterol: 38 mg/dL (ref 0–99)
NonHDL: 46.34
Total CHOL/HDL Ratio: 2
Triglycerides: 43 mg/dL (ref 0.0–149.0)
VLDL: 8.6 mg/dL (ref 0.0–40.0)

## 2020-11-20 LAB — CBC WITH DIFFERENTIAL/PLATELET
Basophils Absolute: 0 10*3/uL (ref 0.0–0.1)
Basophils Relative: 0.2 % (ref 0.0–3.0)
Eosinophils Absolute: 0.1 10*3/uL (ref 0.0–0.7)
Eosinophils Relative: 0.6 % (ref 0.0–5.0)
HCT: 41 % (ref 39.0–52.0)
Hemoglobin: 14 g/dL (ref 13.0–17.0)
Lymphocytes Relative: 15.8 % (ref 12.0–46.0)
Lymphs Abs: 1.4 10*3/uL (ref 0.7–4.0)
MCHC: 34.1 g/dL (ref 30.0–36.0)
MCV: 95.4 fl (ref 78.0–100.0)
Monocytes Absolute: 0.9 10*3/uL (ref 0.1–1.0)
Monocytes Relative: 10.2 % (ref 3.0–12.0)
Neutro Abs: 6.4 10*3/uL (ref 1.4–7.7)
Neutrophils Relative %: 73.2 % (ref 43.0–77.0)
Platelets: 201 10*3/uL (ref 150.0–400.0)
RBC: 4.3 Mil/uL (ref 4.22–5.81)
RDW: 14.6 % (ref 11.5–15.5)
WBC: 8.8 10*3/uL (ref 4.0–10.5)

## 2020-11-20 LAB — HEMOGLOBIN A1C: Hgb A1c MFr Bld: 6.3 % (ref 4.6–6.5)

## 2020-11-20 LAB — TSH: TSH: 5.57 u[IU]/mL — ABNORMAL HIGH (ref 0.35–4.50)

## 2020-11-20 MED ORDER — PRAVASTATIN SODIUM 20 MG PO TABS: ORAL_TABLET | ORAL | 1 refills | Status: DC

## 2020-11-20 MED ORDER — LEVOTHYROXINE SODIUM 25 MCG PO TABS
ORAL_TABLET | ORAL | 1 refills | Status: DC
Start: 2020-11-20 — End: 2020-11-22

## 2020-11-20 NOTE — Assessment & Plan Note (Signed)
Chronic LDL well controlled Continue pravastatin 20 mg daily Regular exercise and healthy diet encouraged

## 2020-11-20 NOTE — Assessment & Plan Note (Signed)
New problem Had sharp pain in upper-right upper-middle abdominal area 3 weeks ago that lasted for 3 hours He has had some intermittent pain since then, but it is improving-less intense and less frequent.  No pain for the past couple of days  Status post cholecystectomy.  Denies GERD, nausea or changes in bowels.  No urinary symptoms.  No relation to eating or changes in position Hard to say what his pain is from We will check abdominal ultrasound, CBC, CMP If pain persists will likely need a CT scan

## 2020-11-20 NOTE — Assessment & Plan Note (Signed)
Chronic Controlled, stable Continue ativan 0.5 mg daily prn - he takes infrequently

## 2020-11-20 NOTE — Assessment & Plan Note (Signed)
Chronic Following with cardiology No symptoms Rate controlled On diltiazem, metoprolol and eliquis w/o side effects

## 2020-11-20 NOTE — Assessment & Plan Note (Addendum)
Chronic Controlled Lab Results  Component Value Date   HGBA1C 6.1 08/17/2020   Continue janumet 50-500 mg daily Continue diabetic diet and regular exercise We will check A1c today

## 2020-11-20 NOTE — Assessment & Plan Note (Addendum)
Chronic Lab Results  Component Value Date   TSH 4.07 08/17/2020   Continue current dose of levothyroxine at 25 mcg daily Will recheck TSH today since we are getting blood work Clinically euthyroid

## 2020-11-20 NOTE — Assessment & Plan Note (Addendum)
Chronic BP well controlled Continue diltiazem 120 mg qd, metoprolol 25 mg bid  CMP

## 2020-11-22 ENCOUNTER — Other Ambulatory Visit: Payer: Self-pay

## 2020-11-22 MED ORDER — LEVOTHYROXINE SODIUM 50 MCG PO TABS: 50.0000 ug | ORAL_TABLET | Freq: Every day | ORAL | 3 refills | Status: DC

## 2020-11-22 MED ORDER — MULTAQ 400 MG PO TABS: ORAL_TABLET | ORAL | 5 refills | Status: DC

## 2020-11-22 MED ORDER — APIXABAN 5 MG PO TABS: ORAL_TABLET | ORAL | 3 refills | Status: DC

## 2020-11-22 NOTE — Addendum Note (Signed)
Addended by: Binnie Rail on: 11/22/2020 07:38 AM   Modules accepted: Orders

## 2020-11-27 ENCOUNTER — Other Ambulatory Visit: Payer: Self-pay | Admitting: Internal Medicine

## 2020-11-30 ENCOUNTER — Ambulatory Visit
Admission: RE | Admit: 2020-11-30 | Discharge: 2020-11-30 | Disposition: A | Discharge status: Home / Self Care | Payer: Medicare Other | Source: Ambulatory Visit | Attending: Internal Medicine | Admitting: Internal Medicine

## 2020-11-30 ENCOUNTER — Other Ambulatory Visit: Payer: Self-pay

## 2020-11-30 DIAGNOSIS — R1011 Right upper quadrant pain: Secondary | ICD-10-CM

## 2020-12-03 ENCOUNTER — Other Ambulatory Visit: Payer: Self-pay | Admitting: Internal Medicine

## 2020-12-03 DIAGNOSIS — K769 Liver disease, unspecified: Secondary | ICD-10-CM

## 2020-12-03 DIAGNOSIS — N281 Cyst of kidney, acquired: Secondary | ICD-10-CM

## 2020-12-04 ENCOUNTER — Telehealth: Payer: Self-pay | Admitting: Internal Medicine

## 2020-12-04 MED ORDER — DIAZEPAM 5 MG PO TABS: ORAL_TABLET | ORAL | 0 refills | Status: AC

## 2020-12-04 NOTE — Telephone Encounter (Signed)
Patient is having an MRI done at the end of the month and is requesting an sedative to help him. Patient is claustrophobic.   Preferred Pharmacy:  [facility] [place], [place] AT [address] Phone:  [phone]  Fax:  [phone]

## 2020-12-12 ENCOUNTER — Telehealth: Payer: Self-pay | Admitting: Internal Medicine

## 2020-12-12 MED ORDER — GLUCOSE BLOOD VI STRP: ORAL_STRIP | 3 refills | Status: DC

## 2020-12-12 MED ORDER — FREESTYLE LANCETS MISC: 1 refills | Status: DC

## 2020-12-12 MED ORDER — BLOOD GLUCOSE MONITOR KIT: PACK | 0 refills | Status: DC

## 2020-12-12 NOTE — Telephone Encounter (Signed)
Patients wife called and said the patient is needing a new blood glucose meter kit and supplies KIT. It can be sent to [facility], [address] AT [address] [place]. Please advise

## 2020-12-12 NOTE — Telephone Encounter (Signed)
Faxed in for patient today.

## 2020-12-17 NOTE — Progress Notes (Signed)
Entered in error

## 2020-12-24 ENCOUNTER — Other Ambulatory Visit: Payer: Self-pay | Admitting: Internal Medicine

## 2020-12-24 ENCOUNTER — Other Ambulatory Visit: Payer: Self-pay | Admitting: Cardiology

## 2020-12-26 ENCOUNTER — Other Ambulatory Visit: Payer: Self-pay

## 2020-12-26 ENCOUNTER — Ambulatory Visit
Admission: RE | Admit: 2020-12-26 | Discharge: 2020-12-26 | Disposition: A | Discharge status: Home / Self Care | Payer: Medicare Other | Source: Ambulatory Visit | Attending: Internal Medicine | Admitting: Internal Medicine

## 2020-12-26 DIAGNOSIS — N281 Cyst of kidney, acquired: Secondary | ICD-10-CM

## 2020-12-26 DIAGNOSIS — K769 Liver disease, unspecified: Secondary | ICD-10-CM

## 2020-12-26 MED ORDER — GADOBENATE DIMEGLUMINE 529 MG/ML IV SOLN
15.0000 mL | Freq: Once | INTRAVENOUS | Status: AC | PRN
  Administered 2020-12-26: 15 mL via INTRAVENOUS

## 2020-12-27 ENCOUNTER — Ambulatory Visit (INDEPENDENT_AMBULATORY_CARE_PROVIDER_SITE_OTHER): Payer: Medicare Other | Admitting: Podiatry

## 2020-12-27 DIAGNOSIS — E1142 Type 2 diabetes mellitus with diabetic polyneuropathy: Secondary | ICD-10-CM

## 2020-12-27 DIAGNOSIS — D2371 Other benign neoplasm of skin of right lower limb, including hip: Secondary | ICD-10-CM

## 2020-12-27 DIAGNOSIS — B351 Tinea unguium: Secondary | ICD-10-CM

## 2020-12-27 DIAGNOSIS — D2372 Other benign neoplasm of skin of left lower limb, including hip: Secondary | ICD-10-CM

## 2020-12-27 DIAGNOSIS — D689 Coagulation defect, unspecified: Secondary | ICD-10-CM

## 2020-12-27 DIAGNOSIS — M79676 Pain in unspecified toe(s): Secondary | ICD-10-CM

## 2020-12-27 NOTE — Progress Notes (Signed)
He presents today chief complaint of painfully elongated toenails.  Objective: Toenails are long thick yellow dystrophic-like mycotic.  Pulses are palpable.  Assessment: Pain in limb secondary onychomycosis.  Plan: Debridement of toenails 1 through 5 bilateral.

## 2021-01-08 ENCOUNTER — Other Ambulatory Visit: Payer: Self-pay | Admitting: Cardiology

## 2021-01-20 ENCOUNTER — Other Ambulatory Visit: Payer: Self-pay | Admitting: Internal Medicine

## 2021-02-05 ENCOUNTER — Encounter: Payer: Self-pay | Admitting: Podiatry

## 2021-02-05 ENCOUNTER — Ambulatory Visit (INDEPENDENT_AMBULATORY_CARE_PROVIDER_SITE_OTHER): Payer: Medicare Other | Admitting: Podiatry

## 2021-02-05 ENCOUNTER — Other Ambulatory Visit: Payer: Self-pay

## 2021-02-05 DIAGNOSIS — M79676 Pain in unspecified toe(s): Secondary | ICD-10-CM

## 2021-02-05 DIAGNOSIS — E1142 Type 2 diabetes mellitus with diabetic polyneuropathy: Secondary | ICD-10-CM

## 2021-02-05 DIAGNOSIS — B351 Tinea unguium: Secondary | ICD-10-CM

## 2021-02-05 DIAGNOSIS — D689 Coagulation defect, unspecified: Secondary | ICD-10-CM | POA: Diagnosis not present

## 2021-02-05 NOTE — Progress Notes (Signed)
I will follow-up with her on an as-needed basis.  He presents today chief complaint of painful elongated toenails and calluses bilateral.  Objective: Toenails are long thick yellow dystrophic-like mycotic painful palpation.  Reactive hyper keratomas the fifth metatarsal head of the left foot is exquisitely painful.  Assessment: Pain limb secondary to diabetic peripheral neuropathy onychomycosis and plantarflexed fifth metatarsal resulting in a benign skin lesion.  Plan: Debridement of benign skin lesion.  Debridement of toenails 1 through 5 bilateral.

## 2021-02-17 NOTE — Patient Instructions (Addendum)
° ° ° °  Medications changes include :  none  ° ° ° ° °Please followup in 3 months ° °

## 2021-02-17 NOTE — Progress Notes (Signed)
Subjective:    Patient ID: Brett Franklin, male    DOB: 03/11/1926, 87 y.o.   MRN: [id number]  HPI The patient is here for follow up of their chronic medical problems, including Afib, htn, hyperlipidemia, DM, hypothyroidism, anxiety  He is taking all of his medications as prescribed.    He walks regularly.   No falls.    Occasional feeling of something in their throat.  Usually first thing int the morning.  No gerd.  Ok during day.  No trouble swallowing.    Medications and allergies reviewed with patient and updated if appropriate.  Patient Active Problem List   Diagnosis Date Noted  . Colicky RUQ abdominal pain 11/20/2020  . Ingrown nail of great toe of right foot 03/16/2020  . Chronic sphenoidal sinusitis 07/06/2019  . Presbycusis of both ears 06/08/2019  . Acquired hypothyroidism 05/19/2019  . Lightheadedness 05/06/2019  . Atrial flutter ([place])   . Paroxysmal atrial fibrillation (HCC)   . ETD (eustachian tube dysfunction) 02/17/2019  . Poor balance 10/08/2018  . Right foot pain 01/13/2017  . Hyperlipidemia 07/21/2011  . OTHER DYSPHAGIA 03/04/2010  . History of stroke 12/31/2009  . LOW BACK PAIN SYNDROME 11/12/2009  . CERVICAL RADICULOPATHY, LEFT 05/11/2008  . NEPHROLITHIASIS, HX OF 05/11/2008  . Diabetes mellitus with neurological manifestations, controlled ([place]) 11/16/2007  . Anxiety 07/08/2007  . Cough 03/10/2007  . Melanoma of skin ([place]) 01/21/2007  . Essential hypertension 01/21/2007  . IBS 01/21/2007  . BPH (benign prostatic hyperplasia) 01/21/2007  . STENOSIS, CERVICAL SPINAL 01/21/2007  . History of adenomatous polyp of colon 01/21/2007    Current Outpatient Medications on File Prior to Visit  Medication Sig Dispense Refill  . acetaminophen (TYLENOL) 650 MG CR tablet Take 650 mg by mouth every 8 (eight) hours as needed for pain.     Marland Kitchen apixaban (ELIQUIS) 5 MG TABS tablet TAKE 1 TABLET(5 MG) BY MOUTH TWICE DAILY 180 tablet 3  . blood glucose meter kit and  supplies KIT Dispense based on patient and insurance preference. Use to text blood sugars daily. 1 each 0  . diltiazem (CARDIZEM CD) 120 MG 24 hr capsule TAKE 1 CAPSULE(120 MG) BY MOUTH DAILY 90 capsule 1  . diltiazem (TIAZAC) 120 MG 24 hr capsule     . dronedarone (MULTAQ) 400 MG tablet TAKE 1 TABLET(400 MG) BY MOUTH TWICE DAILY WITH A MEAL 60 tablet 5  . glucose blood test strip Use to test blood sugars once daily. Dx code-E11.49 100 each 3  . JANUMET 50-500 MG tablet TAKE 1 TABLET BY MOUTH DAILY 90 tablet 1  . Lancets (FREESTYLE) lancets Use to check blood sugars daily. 100 each 1  . LORazepam (ATIVAN) 0.5 MG tablet Take 1 tablet (0.5 mg total) by mouth every 12 (twelve) hours as needed for anxiety. 20 tablet 1  . metoprolol tartrate (LOPRESSOR) 25 MG tablet Take 25 mg by mouth 2 (two) times daily.    . mometasone (NASONEX) 50 MCG/ACT nasal spray SMARTSIG:2 Spray(s) Both Nares Every Night    . mupirocin ointment (BACTROBAN) 2 % Apply to wound after soaking BID 30 g 1  . polyethylene glycol (MIRALAX / GLYCOLAX) 17 g packet Take 17 g by mouth daily.    . pravastatin (PRAVACHOL) 20 MG tablet TAKE 1 TABLET(20 MG) BY MOUTH DAILY 90 tablet 1  . levothyroxine (SYNTHROID) 50 MCG tablet Take 50 mcg by mouth daily.     No current facility-administered medications on file prior to visit.  Past Medical History:  Diagnosis Date  . Adenomatous polyp of colon 11/1996   Dr Fuller Plan  . Anxiety   . BPH (benign prostatic hyperplasia)   . Cervical spinal stenosis   . CVA (cerebral vascular accident) ([place])   . Diabetes mellitus, type 2 ([place])   . GERD (gastroesophageal reflux disease)   . Hearing loss   . Hemorrhoids   . Hypertension   . IBS (irritable bowel syndrome)   . Melanoma ([place])   . Nephrolithiasis     X 4    Past Surgical History:  Procedure Laterality Date  . CARDIOVERSION N/A 04/13/2019   Procedure: CARDIOVERSION;  Surgeon: Buford Dresser, MD;  Location: [facility] ENDOSCOPY;  Service:  Cardiovascular;  Laterality: N/A;  . CATARACT EXTRACTION Left   . CHOLECYSTECTOMY    . COLONOSCOPY W/ POLYPECTOMY     Dr.Stark  . CYSTOSCOPY     Dr.Kimbrough     Social History   Socioeconomic History  . Marital status: Married    Spouse name: Not on file  . Number of children: 2  . Years of education: Not on file  . Highest education level: Not on file  Occupational History  . Occupation: retired    Fish farm manager: RETIRED  Tobacco Use  . Smoking status: Never Smoker  . Smokeless tobacco: Never Used  Vaping Use  . Vaping Use: Never used  Substance and Sexual Activity  . Alcohol use: No  . Drug use: No  . Sexual activity: Not Currently  Other Topics Concern  . Not on file  Social History Narrative  . Not on file   Social Determinants of Health   Financial Resource Strain: Not on file  Food Insecurity: Not on file  Transportation Needs: Not on file  Physical Activity: Not on file  Stress: Not on file  Social Connections: Not on file    Family History  Problem Relation Age of Onset  . Alcohol abuse Father   . Heart disease Father   . Heart attack Maternal Grandfather 65  . Diabetes type I Other        grandson  . Cancer Neg Hx   . Stroke Neg Hx     Review of Systems  Constitutional: Negative for chills and fever.  Respiratory: Negative for cough, shortness of breath and wheezing.   Cardiovascular: Negative for chest pain, palpitations and leg swelling.  Neurological: Negative for dizziness, light-headedness and headaches.       Poor balance  Hematological: Bruises/bleeds easily (bruises easily).       Objective:   Vitals:   02/18/21 0753  BP: 120/74  Pulse: 65  Temp: 98.1 F (36.7 C)  SpO2: 99%   BP Readings from Last 3 Encounters:  02/18/21 120/74  11/20/20 126/74  08/17/20 124/76   Wt Readings from Last 3 Encounters:  02/18/21 159 lb 6.4 oz (72.3 kg)  11/20/20 161 lb (73 kg)  08/17/20 159 lb (72.1 kg)   Body mass index is 22.87 kg/m.    Physical Exam    Constitutional: Appears well-developed and well-nourished. No distress.  HENT:  Head: Normocephalic and atraumatic.  Neck: Neck supple. No tracheal deviation present. No thyromegaly present.  No cervical lymphadenopathy Cardiovascular: Normal rate, regular rhythm and normal heart sounds.   No murmur heard. No carotid bruit .  No edema Pulmonary/Chest: Effort normal and breath sounds normal. No respiratory distress. No has no wheezes. No rales.  Skin: Skin is warm and dry. Not diaphoretic.  Psychiatric: Normal mood and  affect. Behavior is normal.      Assessment & Plan:    See Problem List for Assessment and Plan of chronic medical problems.    This visit occurred during the SARS-CoV-2 public health emergency.  Safety protocols were in place, including screening questions prior to the visit, additional usage of staff PPE, and extensive cleaning of exam room while observing appropriate contact time as indicated for disinfecting solutions.

## 2021-02-18 ENCOUNTER — Other Ambulatory Visit: Payer: Self-pay

## 2021-02-18 ENCOUNTER — Encounter: Payer: Self-pay | Admitting: Internal Medicine

## 2021-02-18 ENCOUNTER — Ambulatory Visit (INDEPENDENT_AMBULATORY_CARE_PROVIDER_SITE_OTHER): Payer: Medicare Other | Admitting: Internal Medicine

## 2021-02-18 VITALS — BP 120/74 | HR 65 | Temp 98.1°F | Ht 70.0 in | Wt 159.4 lb

## 2021-02-18 DIAGNOSIS — E039 Hypothyroidism, unspecified: Secondary | ICD-10-CM | POA: Diagnosis not present

## 2021-02-18 DIAGNOSIS — I480 Paroxysmal atrial fibrillation: Secondary | ICD-10-CM

## 2021-02-18 DIAGNOSIS — I10 Essential (primary) hypertension: Secondary | ICD-10-CM | POA: Diagnosis not present

## 2021-02-18 DIAGNOSIS — F419 Anxiety disorder, unspecified: Secondary | ICD-10-CM

## 2021-02-18 DIAGNOSIS — E1149 Type 2 diabetes mellitus with other diabetic neurological complication: Secondary | ICD-10-CM

## 2021-02-18 DIAGNOSIS — E7849 Other hyperlipidemia: Secondary | ICD-10-CM

## 2021-02-18 NOTE — Assessment & Plan Note (Addendum)
Chronic BP well controlled Continue diltiazem 120 mg daily, metoprolol 25 mg twice daily

## 2021-02-18 NOTE — Assessment & Plan Note (Addendum)
Chronic Lipids controlled and at goal Continue pravastatin 20 mg daily Regular exercise and healthy diet encouraged

## 2021-02-18 NOTE — Assessment & Plan Note (Signed)
Chronic Controlled Continue Janumet 50-500 mg daily Continue regular exercise and diabetic diet

## 2021-02-18 NOTE — Assessment & Plan Note (Addendum)
Chronic Clinically euthyroid Continue levothyroxine 50 mcg daily Dose increased after last blood work - will wait until next visit to check tsh

## 2021-02-18 NOTE — Assessment & Plan Note (Signed)
Chronic Following with cardiology Asymptomatic Rate controlled Continue diltiazem, metoprolol and Eliquis

## 2021-02-18 NOTE — Assessment & Plan Note (Signed)
Chronic Controlled Continue Ativan 0.5 mg daily as needed, which he takes infrequently

## 2021-02-28 ENCOUNTER — Encounter: Payer: Self-pay | Admitting: Cardiology

## 2021-02-28 ENCOUNTER — Other Ambulatory Visit: Payer: Self-pay

## 2021-02-28 ENCOUNTER — Ambulatory Visit (INDEPENDENT_AMBULATORY_CARE_PROVIDER_SITE_OTHER): Payer: Medicare Other | Admitting: Cardiology

## 2021-02-28 VITALS — BP 138/68 | HR 63 | Ht 70.0 in | Wt 163.0 lb

## 2021-02-28 DIAGNOSIS — I4819 Other persistent atrial fibrillation: Secondary | ICD-10-CM

## 2021-02-28 NOTE — Progress Notes (Signed)
Electrophysiology Office Note   Date:  02/28/2021   ID:  Brett Franklin, DOB Jan 12, 1926, MRN [id number]  PCP:  Binnie Rail, MD  Cardiologist:   Primary Electrophysiologist:  Keirstan Iannello Meredith Leeds, MD    No chief complaint on file.    History of Present Illness: Brett Franklin is a 87 y.o. male who is being seen today for the evaluation of AF/flutter at the request of Burns, Claudina Lick, MD. Presenting today for electrophysiology evaluation.  He has a history of hypertension.  He presented to his primary physician's office with rapid heart rates.  ECG showed atrial fibrillation.  He had weakness and fatigue at that time.  His metoprolol dose was increased.  He had continued palpitations and had a cardioversion 04/13/2019.  He presented emergency room 06/01/2019 with atrial fibrillation and converted with IV metoprolol to sinus rhythm.  He was started on Multaq.  Today, denies symptoms of palpitations, chest pain, shortness of breath, orthopnea, PND, lower extremity edema, claudication, dizziness, presyncope, syncope, bleeding, or neurologic sequela. The patient is tolerating medications without difficulties.  Since last being seen he has done well.  He notes no further episodes of atrial fibrillation.  He is able do all of his daily activities without restriction.  He has continued to be active.   Past Medical History:  Diagnosis Date  . Adenomatous polyp of colon 11/1996   Dr Fuller Plan  . Anxiety   . BPH (benign prostatic hyperplasia)   . Cervical spinal stenosis   . CVA (cerebral vascular accident) ([place])   . Diabetes mellitus, type 2 ([place])   . GERD (gastroesophageal reflux disease)   . Hearing loss   . Hemorrhoids   . Hypertension   . IBS (irritable bowel syndrome)   . Melanoma ([place])   . Nephrolithiasis     X 4   Past Surgical History:  Procedure Laterality Date  . CARDIOVERSION N/A 04/13/2019   Procedure: CARDIOVERSION;  Surgeon: Buford Dresser, MD;  Location: [facility] ENDOSCOPY;   Service: Cardiovascular;  Laterality: N/A;  . CATARACT EXTRACTION Left   . CHOLECYSTECTOMY    . COLONOSCOPY W/ POLYPECTOMY     Dr.Stark  . CYSTOSCOPY     Dr.Kimbrough      Current Outpatient Medications  Medication Sig Dispense Refill  . acetaminophen (TYLENOL) 650 MG CR tablet Take 650 mg by mouth every 8 (eight) hours as needed for pain.     Marland Kitchen apixaban (ELIQUIS) 5 MG TABS tablet TAKE 1 TABLET(5 MG) BY MOUTH TWICE DAILY 180 tablet 3  . blood glucose meter kit and supplies KIT Dispense based on patient and insurance preference. Use to text blood sugars daily. 1 each 0  . diltiazem (CARDIZEM CD) 120 MG 24 hr capsule TAKE 1 CAPSULE(120 MG) BY MOUTH DAILY 90 capsule 1  . diltiazem (TIAZAC) 120 MG 24 hr capsule     . dronedarone (MULTAQ) 400 MG tablet TAKE 1 TABLET(400 MG) BY MOUTH TWICE DAILY WITH A MEAL 60 tablet 5  . glucose blood test strip Use to test blood sugars once daily. Dx code-E11.49 100 each 3  . JANUMET 50-500 MG tablet TAKE 1 TABLET BY MOUTH DAILY 90 tablet 1  . Lancets (FREESTYLE) lancets Use to check blood sugars daily. 100 each 1  . levothyroxine (SYNTHROID) 50 MCG tablet Take 50 mcg by mouth daily.    Marland Kitchen LORazepam (ATIVAN) 0.5 MG tablet Take 1 tablet (0.5 mg total) by mouth every 12 (twelve) hours as needed for anxiety. [place]  tablet 1  . metoprolol tartrate (LOPRESSOR) 25 MG tablet Take 25 mg by mouth 2 (two) times daily.    . mometasone (NASONEX) 50 MCG/ACT nasal spray SMARTSIG:2 Spray(s) Both Nares Every Night    . mupirocin ointment (BACTROBAN) 2 % Apply to wound after soaking BID 30 g 1  . polyethylene glycol (MIRALAX / GLYCOLAX) 17 g packet Take 17 g by mouth daily.    . pravastatin (PRAVACHOL) 20 MG tablet TAKE 1 TABLET(20 MG) BY MOUTH DAILY 90 tablet 1   No current facility-administered medications for this visit.    Allergies:   Lisinopril   Social History:  The patient  reports that he has never smoked. He has never used smokeless tobacco. He reports that he  does not drink alcohol and does not use drugs.   Family History:  The patient's family history includes Alcohol abuse in his father; Diabetes type I in an other family member; Heart attack (age of onset: 63) in his maternal grandfather; Heart disease in his father.   ROS:  Please see the history of present illness.   Otherwise, review of systems is positive for none.   All other systems are reviewed and negative.   PHYSICAL EXAM: VS:  BP 138/68   Pulse 63   Ht _0  (1.778 m)   Wt 163 lb (73.9 kg)   SpO2 95%   BMI 23.39 kg/m  , BMI Body mass index is 23.39 kg/m. GEN: Well nourished, well developed, in no acute distress  HEENT: normal  Neck: no JVD, carotid bruits, or masses Cardiac: RRR; no murmurs, rubs, or gallops,no edema  Respiratory:  clear to auscultation bilaterally, normal work of breathing GI: soft, nontender, nondistended, + BS MS: no deformity or atrophy  Skin: warm and dry Neuro:  Strength and sensation are intact Psych: euthymic mood, full affect  EKG:  EKG is ordered today. Personal review of the ekg ordered shows sinus rhythm, rate 63  Recent Labs: 11/20/2020: ALT 9; BUN 24; Creatinine, Ser 1.18; Hemoglobin 14.0; Platelets 201.0; Potassium 4.3; Sodium 137; TSH 5.57    Lipid Panel     Component Value Date/Time   CHOL 113 11/20/2020 0831   TRIG 43.0 11/20/2020 0831   TRIG 36 09/04/2006 0915   HDL 66.60 11/20/2020 0831   CHOLHDL 2 11/20/2020 0831   VLDL 8.6 11/20/2020 0831   LDLCALC 38 11/20/2020 0831   LDLCALC 32 05/17/2020 0901     Wt Readings from Last 3 Encounters:  02/28/21 163 lb (73.9 kg)  02/18/21 159 lb 6.4 oz (72.3 kg)  11/20/20 161 lb (73 kg)      Other studies Reviewed: Additional studies/ records that were reviewed today include: PCP records  ASSESSMENT AND PLAN:  1.  Persistent atrial fibrillation/flutter: Currently on Multaq and Eliquis.  High risk medication monitoring.  CHA2DS2-VASc of 6.  He currently feels well.  He has noted  no further episodes of atrial fibrillation.  He is tolerating the Multaq without issue.   2.  Hypertension: Currently well controlled   Current medicines are reviewed at length with the patient today.   The patient does not have concerns regarding his medicines.  The following changes were made today: None  Labs/ tests ordered today include:  Orders Placed This Encounter  Procedures  . EKG 12-Lead     Disposition:   FU with Taite Baldassari 6 months  Signed, Delvonte Berenson Meredith Leeds, MD  02/28/2021 11:13 AM     [facility] [address] [place]  [place] [address] [phone] (office) [phone] (fax)

## 2021-03-05 ENCOUNTER — Telehealth: Payer: Self-pay | Admitting: Internal Medicine

## 2021-03-05 NOTE — Telephone Encounter (Signed)
LVM for pt to rtn my call to schedule AWV with [facility]. Please schedule AWV if pt calls the office  

## 2021-05-09 ENCOUNTER — Encounter: Payer: Self-pay | Admitting: Podiatry

## 2021-05-09 ENCOUNTER — Ambulatory Visit (INDEPENDENT_AMBULATORY_CARE_PROVIDER_SITE_OTHER): Payer: Medicare Other | Admitting: Podiatry

## 2021-05-09 ENCOUNTER — Other Ambulatory Visit: Payer: Self-pay

## 2021-05-09 DIAGNOSIS — D2371 Other benign neoplasm of skin of right lower limb, including hip: Secondary | ICD-10-CM | POA: Diagnosis not present

## 2021-05-09 DIAGNOSIS — E1142 Type 2 diabetes mellitus with diabetic polyneuropathy: Secondary | ICD-10-CM

## 2021-05-09 DIAGNOSIS — D689 Coagulation defect, unspecified: Secondary | ICD-10-CM | POA: Diagnosis not present

## 2021-05-09 DIAGNOSIS — M79676 Pain in unspecified toe(s): Secondary | ICD-10-CM | POA: Diagnosis not present

## 2021-05-09 DIAGNOSIS — B351 Tinea unguium: Secondary | ICD-10-CM | POA: Diagnosis not present

## 2021-05-09 DIAGNOSIS — M7752 Other enthesopathy of left foot: Secondary | ICD-10-CM

## 2021-05-09 DIAGNOSIS — D2372 Other benign neoplasm of skin of left lower limb, including hip: Secondary | ICD-10-CM

## 2021-05-09 MED ORDER — DEXAMETHASONE SODIUM PHOSPHATE 120 MG/30ML IJ SOLN
2.0000 mg | Freq: Once | INTRAMUSCULAR | Status: AC
  Administered 2021-05-09: 2 mg via INTRA_ARTICULAR

## 2021-05-10 NOTE — Progress Notes (Signed)
He presents today with his wife chief complaint of painfully elongated toenails bilaterally.  Objective: Vital signs are stable he is alert and oriented x3.  Pulses are palpable.  No open lesions or wounds are noted.  Assessment: Pain in limb secondary to onychomycosis.  Plan: Debridement of toenails 1 through 5 bilateral

## 2021-05-14 ENCOUNTER — Ambulatory Visit: Payer: Medicare Other | Admitting: Podiatry

## 2021-05-20 NOTE — Patient Instructions (Addendum)
  Blood work was ordered.     Medications changes include :   tylenol #3 for neck pain when heat, ice, tylenol and topical arthritis medications are not effective   Your prescription(s) have been submitted to your pharmacy. Please take as directed and contact our office if you believe you are having problem(s) with the medication(s).    Please followup in 55month

## 2021-05-20 NOTE — Progress Notes (Signed)
Subjective:    Patient ID: Brett Franklin, male    DOB: 1925/11/17, 87 y.o.   MRN: [id number]  HPI The patient is here for follow up of their chronic medical problems, including afib, htn, hichol, DM, hypothyroid, anxiety  He has intermittent right upper back and neck pain.  It lasts a few days.  He has tried Tylenol and it did not do much.  He does not have it when he lays down or walks - only when he is sitting.    Medications and allergies reviewed with patient and updated if appropriate.  Patient Active Problem List   Diagnosis Date Noted   Colicky RUQ abdominal pain 11/20/2020   Ingrown nail of great toe of right foot 03/16/2020   Chronic sphenoidal sinusitis 07/06/2019   Presbycusis of both ears 06/08/2019   Acquired hypothyroidism 05/19/2019   Lightheadedness 05/06/2019   Atrial flutter (HCC)    Paroxysmal atrial fibrillation (HCC)    ETD (eustachian tube dysfunction) 02/17/2019   Poor balance 10/08/2018   Right foot pain 01/13/2017   Hyperlipidemia 07/21/2011   OTHER DYSPHAGIA 03/04/2010   History of stroke 12/31/2009   LOW BACK PAIN SYNDROME 11/12/2009   CERVICAL RADICULOPATHY, LEFT 05/11/2008   NEPHROLITHIASIS, HX OF 05/11/2008   Diabetes mellitus with neurological manifestations, controlled ([place]) 11/16/2007   Anxiety 07/08/2007   Cough 03/10/2007   Melanoma of skin ([place]) 01/21/2007   Essential hypertension 01/21/2007   IBS 01/21/2007   BPH (benign prostatic hyperplasia) 01/21/2007   STENOSIS, CERVICAL SPINAL 01/21/2007   History of adenomatous polyp of colon 01/21/2007    Current Outpatient Medications on File Prior to Visit  Medication Sig Dispense Refill   apixaban (ELIQUIS) 5 MG TABS tablet TAKE 1 TABLET(5 MG) BY MOUTH TWICE DAILY 180 tablet 3   blood glucose meter kit and supplies KIT Dispense based on patient and insurance preference. Use to text blood sugars daily. 1 each 0   diltiazem (CARDIZEM CD) 120 MG 24 hr capsule TAKE 1 CAPSULE(120 MG) BY MOUTH  DAILY 90 capsule 1   dronedarone (MULTAQ) 400 MG tablet TAKE 1 TABLET(400 MG) BY MOUTH TWICE DAILY WITH A MEAL 60 tablet 5   glucose blood test strip Use to test blood sugars once daily. Dx code-E11.49 100 each 3   JANUMET 50-500 MG tablet TAKE 1 TABLET BY MOUTH DAILY 90 tablet 1   Lancets (FREESTYLE) lancets Use to check blood sugars daily. 100 each 1   levothyroxine (SYNTHROID) 25 MCG tablet Take 25 mcg by mouth daily.     LORazepam (ATIVAN) 0.5 MG tablet Take 1 tablet (0.5 mg total) by mouth every 12 (twelve) hours as needed for anxiety. 20 tablet 1   metoprolol tartrate (LOPRESSOR) 25 MG tablet Take 25 mg by mouth 2 (two) times daily.     mometasone (NASONEX) 50 MCG/ACT nasal spray SMARTSIG:2 Spray(s) Both Nares Every Night     mupirocin ointment (BACTROBAN) 2 % Apply to wound after soaking BID 30 g 1   polyethylene glycol (MIRALAX / GLYCOLAX) 17 g packet Take 17 g by mouth daily.     pravastatin (PRAVACHOL) 20 MG tablet TAKE 1 TABLET(20 MG) BY MOUTH DAILY 90 tablet 1   No current facility-administered medications on file prior to visit.    Past Medical History:  Diagnosis Date   Adenomatous polyp of colon 11/1996   Dr Fuller Plan   Anxiety    BPH (benign prostatic hyperplasia)    Cervical spinal stenosis    CVA (  cerebral vascular accident) ([place])    Diabetes mellitus, type 2 ([place])    GERD (gastroesophageal reflux disease)    Hearing loss    Hemorrhoids    Hypertension    IBS (irritable bowel syndrome)    Melanoma ([place])    Nephrolithiasis     X 4    Past Surgical History:  Procedure Laterality Date   CARDIOVERSION N/A 04/13/2019   Procedure: CARDIOVERSION;  Surgeon: Buford Dresser, MD;  Location: [facility] ENDOSCOPY;  Service: Cardiovascular;  Laterality: N/A;   CATARACT EXTRACTION Left    CHOLECYSTECTOMY     COLONOSCOPY W/ POLYPECTOMY     Dr.Stark   CYSTOSCOPY     Dr.Kimbrough     Social History   Socioeconomic History   Marital status: Married    Spouse name: Not on  file   Number of children: 2   Years of education: Not on file   Highest education level: Not on file  Occupational History   Occupation: retired    Fish farm manager: RETIRED  Tobacco Use   Smoking status: Never   Smokeless tobacco: Never  Vaping Use   Vaping Use: Never used  Substance and Sexual Activity   Alcohol use: No   Drug use: No   Sexual activity: Not Currently  Other Topics Concern   Not on file  Social History Narrative   Not on file   Social Determinants of Health   Financial Resource Strain: Not on file  Food Insecurity: Not on file  Transportation Needs: Not on file  Physical Activity: Not on file  Stress: Not on file  Social Connections: Not on file    Family History  Problem Relation Age of Onset   Alcohol abuse Father    Heart disease Father    Heart attack Maternal Grandfather 41   Diabetes type I Other        grandson   Cancer Neg Hx    Stroke Neg Hx     Review of Systems  Constitutional:  Negative for fever.  Respiratory:  Negative for cough, shortness of breath and wheezing.   Cardiovascular:  Negative for chest pain, palpitations and leg swelling.  Musculoskeletal:  Positive for neck pain (intermittent). Negative for neck stiffness.  Neurological:  Negative for dizziness, light-headedness and headaches.      Objective:   Vitals:   05/21/21 0751  BP: 120/78  Pulse: 67  Temp: 98.2 F (36.8 C)  SpO2: 99%   BP Readings from Last 3 Encounters:  05/21/21 120/78  02/28/21 138/68  02/18/21 120/74   Wt Readings from Last 3 Encounters:  05/21/21 160 lb 6.4 oz (72.8 kg)  02/28/21 163 lb (73.9 kg)  02/18/21 159 lb 6.4 oz (72.3 kg)   Body mass index is 23.02 kg/m.   Physical Exam    Constitutional: Appears well-developed and well-nourished. No distress.  HENT:  Head: Normocephalic and atraumatic.  Neck: Neck supple. No tracheal deviation present. No thyromegaly present.  No cervical lymphadenopathy Cardiovascular: Normal rate, regular  rhythm and normal heart sounds.   No murmur heard. No carotid bruit .  No edema Pulmonary/Chest: Effort normal and breath sounds normal. No respiratory distress. No has no wheezes. No rales.  Msk:  no c spine tenderness or muscle tenderness in neck or upper back Skin: Skin is warm and dry. Not diaphoretic.  Psychiatric: Normal mood and affect. Behavior is normal.      Assessment & Plan:    See Problem List for Assessment and Plan of chronic  medical problems.    This visit occurred during the SARS-CoV-2 public health emergency.  Safety protocols were in place, including screening questions prior to the visit, additional usage of staff PPE, and extensive cleaning of exam room while observing appropriate contact time as indicated for disinfecting solutions.

## 2021-05-21 ENCOUNTER — Encounter: Payer: Self-pay | Admitting: Internal Medicine

## 2021-05-21 ENCOUNTER — Ambulatory Visit (INDEPENDENT_AMBULATORY_CARE_PROVIDER_SITE_OTHER): Payer: Medicare Other | Admitting: Internal Medicine

## 2021-05-21 ENCOUNTER — Other Ambulatory Visit: Payer: Self-pay

## 2021-05-21 VITALS — BP 120/78 | HR 67 | Temp 98.2°F | Ht 70.0 in | Wt 160.4 lb

## 2021-05-21 DIAGNOSIS — I480 Paroxysmal atrial fibrillation: Secondary | ICD-10-CM | POA: Diagnosis not present

## 2021-05-21 DIAGNOSIS — E7849 Other hyperlipidemia: Secondary | ICD-10-CM

## 2021-05-21 DIAGNOSIS — E1149 Type 2 diabetes mellitus with other diabetic neurological complication: Secondary | ICD-10-CM | POA: Diagnosis not present

## 2021-05-21 DIAGNOSIS — M542 Cervicalgia: Secondary | ICD-10-CM

## 2021-05-21 DIAGNOSIS — E039 Hypothyroidism, unspecified: Secondary | ICD-10-CM | POA: Diagnosis not present

## 2021-05-21 DIAGNOSIS — I10 Essential (primary) hypertension: Secondary | ICD-10-CM

## 2021-05-21 DIAGNOSIS — F419 Anxiety disorder, unspecified: Secondary | ICD-10-CM

## 2021-05-21 MED ORDER — ACETAMINOPHEN-CODEINE #3 300-30 MG PO TABS: 1.0000 | ORAL_TABLET | Freq: Three times a day (TID) | ORAL | 0 refills | Status: DC | PRN

## 2021-05-21 NOTE — Assessment & Plan Note (Signed)
Chronic BP well controlled Continue cardizem 120 mg qd, metoprolol '25mg'$  bid cmp

## 2021-05-21 NOTE — Assessment & Plan Note (Signed)
Chronic  Clinically euthyroid Currently taking levothyroxine 25 mcg Check tsh  Titrate med dose if needed

## 2021-05-21 NOTE — Assessment & Plan Note (Signed)
Chronic Controlled, stable Continue ativan 0.5 mg daily prn - does not take frequently

## 2021-05-21 NOTE — Assessment & Plan Note (Signed)
Chronic Following with cardiology On multaq, eliquis 5 mg bid Cmp, cbc

## 2021-05-21 NOTE — Assessment & Plan Note (Addendum)
Chronic, intermittent ? Related to OA, or msk Advised trying topical arthritis medications, heat, ice and regular tylenol Will give a small amount of tylenol #3 per his request - he will take when pain is severe and above is not effective Discussed possible side effects of Tylenol #3

## 2021-05-21 NOTE — Assessment & Plan Note (Signed)
Chronic Lab Results  Component Value Date   HGBA1C 6.3 11/20/2020   Controlled Continue janumet 50-500 mg qd Check a1c today

## 2021-05-21 NOTE — Assessment & Plan Note (Signed)
Chronic Lipids well controlled Continue pravastatin 20 mg qd Regular exercise and healthy diet encouraged

## 2021-06-13 LAB — HM DIABETES EYE EXAM

## 2021-06-16 ENCOUNTER — Other Ambulatory Visit: Payer: Self-pay | Admitting: Internal Medicine

## 2021-06-18 ENCOUNTER — Other Ambulatory Visit: Payer: Self-pay | Admitting: Internal Medicine

## 2021-07-18 ENCOUNTER — Other Ambulatory Visit: Payer: Self-pay | Admitting: Internal Medicine

## 2021-07-21 ENCOUNTER — Other Ambulatory Visit: Payer: Self-pay | Admitting: Cardiology

## 2021-07-22 ENCOUNTER — Other Ambulatory Visit: Payer: Self-pay | Admitting: Internal Medicine

## 2021-07-24 ENCOUNTER — Telehealth: Payer: Self-pay | Admitting: Internal Medicine

## 2021-07-24 NOTE — Telephone Encounter (Signed)
Patient's spouse called stating patient's levothyroxine (SYNTHROID) 25 MCG tablet medication was changed from 50mg  to 25mg   Patient is requesting a call back to discuss change of mg

## 2021-07-25 NOTE — Telephone Encounter (Signed)
After looking through this chart ----      So this is what happened - his last tsh in Feb was underactive and I advised him to increase the dose to 50 mcg after the blood work was done. I do not think he did this.   When he saw his foot doctor they took off the 50 mcg dose and put the 25 mcg dose back on his chart.    He ws supposed to get blood work done in august when he was here but did not.  So at this point the easiest thing to do is to have him come to the lab and have blood work at this time to see where he is at and we can change if needed.   Blood work is still in from august.

## 2021-07-25 NOTE — Telephone Encounter (Signed)
Message left for patient to return call to clinic to discuss.  If he calls back okay to give him Dr. Eilleen Kempf response.  He needs to also come back for lab recheck so please schedule.

## 2021-07-26 ENCOUNTER — Other Ambulatory Visit: Payer: Self-pay

## 2021-07-26 ENCOUNTER — Other Ambulatory Visit (INDEPENDENT_AMBULATORY_CARE_PROVIDER_SITE_OTHER): Payer: Medicare Other

## 2021-07-26 DIAGNOSIS — E1149 Type 2 diabetes mellitus with other diabetic neurological complication: Secondary | ICD-10-CM

## 2021-07-26 DIAGNOSIS — E039 Hypothyroidism, unspecified: Secondary | ICD-10-CM

## 2021-07-26 DIAGNOSIS — I480 Paroxysmal atrial fibrillation: Secondary | ICD-10-CM | POA: Diagnosis not present

## 2021-07-26 DIAGNOSIS — I10 Essential (primary) hypertension: Secondary | ICD-10-CM | POA: Diagnosis not present

## 2021-07-26 LAB — COMPREHENSIVE METABOLIC PANEL
ALT: 10 U/L (ref 0–53)
AST: 15 U/L (ref 0–37)
Albumin: 4.3 g/dL (ref 3.5–5.2)
Alkaline Phosphatase: 46 U/L (ref 39–117)
BUN: 25 mg/dL — ABNORMAL HIGH (ref 6–23)
CO2: 26 mEq/L (ref 19–32)
Calcium: 9.3 mg/dL (ref 8.4–10.5)
Chloride: 104 mEq/L (ref 96–112)
Creatinine, Ser: 1.19 mg/dL (ref 0.40–1.50)
GFR: 52.6 mL/min — ABNORMAL LOW (ref >60.00)
Glucose, Bld: 121 mg/dL — ABNORMAL HIGH (ref 70–99)
Potassium: 4.2 mEq/L (ref 3.5–5.1)
Sodium: 139 mEq/L (ref 135–145)
Total Bilirubin: 0.8 mg/dL (ref 0.2–1.2)
Total Protein: 7.1 g/dL (ref 6.0–8.3)

## 2021-07-26 LAB — TSH: TSH: 4.38 u[IU]/mL (ref 0.35–5.50)

## 2021-07-26 LAB — HEMOGLOBIN A1C: Hgb A1c MFr Bld: 6.2 % (ref 4.6–6.5)

## 2021-07-29 ENCOUNTER — Telehealth: Payer: Self-pay | Admitting: Internal Medicine

## 2021-07-29 NOTE — Telephone Encounter (Signed)
Patient's spouse Brett Franklin states she is returning call about labs  Advised spouse needs to come back for a lab recheck  Offered spouse appt for patient, spouse declined  Spouse is requesting a call back

## 2021-07-29 NOTE — Telephone Encounter (Signed)
Spoke with patient's wife today. 

## 2021-07-30 ENCOUNTER — Telehealth: Payer: Self-pay | Admitting: Internal Medicine

## 2021-07-30 MED ORDER — LEVOTHYROXINE SODIUM 50 MCG PO TABS: 50.0000 ug | ORAL_TABLET | Freq: Every day | ORAL | 3 refills | Status: DC

## 2021-07-30 NOTE — Telephone Encounter (Signed)
We can go up to the 50 mcg of the thyroid medication - I did increase this earlier this year to 50 mcg and the dose was changed back down by someone else.     We will recheck his thyroid function at his next visit.

## 2021-07-30 NOTE — Telephone Encounter (Signed)
Spoke with patient's wife today. 

## 2021-08-15 ENCOUNTER — Ambulatory Visit (INDEPENDENT_AMBULATORY_CARE_PROVIDER_SITE_OTHER): Payer: Medicare Other | Admitting: Podiatry

## 2021-08-15 ENCOUNTER — Encounter: Payer: Self-pay | Admitting: Podiatry

## 2021-08-15 ENCOUNTER — Other Ambulatory Visit: Payer: Self-pay

## 2021-08-15 DIAGNOSIS — B351 Tinea unguium: Secondary | ICD-10-CM | POA: Diagnosis not present

## 2021-08-15 DIAGNOSIS — M79676 Pain in unspecified toe(s): Secondary | ICD-10-CM | POA: Diagnosis not present

## 2021-08-15 DIAGNOSIS — D2372 Other benign neoplasm of skin of left lower limb, including hip: Secondary | ICD-10-CM | POA: Diagnosis not present

## 2021-08-15 DIAGNOSIS — D2371 Other benign neoplasm of skin of right lower limb, including hip: Secondary | ICD-10-CM

## 2021-08-15 DIAGNOSIS — E1142 Type 2 diabetes mellitus with diabetic polyneuropathy: Secondary | ICD-10-CM | POA: Diagnosis not present

## 2021-08-15 DIAGNOSIS — M7752 Other enthesopathy of left foot: Secondary | ICD-10-CM

## 2021-08-15 DIAGNOSIS — D689 Coagulation defect, unspecified: Secondary | ICD-10-CM | POA: Diagnosis not present

## 2021-08-15 MED ORDER — DEXAMETHASONE SODIUM PHOSPHATE 120 MG/30ML IJ SOLN
2.0000 mg | Freq: Once | INTRAMUSCULAR | Status: AC
  Administered 2021-08-15: 10:00:00 2 mg via INTRA_ARTICULAR

## 2021-08-18 NOTE — Progress Notes (Signed)
He presents today chief complaint of painful elongated toenails and calluses bilateral.  Objective: Pulses are palpable toenails are long thick yellow dystrophic with mycotic multiple reactive hyper keratomas plantar aspect of bilateral foot no change in his neurovascular status.  Assessment: Pain limb secondary to diabetic peripheral neuropathy and pain in limb secondary to onychomycosis and benign skin lesions.  Plan: Debridement of benign skin lesions debridement of toenails 1 through 5 bilaterally.  Follow-up with [place] 3 months.

## 2021-08-20 NOTE — Progress Notes (Signed)
Subjective:    Patient ID: Brett Franklin, male    DOB: 1925/11/17, 87 y.o.   MRN: [id number]  This visit occurred during the SARS-CoV-2 public health emergency.  Safety protocols were in place, including screening questions prior to the visit, additional usage of staff PPE, and extensive cleaning of exam room while observing appropriate contact time as indicated for disinfecting solutions.     HPI The patient is here for follow up of their chronic medical problems, including htn, DM, afib, hld, hypothyroid, anxiety  He is taking all of his medications as prescribed.   He is exercising regularly.   He is walking.    Medications and allergies reviewed with patient and updated if appropriate.  Patient Active Problem List   Diagnosis Date Noted   Neck pain on right side 05/21/2021   Ingrown nail of great toe of right foot 03/16/2020   Chronic sphenoidal sinusitis 07/06/2019   Presbycusis of both ears 06/08/2019   Acquired hypothyroidism 05/19/2019   Atrial flutter (HCC)    Paroxysmal atrial fibrillation (HCC)    ETD (eustachian tube dysfunction) 02/17/2019   Poor balance 10/08/2018   Right foot pain 01/13/2017   Hyperlipidemia 07/21/2011   OTHER DYSPHAGIA 03/04/2010   History of stroke 12/31/2009   LOW BACK PAIN SYNDROME 11/12/2009   CERVICAL RADICULOPATHY, LEFT 05/11/2008   NEPHROLITHIASIS, HX OF 05/11/2008   Diabetes mellitus with neurological manifestations, controlled ([place]) 11/16/2007   Anxiety 07/08/2007   Cough 03/10/2007   Melanoma of skin ([place]) 01/21/2007   Essential hypertension 01/21/2007   IBS 01/21/2007   BPH (benign prostatic hyperplasia) 01/21/2007   STENOSIS, CERVICAL SPINAL 01/21/2007   History of adenomatous polyp of colon 01/21/2007    Current Outpatient Medications on File Prior to Visit  Medication Sig Dispense Refill   acetaminophen-codeine (TYLENOL #3) 300-30 MG tablet Take 1 tablet by mouth every 8 (eight) hours as needed for moderate pain.  15 tablet 0   apixaban (ELIQUIS) 5 MG TABS tablet TAKE 1 TABLET(5 MG) BY MOUTH TWICE DAILY 180 tablet 3   blood glucose meter kit and supplies KIT Dispense based on patient and insurance preference. Use to text blood sugars daily. 1 each 0   diltiazem (CARDIZEM CD) 120 MG 24 hr capsule TAKE 1 CAPSULE(120 MG) BY MOUTH DAILY 90 capsule 1   glucose blood test strip Use to test blood sugars once daily. Dx code-E11.49 100 each 3   JANUMET 50-500 MG tablet TAKE 1 TABLET BY MOUTH DAILY 90 tablet 1   Lancets (FREESTYLE) lancets Use to check blood sugars daily. 100 each 1   levothyroxine (SYNTHROID) 50 MCG tablet Take 1 tablet (50 mcg total) by mouth daily. 90 tablet 3   LORazepam (ATIVAN) 0.5 MG tablet Take 1 tablet (0.5 mg total) by mouth every 12 (twelve) hours as needed for anxiety. 20 tablet 1   metoprolol tartrate (LOPRESSOR) 25 MG tablet Take 25 mg by mouth 2 (two) times daily.     mometasone (NASONEX) 50 MCG/ACT nasal spray SMARTSIG:2 Spray(s) Both Nares Every Night     MULTAQ 400 MG tablet TAKE 1 TABLET(400 MG) BY MOUTH TWICE DAILY WITH A MEAL 60 tablet 5   mupirocin ointment (BACTROBAN) 2 % Apply to wound after soaking BID 30 g 1   polyethylene glycol (MIRALAX / GLYCOLAX) 17 g packet Take 17 g by mouth daily.     pravastatin (PRAVACHOL) 20 MG tablet TAKE 1 TABLET(20 MG) BY MOUTH DAILY 90 tablet 1  No current facility-administered medications on file prior to visit.    Past Medical History:  Diagnosis Date   Adenomatous polyp of colon 11/1996   Dr Fuller Plan   Anxiety    BPH (benign prostatic hyperplasia)    Cervical spinal stenosis    CVA (cerebral vascular accident) ([place])    Diabetes mellitus, type 2 (HCC)    GERD (gastroesophageal reflux disease)    Hearing loss    Hemorrhoids    Hypertension    IBS (irritable bowel syndrome)    Melanoma ([place])    Nephrolithiasis     X 4    Past Surgical History:  Procedure Laterality Date   CARDIOVERSION N/A 04/13/2019   Procedure:  CARDIOVERSION;  Surgeon: Buford Dresser, MD;  Location: [facility] ENDOSCOPY;  Service: Cardiovascular;  Laterality: N/A;   CATARACT EXTRACTION Left    CHOLECYSTECTOMY     COLONOSCOPY W/ POLYPECTOMY     Dr.Stark   CYSTOSCOPY     Dr.Kimbrough     Social History   Socioeconomic History   Marital status: Married    Spouse name: Not on file   Number of children: 2   Years of education: Not on file   Highest education level: Not on file  Occupational History   Occupation: retired    Fish farm manager: RETIRED  Tobacco Use   Smoking status: Never   Smokeless tobacco: Never  Vaping Use   Vaping Use: Never used  Substance and Sexual Activity   Alcohol use: No   Drug use: No   Sexual activity: Not Currently  Other Topics Concern   Not on file  Social History Narrative   Not on file   Social Determinants of Health   Financial Resource Strain: Not on file  Food Insecurity: Not on file  Transportation Needs: Not on file  Physical Activity: Not on file  Stress: Not on file  Social Connections: Not on file    Family History  Problem Relation Age of Onset   Alcohol abuse Father    Heart disease Father    Heart attack Maternal Grandfather 68   Diabetes type I Other        grandson   Cancer Neg Hx    Stroke Neg Hx     Review of Systems  Constitutional:  Negative for fever.  Respiratory:  Negative for cough, shortness of breath and wheezing.   Cardiovascular:  Negative for chest pain, palpitations and leg swelling.  Neurological:  Negative for light-headedness and headaches.      Objective:   Vitals:   08/21/21 0754  BP: 120/78  Pulse: 60  Temp: 98.1 F (36.7 C)  SpO2: 99%   BP Readings from Last 3 Encounters:  08/21/21 120/78  05/21/21 120/78  02/28/21 138/68   Wt Readings from Last 3 Encounters:  08/21/21 161 lb (73 kg)  05/21/21 160 lb 6.4 oz (72.8 kg)  02/28/21 163 lb (73.9 kg)   Body mass index is 23.1 kg/m.   Physical Exam    Constitutional: Appears  well-developed and well-nourished. No distress.  HENT:  Head: Normocephalic and atraumatic.  Neck: Neck supple. No tracheal deviation present. No thyromegaly present.  No cervical lymphadenopathy Cardiovascular: Normal rate, regular rhythm and normal heart sounds.   No murmur heard. No carotid bruit .  No edema Pulmonary/Chest: Effort normal and breath sounds normal. No respiratory distress. No has no wheezes. No rales.  Skin: Skin is warm and dry. Not diaphoretic.  Psychiatric: Normal mood and affect. Behavior is normal.  Assessment & Plan:    See Problem List for Assessment and Plan of chronic medical problems.

## 2021-08-20 NOTE — Patient Instructions (Addendum)
    Flu immunization administered today.     Medications changes include :   None   Your prescription(s) have been submitted to your pharmacy. Please take as directed and contact our office if you believe you are having problem(s) with the medication(s).     Please followup in 3 months

## 2021-08-21 ENCOUNTER — Other Ambulatory Visit: Payer: Self-pay

## 2021-08-21 ENCOUNTER — Encounter: Payer: Self-pay | Admitting: Internal Medicine

## 2021-08-21 ENCOUNTER — Ambulatory Visit (INDEPENDENT_AMBULATORY_CARE_PROVIDER_SITE_OTHER): Payer: Medicare Other | Admitting: Internal Medicine

## 2021-08-21 VITALS — BP 120/78 | HR 60 | Temp 98.1°F | Ht 70.0 in | Wt 161.0 lb

## 2021-08-21 DIAGNOSIS — I480 Paroxysmal atrial fibrillation: Secondary | ICD-10-CM | POA: Diagnosis not present

## 2021-08-21 DIAGNOSIS — M542 Cervicalgia: Secondary | ICD-10-CM

## 2021-08-21 DIAGNOSIS — Z23 Encounter for immunization: Secondary | ICD-10-CM

## 2021-08-21 DIAGNOSIS — E1149 Type 2 diabetes mellitus with other diabetic neurological complication: Secondary | ICD-10-CM

## 2021-08-21 DIAGNOSIS — E039 Hypothyroidism, unspecified: Secondary | ICD-10-CM

## 2021-08-21 DIAGNOSIS — I10 Essential (primary) hypertension: Secondary | ICD-10-CM | POA: Diagnosis not present

## 2021-08-21 DIAGNOSIS — Z8673 Personal history of transient ischemic attack (TIA), and cerebral infarction without residual deficits: Secondary | ICD-10-CM

## 2021-08-21 DIAGNOSIS — E7849 Other hyperlipidemia: Secondary | ICD-10-CM

## 2021-08-21 DIAGNOSIS — F419 Anxiety disorder, unspecified: Secondary | ICD-10-CM

## 2021-08-21 MED ORDER — ACETAMINOPHEN-CODEINE #3 300-30 MG PO TABS
1.0000 | ORAL_TABLET | Freq: Three times a day (TID) | ORAL | 0 refills | Status: DC | PRN
Start: 2021-08-21 — End: 2021-11-21

## 2021-08-21 NOTE — Assessment & Plan Note (Signed)
Chronic  Clinically euthyroid Currently taking levothyroxine 50 mcg daily

## 2021-08-21 NOTE — Assessment & Plan Note (Signed)
Chronic Intermittent Continue tylenol #3 as needed  - does not use frequently

## 2021-08-21 NOTE — Assessment & Plan Note (Signed)
History of stroke On Eliquis 5 mg twice daily, pravastatin 20 mg daily Blood pressure well controlled Sugars well controlled

## 2021-08-21 NOTE — Assessment & Plan Note (Signed)
Chronic Regular exercise and healthy diet encouraged Continue pravastatin 20 mg daily 

## 2021-08-21 NOTE — Assessment & Plan Note (Addendum)
Chronic Blood pressure well controlled Continue Cardizem 120 mg daily, metoprolol 25 mg twice daily

## 2021-08-21 NOTE — Assessment & Plan Note (Signed)
Chronic Intermittent Controlled, Stable Continue Ativan 0.5 mg daily as needed, which she does not take frequently

## 2021-08-21 NOTE — Assessment & Plan Note (Signed)
Chronic Lab Results  Component Value Date   HGBA1C 6.2 07/26/2021   Well-controlled Continue Janumet 50-500 once daily

## 2021-08-21 NOTE — Assessment & Plan Note (Signed)
Chronic Following with cardiology Asymptomatic On Eliquis 5 mg twice daily and Multaq 400 mg twice daily

## 2021-08-21 NOTE — Addendum Note (Signed)
Addended by: Marcina Millard on: 08/21/2021 09:01 AM   Modules accepted: Orders

## 2021-08-26 ENCOUNTER — Other Ambulatory Visit: Payer: Self-pay | Admitting: Internal Medicine

## 2021-10-01 ENCOUNTER — Ambulatory Visit: Payer: Medicare Other | Admitting: Podiatry

## 2021-11-11 ENCOUNTER — Encounter: Payer: Self-pay | Admitting: Internal Medicine

## 2021-11-11 NOTE — Progress Notes (Signed)
Outside notes received. Information abstracted. Notes sent to scan.  

## 2021-11-14 ENCOUNTER — Ambulatory Visit (INDEPENDENT_AMBULATORY_CARE_PROVIDER_SITE_OTHER): Payer: Medicare Other | Admitting: Podiatry

## 2021-11-14 ENCOUNTER — Other Ambulatory Visit: Payer: Self-pay

## 2021-11-14 ENCOUNTER — Encounter: Payer: Self-pay | Admitting: Podiatry

## 2021-11-14 DIAGNOSIS — D689 Coagulation defect, unspecified: Secondary | ICD-10-CM | POA: Diagnosis not present

## 2021-11-14 DIAGNOSIS — E1142 Type 2 diabetes mellitus with diabetic polyneuropathy: Secondary | ICD-10-CM | POA: Diagnosis not present

## 2021-11-14 DIAGNOSIS — D2371 Other benign neoplasm of skin of right lower limb, including hip: Secondary | ICD-10-CM

## 2021-11-14 DIAGNOSIS — B351 Tinea unguium: Secondary | ICD-10-CM

## 2021-11-14 DIAGNOSIS — M7752 Other enthesopathy of left foot: Secondary | ICD-10-CM

## 2021-11-14 DIAGNOSIS — D2372 Other benign neoplasm of skin of left lower limb, including hip: Secondary | ICD-10-CM | POA: Diagnosis not present

## 2021-11-14 DIAGNOSIS — M79676 Pain in unspecified toe(s): Secondary | ICD-10-CM | POA: Diagnosis not present

## 2021-11-14 DIAGNOSIS — M7751 Other enthesopathy of right foot: Secondary | ICD-10-CM

## 2021-11-14 MED ORDER — DEXAMETHASONE SODIUM PHOSPHATE 120 MG/30ML IJ SOLN
4.0000 mg | Freq: Once | INTRAMUSCULAR | Status: AC
  Administered 2021-11-14: 4 mg via INTRA_ARTICULAR

## 2021-11-16 NOTE — Progress Notes (Signed)
He presents today for chief complaint of painful calluses subfifth bilaterally.  He is also complaining of painful elongated toenails.  Objective: Vital signs are stable he is alert and oriented x3.  Pulses are palpable.  He has painful palpable fluctuant lesions beneath the fifth metatarsal bilaterally left greater than right most likely consistent with bursitis.  He also has overlying reactive hyperkeratotic benign skin lesion.  Toenails are long thick yellow dystrophic sharply incurvated painful on palpation demonstrating no bacterial infection or cellulitic process.  Assessment: Pain in limb secondary to bursitis benign skin lesions and painful elongated toenails.  Plan: Discussed etiology pathology and surgical therapies injected dexamethasone subfifth met bilaterally.  Also debrided the benign skin lesions completely for him.  Placed padding.  Debrided toenails 1 through 5 bilateral.

## 2021-11-17 NOTE — Patient Instructions (Addendum)
° ° ° °  Medications changes include : None   Your prescription(s) have been sent to your pharmacy.      Return in about 3 months (around 02/18/2022) for follow up.

## 2021-11-17 NOTE — Progress Notes (Signed)
Subjective:    Patient ID: Brett Franklin, male    DOB: 1926/07/06, 88 y.o.   MRN: [id number]  This visit occurred during the SARS-CoV-2 public health emergency.  Safety protocols were in place, including screening questions prior to the visit, additional usage of staff PPE, and extensive cleaning of exam room while observing appropriate contact time as indicated for disinfecting solutions.     HPI The patient is here for follow up of their chronic medical problems, including htn, DM, afib, hld, hypothyroid, anxiety  he is walking regularly.    Last labs 10/22    Medications and allergies reviewed with patient and updated if appropriate.  Patient Active Problem List   Diagnosis Date Noted   Neck pain on right side 05/21/2021   Ingrown nail of great toe of right foot 03/16/2020   Chronic sphenoidal sinusitis 07/06/2019   Presbycusis of both ears 06/08/2019   Acquired hypothyroidism 05/19/2019   Atrial flutter (HCC)    Paroxysmal atrial fibrillation (HCC)    ETD (eustachian tube dysfunction) 02/17/2019   Poor balance 10/08/2018   Right foot pain 01/13/2017   Hyperlipidemia 07/21/2011   OTHER DYSPHAGIA 03/04/2010   History of stroke 12/31/2009   LOW BACK PAIN SYNDROME 11/12/2009   CERVICAL RADICULOPATHY, LEFT 05/11/2008   NEPHROLITHIASIS, HX OF 05/11/2008   Diabetes mellitus with neurological manifestations, controlled ([place]) 11/16/2007   Anxiety 07/08/2007   Cough 03/10/2007   Melanoma of skin ([place]) 01/21/2007   Essential hypertension 01/21/2007   IBS 01/21/2007   BPH (benign prostatic hyperplasia) 01/21/2007   STENOSIS, CERVICAL SPINAL 01/21/2007   History of adenomatous polyp of colon 01/21/2007    Current Outpatient Medications on File Prior to Visit  Medication Sig Dispense Refill   acetaminophen-codeine (TYLENOL #3) 300-30 MG tablet Take 1 tablet by mouth every 8 (eight) hours as needed for moderate pain. 15 tablet 0   blood glucose meter kit and supplies  KIT Dispense based on patient and insurance preference. Use to text blood sugars daily. 1 each 0   diltiazem (CARDIZEM CD) 120 MG 24 hr capsule TAKE 1 CAPSULE(120 MG) BY MOUTH DAILY 90 capsule 1   glucose blood test strip Use to test blood sugars once daily. Dx code-E11.49 100 each 3   JANUMET 50-500 MG tablet TAKE 1 TABLET BY MOUTH DAILY 90 tablet 1   Lancets (FREESTYLE) lancets Use to check blood sugars daily. 100 each 1   levothyroxine (SYNTHROID) 50 MCG tablet Take 1 tablet (50 mcg total) by mouth daily. 90 tablet 3   LORazepam (ATIVAN) 0.5 MG tablet TAKE 1 TABLET(0.5 MG) BY MOUTH EVERY 12 HOURS AS NEEDED FOR ANXIETY 20 tablet 0   metoprolol tartrate (LOPRESSOR) 25 MG tablet Take 25 mg by mouth 2 (two) times daily.     mometasone (NASONEX) 50 MCG/ACT nasal spray SMARTSIG:2 Spray(s) Both Nares Every Night     MULTAQ 400 MG tablet TAKE 1 TABLET(400 MG) BY MOUTH TWICE DAILY WITH A MEAL 60 tablet 5   mupirocin ointment (BACTROBAN) 2 % Apply to wound after soaking BID 30 g 1   polyethylene glycol (MIRALAX / GLYCOLAX) 17 g packet Take 17 g by mouth daily.     pravastatin (PRAVACHOL) 20 MG tablet TAKE 1 TABLET(20 MG) BY MOUTH DAILY 90 tablet 1   No current facility-administered medications on file prior to visit.    Past Medical History:  Diagnosis Date   Adenomatous polyp of colon 11/1996   Dr Fuller Plan  Anxiety    BPH (benign prostatic hyperplasia)    Cervical spinal stenosis    CVA (cerebral vascular accident) ([place])    Diabetes mellitus, type 2 (HCC)    GERD (gastroesophageal reflux disease)    Hearing loss    Hemorrhoids    Hypertension    IBS (irritable bowel syndrome)    Melanoma ([place])    Nephrolithiasis     X 4    Past Surgical History:  Procedure Laterality Date   CARDIOVERSION N/A 04/13/2019   Procedure: CARDIOVERSION;  Surgeon: Buford Dresser, MD;  Location: [facility] ENDOSCOPY;  Service: Cardiovascular;  Laterality: N/A;   CATARACT EXTRACTION Left    CHOLECYSTECTOMY      COLONOSCOPY W/ POLYPECTOMY     Dr.Stark   CYSTOSCOPY     Dr.Kimbrough     Social History   Socioeconomic History   Marital status: Married    Spouse name: Not on file   Number of children: 2   Years of education: Not on file   Highest education level: Not on file  Occupational History   Occupation: retired    Fish farm manager: RETIRED  Tobacco Use   Smoking status: Never   Smokeless tobacco: Never  Vaping Use   Vaping Use: Never used  Substance and Sexual Activity   Alcohol use: No   Drug use: No   Sexual activity: Not Currently  Other Topics Concern   Not on file  Social History Narrative   Not on file   Social Determinants of Health   Financial Resource Strain: Not on file  Food Insecurity: Not on file  Transportation Needs: Not on file  Physical Activity: Not on file  Stress: Not on file  Social Connections: Not on file    Family History  Problem Relation Age of Onset   Alcohol abuse Father    Heart disease Father    Heart attack Maternal Grandfather 68   Diabetes type I Other        grandson   Cancer Neg Hx    Stroke Neg Hx     Review of Systems  Constitutional:  Negative for fever.  Respiratory:  Negative for cough, shortness of breath and wheezing.   Cardiovascular:  Negative for chest pain, palpitations and leg swelling.  Neurological:  Negative for light-headedness and headaches.      Objective:   Vitals:   11/21/21 0758  BP: 122/78  Pulse: 61  Resp: 12  Temp: 98.2 F (36.8 C)   BP Readings from Last 3 Encounters:  11/21/21 122/78  08/21/21 120/78  05/21/21 120/78   Wt Readings from Last 3 Encounters:  11/21/21 162 lb (73.5 kg)  08/21/21 161 lb (73 kg)  05/21/21 160 lb 6.4 oz (72.8 kg)   Body mass index is 23.24 kg/m.   Physical Exam    Constitutional: Appears well-developed and well-nourished. No distress.  HENT:  Head: Normocephalic and atraumatic.  Neck: Neck supple. No tracheal deviation present. No thyromegaly present.  No  cervical lymphadenopathy Cardiovascular: Normal rate, regular rhythm and normal heart sounds.   No murmur heard. No carotid bruit .  No edema Pulmonary/Chest: Effort normal and breath sounds normal. No respiratory distress. No has no wheezes. No rales.  Skin: Skin is warm and dry. Not diaphoretic.  Psychiatric: Normal mood and affect. Behavior is normal.      Assessment & Plan:    See Problem List for Assessment and Plan of chronic medical problems.

## 2021-11-19 IMAGING — US US ABDOMEN COMPLETE
1 series · 13 of 25 positions shown · non-contrast
Comparison: Ultrasound 06/10/2010

CLINICAL DATA: Right upper quadrant pain

EXAM:
ABDOMEN ULTRASOUND COMPLETE

[Series 1: us abdomen complete · 0.23mm/px · 13 of 158 slices shown]
[im 1/158]
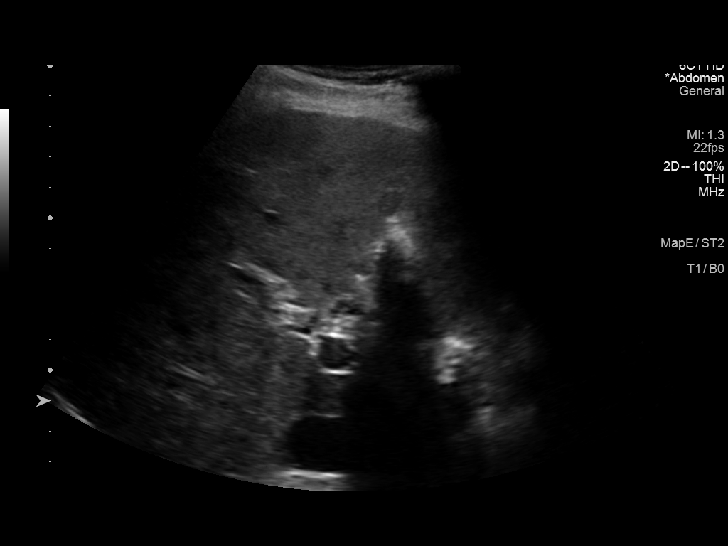
[im 14/158]
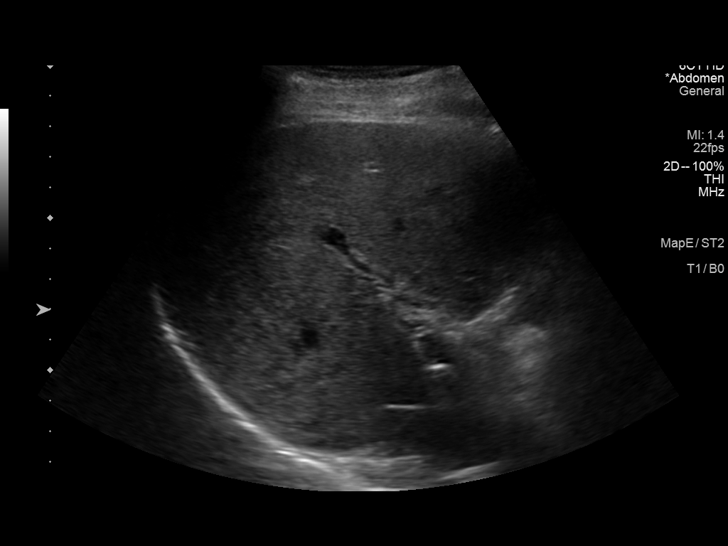
[im 27/158]
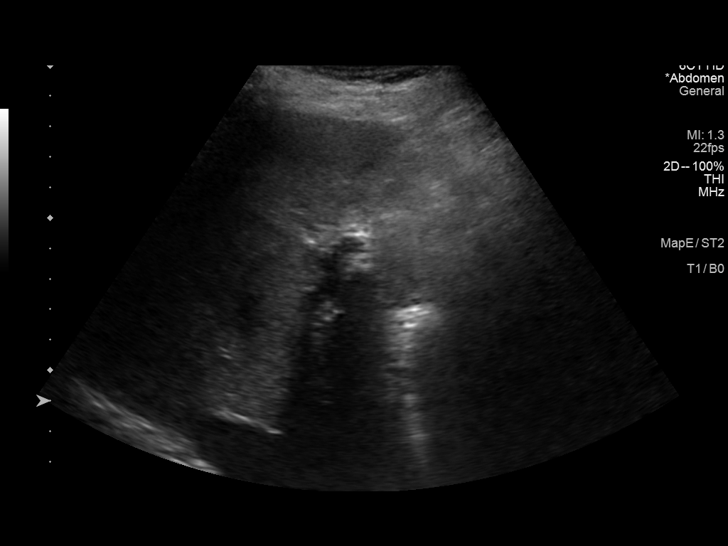
[im 40/158]
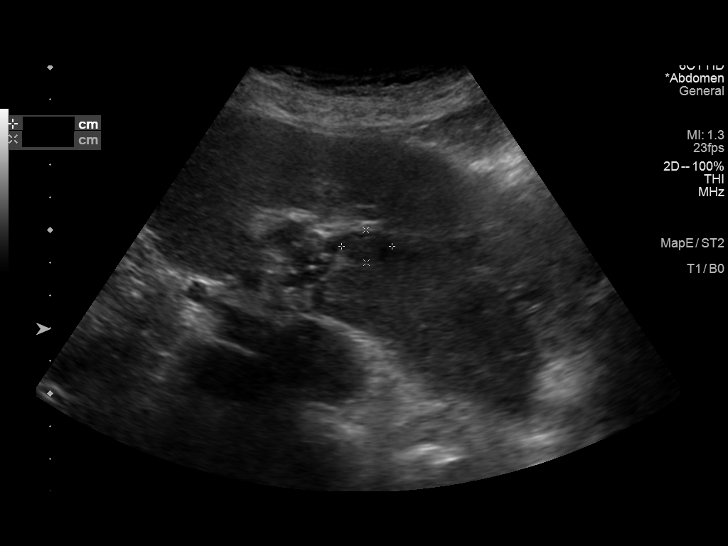
[im 53/158]
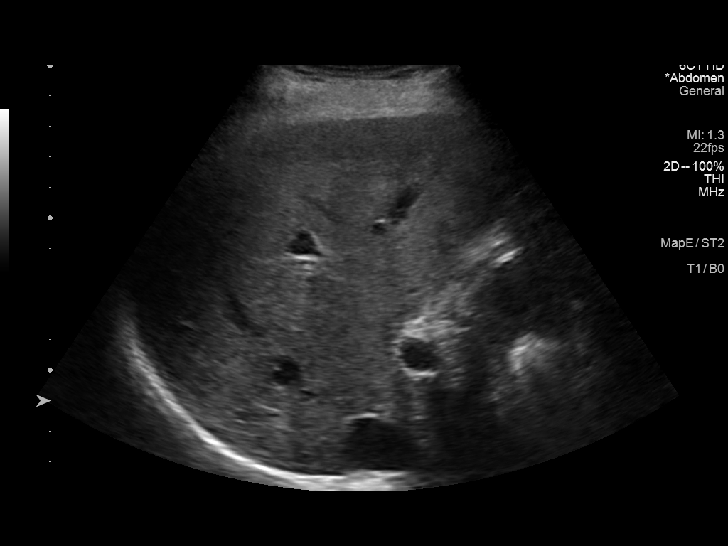
[im 66/158]
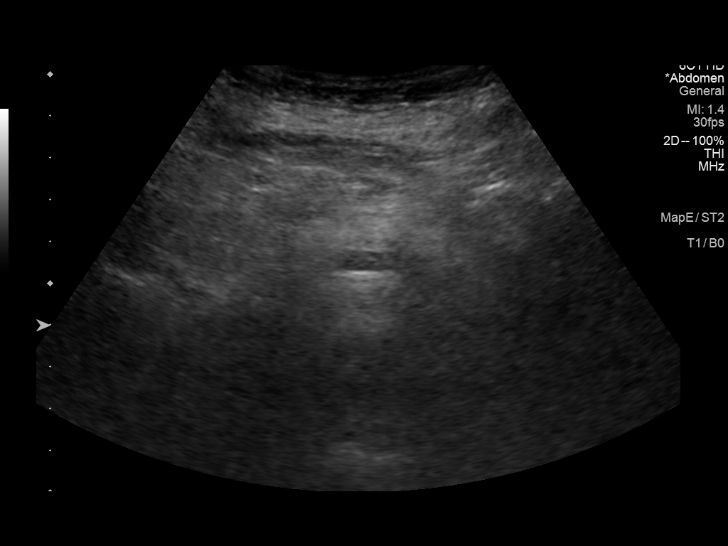
[im 79/158]
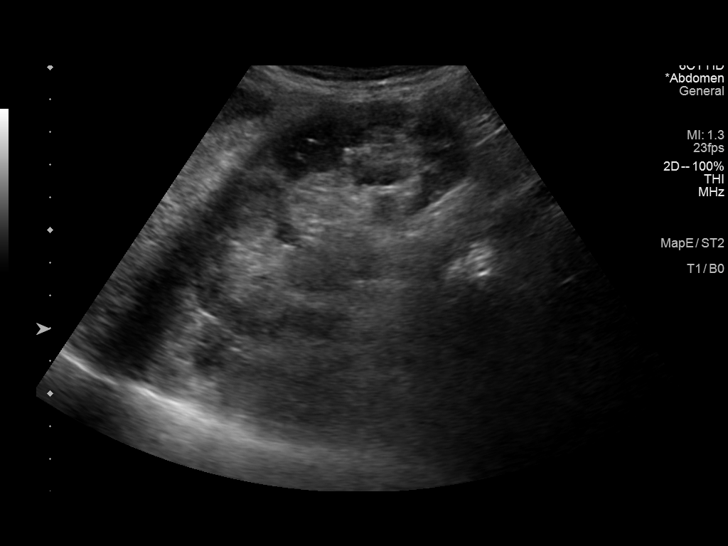
[im 92/158]
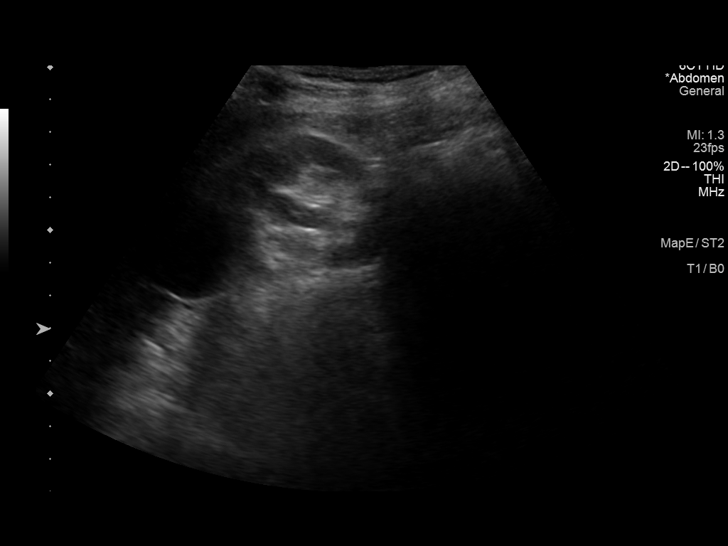
[im 105/158]
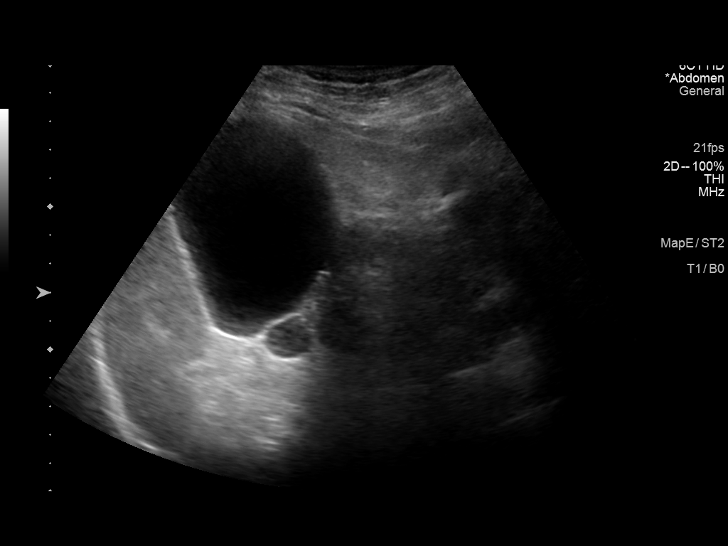
[im 118/158]
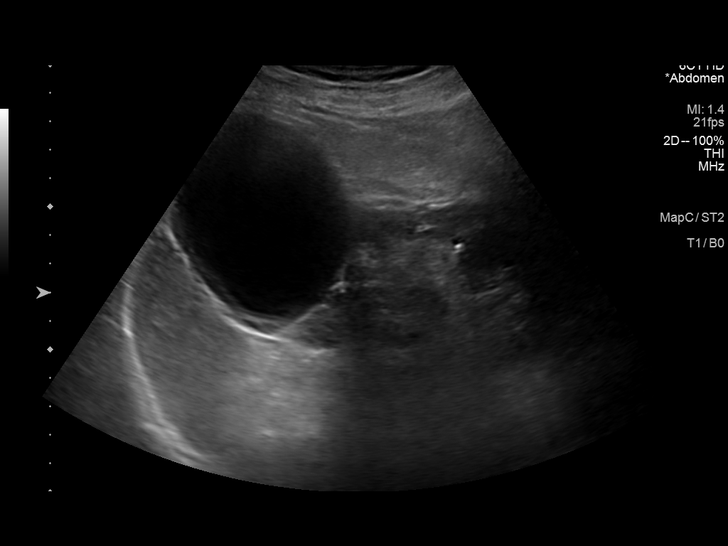
[im 131/158]
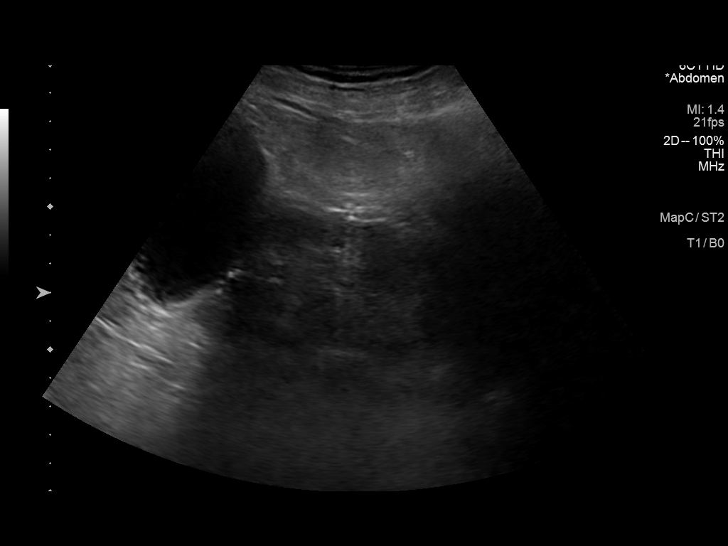
[im 144/158]
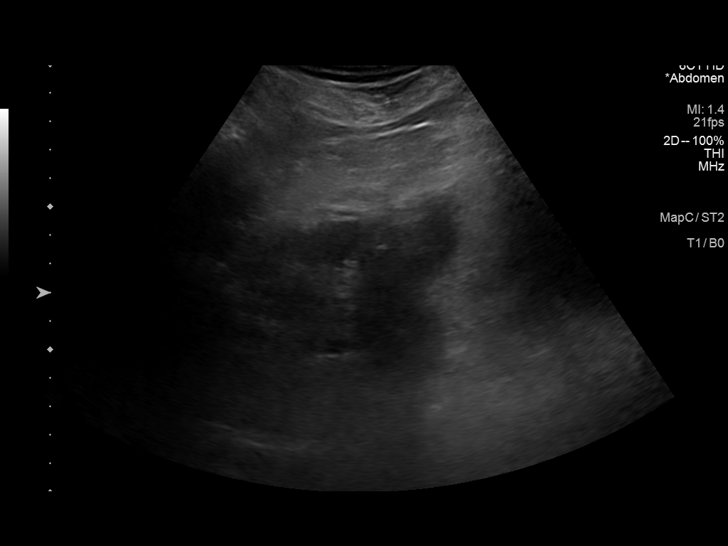
[im 158/158]
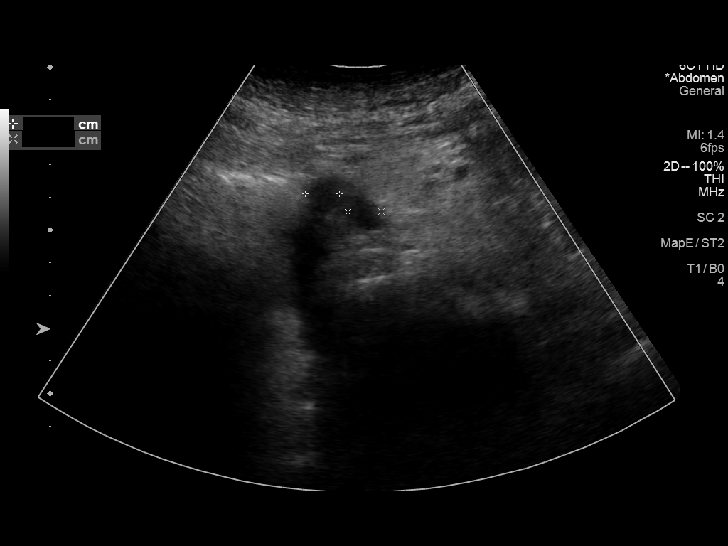

[13 of 25 positions shown; findings below may reference images not displayed]

FINDINGS: Gallbladder: Surgically absent

Common bile duct: Diameter: 5.8 mm

Liver: Heterogeneous echotexture. Small hypoechoic possible solid
nodule near the gallbladder fossa measuring 1.3 by 1 x 1.1 cm. Solid
appearing oval nodule in the left hepatic lobe measuring 1.6 x 1 x
1.5 cm. Mild intra hepatic biliary dilatation. Portal vein is patent
on color Doppler imaging with normal direction of blood flow towards
the liver.

IVC: No abnormality visualized.

Pancreas: Visualized portion unremarkable.

Spleen: Size and appearance within normal limits.

Right Kidney: Length: 10.3 cm. Echogenicity within normal limits. No
hydronephrosis. Multiple cysts. The largest is seen in the lower
pole and measures 3.5 x 2.9 x 3.1 cm.

Left Kidney: Length: 9.1 cm. Cortical echogenicity within normal
limits. No hydronephrosis. Multiple cysts in the left kidney, the
largest is seen in the upper pole and measures 7.7 x 6.1 by 8 cm.
Adjacent to this is a small solid appearing nodule measuring 1.5 by
1.5 x 1.7 cm. Complex appearing cysts within the lower pole
measuring 1.8 x 1.6 x 1.9 cm and 1.7 by 1.8 x 1.6 cm.

Abdominal aorta: No aneurysm visualized.

Other findings: None.
IMPRESSION: 1. Status post cholecystectomy. Minimal intra hepatic biliary
dilatation but with normal common bile duct diameter, findings
probably due to postsurgical change.
2. Heterogenous liver. Possible solid nodules within the liver,
consider MRI for further evaluation.
3. Possible small solid mass left kidney measuring 1.7 cm with
additional complex appearing cyst at the lower pole. This could also
be evaluated at MRI.

## 2021-11-20 ENCOUNTER — Other Ambulatory Visit: Payer: Self-pay | Admitting: Internal Medicine

## 2021-11-20 ENCOUNTER — Encounter: Payer: Self-pay | Admitting: Internal Medicine

## 2021-11-21 ENCOUNTER — Other Ambulatory Visit: Payer: Self-pay

## 2021-11-21 ENCOUNTER — Ambulatory Visit (INDEPENDENT_AMBULATORY_CARE_PROVIDER_SITE_OTHER): Payer: Medicare Other | Admitting: Internal Medicine

## 2021-11-21 VITALS — BP 122/78 | HR 61 | Temp 98.2°F | Resp 12 | Ht 70.0 in | Wt 162.0 lb

## 2021-11-21 DIAGNOSIS — E039 Hypothyroidism, unspecified: Secondary | ICD-10-CM

## 2021-11-21 DIAGNOSIS — I480 Paroxysmal atrial fibrillation: Secondary | ICD-10-CM | POA: Diagnosis not present

## 2021-11-21 DIAGNOSIS — I10 Essential (primary) hypertension: Secondary | ICD-10-CM

## 2021-11-21 DIAGNOSIS — E1149 Type 2 diabetes mellitus with other diabetic neurological complication: Secondary | ICD-10-CM | POA: Diagnosis not present

## 2021-11-21 DIAGNOSIS — F419 Anxiety disorder, unspecified: Secondary | ICD-10-CM

## 2021-11-21 DIAGNOSIS — E7849 Other hyperlipidemia: Secondary | ICD-10-CM

## 2021-11-21 DIAGNOSIS — M542 Cervicalgia: Secondary | ICD-10-CM

## 2021-11-21 MED ORDER — APIXABAN 5 MG PO TABS: ORAL_TABLET | ORAL | 3 refills | Status: DC

## 2021-11-21 MED ORDER — ACETAMINOPHEN-CODEINE #3 300-30 MG PO TABS: 1.0000 | ORAL_TABLET | Freq: Three times a day (TID) | ORAL | 0 refills | Status: DC | PRN

## 2021-11-21 MED ORDER — LORAZEPAM 0.5 MG PO TABS
ORAL_TABLET | ORAL | 0 refills | Status: DC
Start: 2021-11-21 — End: 2022-02-20

## 2021-11-21 NOTE — Assessment & Plan Note (Addendum)
Chronic Blood pressure well controlled Continue diltiazem 120 mg daily, metoprolol 25 mg twice daily

## 2021-11-21 NOTE — Assessment & Plan Note (Signed)
Chronic, intermittent Controlled, Stable Continue lorazepam 0.5 mg daily as needed which he takes infrequently

## 2021-11-21 NOTE — Assessment & Plan Note (Signed)
Chronic Intermittent Continue tylenol #3 as needed - takes infrequently

## 2021-11-21 NOTE — Assessment & Plan Note (Signed)
Chronic Asymptomatic Following with cardiology On Eliquis 5 mg twice daily, Multaq 400 mg twice daily, metoprolol 25 mg twice daily

## 2021-11-21 NOTE — Assessment & Plan Note (Signed)
Chronic  Clinically euthyroid Currently taking levothyroxine 50 mcg daily

## 2021-11-21 NOTE — Assessment & Plan Note (Signed)
Lab Results  Component Value Date   HGBA1C 6.2 07/26/2021   Well-controlled Continue Janumet 50-500 mg 1 tab daily

## 2021-11-21 NOTE — Assessment & Plan Note (Signed)
Chronic Regular exercise and healthy diet encouraged Lipids controlled Continue pravastatin 20 mg daily 

## 2021-11-26 NOTE — Telephone Encounter (Signed)
Refills were sent in  °

## 2021-12-03 ENCOUNTER — Other Ambulatory Visit: Payer: Self-pay | Admitting: Internal Medicine

## 2021-12-30 NOTE — Progress Notes (Signed)
? ? ?Subjective:  ? ? Patient ID: Brett Franklin, male    DOB: 30-Dec-1925, 88 y.o.   MRN: [id number] ? ?This visit occurred during the SARS-CoV-2 public health emergency.  Safety protocols were in place, including screening questions prior to the visit, additional usage of staff PPE, and extensive cleaning of exam room while observing appropriate contact time as indicated for disinfecting solutions. ? ? ? ?HPI ?Brett Franklin is here for  ?Chief Complaint  ?Patient presents with  ? Abdominal Pain  ?  Right sided abdominal tenderness (not noticeable today)  ? ? ? ?Soreness on right lower side that started 3/29 - he woke up about one week ago - he had a bad pain in his right side-just above his iliac crest.  He took a tylenol and it helped a little.  It hurt all day.  The next day it went away.  The day after it recurred but was less severe.  Has had the pain a couple of other times.  The pain was sharp.  It was not worse with sitting, getting dressed.  No N/T.  He has not had any pain today. ? ?He denies any changes in urination or bowels.  He denies any activity that may have caused the pain-there has not been anything new. ? ?He does have a history of kidney stones, but this did not feel like that. ? ? ?He is taking his medications daily as prescribed.  He denies any changes. ? ?Medications and allergies reviewed with patient and updated if appropriate. ? ?Current Outpatient Medications on File Prior to Visit  ?Medication Sig Dispense Refill  ? acetaminophen-codeine (TYLENOL #3) 300-30 MG tablet Take 1 tablet by mouth every 8 (eight) hours as needed for moderate pain. 15 tablet 0  ? apixaban (ELIQUIS) 5 MG TABS tablet TAKE 1 TABLET(5 MG) BY MOUTH TWICE DAILY 180 tablet 3  ? blood glucose meter kit and supplies KIT Dispense based on patient and insurance preference. Use to text blood sugars daily. 1 each 0  ? diltiazem (CARDIZEM CD) 120 MG 24 hr capsule TAKE 1 CAPSULE(120 MG) BY MOUTH DAILY 90 capsule 1  ? glucose blood test  strip Use to test blood sugars once daily. Dx code-E11.49 100 each 3  ? JANUMET 50-500 MG tablet TAKE 1 TABLET BY MOUTH DAILY 90 tablet 1  ? Lancets (FREESTYLE) lancets Use to check blood sugars daily. 100 each 1  ? levothyroxine (SYNTHROID) 50 MCG tablet Take 1 tablet (50 mcg total) by mouth daily. 90 tablet 3  ? LORazepam (ATIVAN) 0.5 MG tablet TAKE 1 TABLET(0.5 MG) BY MOUTH EVERY 12 HOURS AS NEEDED FOR ANXIETY 20 tablet 0  ? metoprolol tartrate (LOPRESSOR) 25 MG tablet Take 25 mg by mouth 2 (two) times daily.    ? mometasone (NASONEX) 50 MCG/ACT nasal spray SMARTSIG:2 Spray(s) Both Nares Every Night    ? MULTAQ 400 MG tablet TAKE 1 TABLET(400 MG) BY MOUTH TWICE DAILY WITH A MEAL 60 tablet 5  ? mupirocin ointment (BACTROBAN) 2 % Apply to wound after soaking BID 30 g 1  ? polyethylene glycol (MIRALAX / GLYCOLAX) 17 g packet Take 17 g by mouth daily.    ? pravastatin (PRAVACHOL) 20 MG tablet TAKE 1 TABLET(20 MG) BY MOUTH DAILY 90 tablet 1  ? tretinoin (RETIN-A) 0.05 % cream Apply 1 application. topically at bedtime.    ? VIAGRA 100 MG tablet     ? ?No current facility-administered medications on file prior to visit.  ? ? ?  Review of Systems  ?Constitutional:  Negative for chills and fever.  ?Respiratory:  Negative for cough, shortness of breath and wheezing.   ?Cardiovascular:  Negative for chest pain.  ?Gastrointestinal:  Negative for blood in stool, constipation, diarrhea and nausea.  ?Genitourinary:  Negative for difficulty urinating, dysuria, frequency and hematuria.  ?Neurological:  Negative for numbness.  ? ?   ?Objective:  ? ?Vitals:  ? 12/31/21 1411  ?BP: 136/76  ?Pulse: 78  ?Temp: 98 ?F (36.7 ?C)  ?SpO2: 99%  ? ?BP Readings from Last 3 Encounters:  ?12/31/21 136/76  ?11/21/21 122/78  ?08/21/21 120/78  ? ?Wt Readings from Last 3 Encounters:  ?12/31/21 161 lb (73 kg)  ?11/21/21 162 lb (73.5 kg)  ?08/21/21 161 lb (73 kg)  ? ?Body mass index is 23.1 kg/m?. ? ?  ?Physical Exam ?Constitutional:   ?   General: He  is not in acute distress. ?   Appearance: He is well-developed.  ?HENT:  ?   Head: Normocephalic.  ?Cardiovascular:  ?   Rate and Rhythm: Normal rate and regular rhythm.  ?Pulmonary:  ?   Effort: Pulmonary effort is normal. No respiratory distress.  ?   Breath sounds: No wheezing or rales.  ?Abdominal:  ?   General: Abdomen is flat.  ?   Tenderness: There is no abdominal tenderness. There is no right CVA tenderness, left CVA tenderness, guarding or rebound.  ?   Hernia: No hernia is present.  ?Musculoskeletal:  ?   Lumbar back: No deformity or tenderness.  ?Skin: ?   General: Skin is warm and dry.  ?   Findings: No rash.  ?Neurological:  ?   Mental Status: He is alert.  ?Psychiatric:     ?   Mood and Affect: Mood normal.  ? ?   ? ? ? ? ? ?Assessment & Plan:  ? ? ?See Problem List for Assessment and Plan of chronic medical problems.  ? ? ? ? ?

## 2021-12-31 ENCOUNTER — Encounter: Payer: Self-pay | Admitting: Internal Medicine

## 2021-12-31 ENCOUNTER — Ambulatory Visit (INDEPENDENT_AMBULATORY_CARE_PROVIDER_SITE_OTHER): Payer: Medicare Other | Admitting: Internal Medicine

## 2021-12-31 DIAGNOSIS — R109 Unspecified abdominal pain: Secondary | ICD-10-CM | POA: Insufficient documentation

## 2021-12-31 DIAGNOSIS — R10A1 Flank pain, right side: Secondary | ICD-10-CM | POA: Insufficient documentation

## 2021-12-31 DIAGNOSIS — I10 Essential (primary) hypertension: Secondary | ICD-10-CM

## 2021-12-31 NOTE — Assessment & Plan Note (Signed)
Acute ?Started 1 week ago-woke up with pain that improved with Tylenol, but lasted all day ?Pain was not worse with changes in position or movement.  No radiation ?Had pain a few times since then, but not daily ?Pain has resolved-has not had any pain today ?Denies any GI/GU symptoms ?Exam is normal ?At this point we decided to monitor only since he is no longer having any pain-if pain recurs he will let me know and I will can order a CT scan ?

## 2021-12-31 NOTE — Assessment & Plan Note (Signed)
Chronic ?Blood pressure well controlled ?Continue diltiazem 120 mg daily, metoprolol 25 mg twice daily ?

## 2021-12-31 NOTE — Patient Instructions (Signed)
? ? ? ?  Please call us if your pain recurs.   ?

## 2022-01-02 ENCOUNTER — Other Ambulatory Visit: Payer: Self-pay | Admitting: Internal Medicine

## 2022-01-16 ENCOUNTER — Other Ambulatory Visit: Payer: Self-pay | Admitting: Cardiology

## 2022-01-16 ENCOUNTER — Other Ambulatory Visit: Payer: Self-pay | Admitting: Internal Medicine

## 2022-01-16 ENCOUNTER — Telehealth: Payer: Self-pay | Admitting: Internal Medicine

## 2022-01-16 NOTE — Telephone Encounter (Signed)
N/A, VM full unable to leave a message for patient to call back to schedule Medicare Annual Wellness Visit  ? ?Last AWV  02/14/20 ? ?Please schedule at anytime with [facility] if patient calls the office back.   ?  ? ?Any questions, please call me at [phone]  ?

## 2022-01-23 LAB — HM DIABETES EYE EXAM

## 2022-01-30 ENCOUNTER — Encounter: Payer: Self-pay | Admitting: Internal Medicine

## 2022-01-30 NOTE — Progress Notes (Signed)
Outside notes received. Information abstracted. Notes sent to scan.  

## 2022-02-10 ENCOUNTER — Other Ambulatory Visit: Payer: Self-pay | Admitting: Cardiology

## 2022-02-11 ENCOUNTER — Telehealth: Payer: Self-pay

## 2022-02-11 MED ORDER — DILTIAZEM HCL 30 MG PO TABS: 30.0000 mg | ORAL_TABLET | Freq: Four times a day (QID) | ORAL | 1 refills | Status: DC | PRN

## 2022-02-11 NOTE — Telephone Encounter (Signed)
Pt's wife calling requesting a refill on diltiazem 30 mg tablet, this medication was D/C by another provider, would Dr. Curt Bears like to reorder this medication? Please address ?

## 2022-02-11 NOTE — Telephone Encounter (Signed)
Sherri, RN can you please reorder this to add back to pt's medication list? Thanks  ?

## 2022-02-11 NOTE — Telephone Encounter (Signed)
Ok to refill 

## 2022-02-11 NOTE — Telephone Encounter (Signed)
Rx sent in. ?Wife notified. ?

## 2022-02-13 ENCOUNTER — Encounter: Payer: Self-pay | Admitting: Podiatry

## 2022-02-13 ENCOUNTER — Ambulatory Visit (INDEPENDENT_AMBULATORY_CARE_PROVIDER_SITE_OTHER): Payer: Medicare Other | Admitting: Podiatry

## 2022-02-13 DIAGNOSIS — D2372 Other benign neoplasm of skin of left lower limb, including hip: Secondary | ICD-10-CM | POA: Diagnosis not present

## 2022-02-13 DIAGNOSIS — E1142 Type 2 diabetes mellitus with diabetic polyneuropathy: Secondary | ICD-10-CM

## 2022-02-13 DIAGNOSIS — M7752 Other enthesopathy of left foot: Secondary | ICD-10-CM | POA: Diagnosis not present

## 2022-02-13 DIAGNOSIS — D689 Coagulation defect, unspecified: Secondary | ICD-10-CM

## 2022-02-13 DIAGNOSIS — B351 Tinea unguium: Secondary | ICD-10-CM | POA: Diagnosis not present

## 2022-02-13 DIAGNOSIS — M7751 Other enthesopathy of right foot: Secondary | ICD-10-CM | POA: Diagnosis not present

## 2022-02-13 DIAGNOSIS — D2371 Other benign neoplasm of skin of right lower limb, including hip: Secondary | ICD-10-CM

## 2022-02-13 DIAGNOSIS — M79676 Pain in unspecified toe(s): Secondary | ICD-10-CM | POA: Diagnosis not present

## 2022-02-13 MED ORDER — DEXAMETHASONE SODIUM PHOSPHATE 120 MG/30ML IJ SOLN
4.0000 mg | Freq: Once | INTRAMUSCULAR | Status: AC
  Administered 2022-02-13: 4 mg via INTRA_ARTICULAR

## 2022-02-13 NOTE — Progress Notes (Signed)
He presents today chief complaint of painful elongated toenails and a painful subfifth metatarsal lesion.  He states that this time to trend this painful area out.  Objective: Vital signs are stable he is alert and oriented x3.  Pulses are palpable.  He has mild hammertoe deformities he has bursitis beneath the fifth metatarsal head with a palpable bursa left foot.  He has an overlying benign skin lesion no open lesions or wounds are noted.  Toenails are long thick yellow dystrophic and clinical mycotic.  Assessment: Pain in limb secondary to onychomycosis bursitis of fifth metatarsal head left and painful benign skin lesion.  Plan: Debrided painful benign skin lesion I also injected the bursa today with 2 mg of dexamethasone and local anesthetic.  I also debrided toenails 1 through 5 bilateral.  Follow-up with me in 2 months

## 2022-02-19 ENCOUNTER — Encounter: Payer: Self-pay | Admitting: Internal Medicine

## 2022-02-19 NOTE — Progress Notes (Unsigned)
Subjective:    Patient ID: Brett Franklin, male    DOB: Dec 19, 1925, 88 y.o.   MRN: [id number]     HPI Jveon is here for follow up of his chronic medical problems, including hypertension, diabetes, A-fib, hyperlipidemia, hypothyroid, anxiety.  He is taking his medications as prescribed.  He denies concerns  He has no concerns.   Medications and allergies reviewed with patient and updated if appropriate.  Current Outpatient Medications on File Prior to Visit  Medication Sig Dispense Refill   acetaminophen-codeine (TYLENOL #3) 300-30 MG tablet Take 1 tablet by mouth every 8 (eight) hours as needed for moderate pain. 15 tablet 0   apixaban (ELIQUIS) 5 MG TABS tablet TAKE 1 TABLET(5 MG) BY MOUTH TWICE DAILY 180 tablet 3   blood glucose meter kit and supplies KIT Dispense based on patient and insurance preference. Use to text blood sugars daily. 1 each 0   diltiazem (CARDIZEM CD) 120 MG 24 hr capsule TAKE 1 CAPSULE(120 MG) BY MOUTH DAILY 90 capsule 0   diltiazem (CARDIZEM) 30 MG tablet Take 1 tablet (30 mg total) by mouth every 6 (six) hours as needed. 30 tablet 1   glucose blood test strip Use to test blood sugars once daily. Dx code-E11.49 100 each 3   JANUMET 50-500 MG tablet TAKE 1 TABLET BY MOUTH DAILY 90 tablet 1   Lancets (FREESTYLE) lancets Use to check blood sugars daily. 100 each 1   levothyroxine (SYNTHROID) 50 MCG tablet Take 1 tablet (50 mcg total) by mouth daily. 90 tablet 3   LORazepam (ATIVAN) 0.5 MG tablet TAKE 1 TABLET(0.5 MG) BY MOUTH EVERY 12 HOURS AS NEEDED FOR ANXIETY 20 tablet 0   metoprolol tartrate (LOPRESSOR) 25 MG tablet Take 25 mg by mouth 2 (two) times daily.     mometasone (NASONEX) 50 MCG/ACT nasal spray SMARTSIG:2 Spray(s) Both Nares Every Night     MULTAQ 400 MG tablet TAKE 1 TABLET(400 MG) BY MOUTH TWICE DAILY WITH A MEAL 60 tablet 5   mupirocin ointment (BACTROBAN) 2 % Apply to wound after soaking BID 30 g 1   polyethylene glycol (MIRALAX /  GLYCOLAX) 17 g packet Take 17 g by mouth daily.     pravastatin (PRAVACHOL) 20 MG tablet TAKE 1 TABLET(20 MG) BY MOUTH DAILY 90 tablet 1   tretinoin (RETIN-A) 0.05 % cream Apply 1 application. topically at bedtime.     VIAGRA 100 MG tablet      No current facility-administered medications on file prior to visit.     Review of Systems  Constitutional:  Negative for fever.  Respiratory:  Negative for cough, shortness of breath and wheezing.   Cardiovascular:  Negative for chest pain, palpitations and leg swelling.  Gastrointestinal:  Negative for abdominal pain.  Musculoskeletal:  Positive for neck pain.  Neurological:  Negative for light-headedness and headaches.      Objective:   Vitals:   02/20/22 0747  BP: 126/76  Pulse: (!) 51  Temp: 98 F (36.7 C)  SpO2: 99%   BP Readings from Last 3 Encounters:  02/20/22 126/76  12/31/21 136/76  11/21/21 122/78   Wt Readings from Last 3 Encounters:  02/20/22 157 lb (71.2 kg)  12/31/21 161 lb (73 kg)  11/21/21 162 lb (73.5 kg)   Body mass index is 22.53 kg/m.    Physical Exam Constitutional:      General: He is not in acute distress.    Appearance: Normal appearance. He is not  ill-appearing.  HENT:     Head: Normocephalic and atraumatic.  Eyes:     Conjunctiva/sclera: Conjunctivae normal.  Cardiovascular:     Rate and Rhythm: Normal rate and regular rhythm.     Heart sounds: Normal heart sounds. No murmur heard. Pulmonary:     Effort: Pulmonary effort is normal. No respiratory distress.     Breath sounds: Normal breath sounds. No wheezing or rales.  Musculoskeletal:     Right lower leg: No edema.     Left lower leg: No edema.  Skin:    General: Skin is warm and dry.     Findings: No rash.  Neurological:     Mental Status: He is alert. Mental status is at baseline.  Psychiatric:        Mood and Affect: Mood normal.       Lab Results  Component Value Date   WBC 8.8 11/20/2020   HGB 14.0 11/20/2020   HCT 41.0  11/20/2020   PLT 201.0 11/20/2020   GLUCOSE 121 (H) 07/26/2021   CHOL 113 11/20/2020   TRIG 43.0 11/20/2020   HDL 66.60 11/20/2020   LDLCALC 38 11/20/2020   ALT 10 07/26/2021   AST 15 07/26/2021   NA 139 07/26/2021   K 4.2 07/26/2021   CL 104 07/26/2021   CREATININE 1.19 07/26/2021   BUN 25 (H) 07/26/2021   CO2 26 07/26/2021   TSH 4.38 07/26/2021   INR 0.99 12/21/2009   HGBA1C 6.2 07/26/2021   MICROALBUR 2.8 (H) 11/24/2019     Assessment & Plan:    See Problem List for Assessment and Plan of chronic medical problems.

## 2022-02-19 NOTE — Patient Instructions (Addendum)
     Blood work was ordered.     Medications changes include :   none   Your prescription(s) have been sent to your pharmacy.    A referral was ordered for XX.     Someone from that office will call you to schedule an appointment.    Return in about 3 months (around 05/23/2022) for follow up.

## 2022-02-20 ENCOUNTER — Ambulatory Visit (INDEPENDENT_AMBULATORY_CARE_PROVIDER_SITE_OTHER): Payer: Medicare Other | Admitting: Internal Medicine

## 2022-02-20 VITALS — BP 126/76 | HR 51 | Temp 98.0°F | Ht 70.0 in | Wt 157.0 lb

## 2022-02-20 DIAGNOSIS — I480 Paroxysmal atrial fibrillation: Secondary | ICD-10-CM | POA: Diagnosis not present

## 2022-02-20 DIAGNOSIS — I10 Essential (primary) hypertension: Secondary | ICD-10-CM

## 2022-02-20 DIAGNOSIS — E7849 Other hyperlipidemia: Secondary | ICD-10-CM

## 2022-02-20 DIAGNOSIS — E039 Hypothyroidism, unspecified: Secondary | ICD-10-CM | POA: Diagnosis not present

## 2022-02-20 DIAGNOSIS — Z85820 Personal history of malignant melanoma of skin: Secondary | ICD-10-CM

## 2022-02-20 DIAGNOSIS — E1149 Type 2 diabetes mellitus with other diabetic neurological complication: Secondary | ICD-10-CM | POA: Diagnosis not present

## 2022-02-20 DIAGNOSIS — F419 Anxiety disorder, unspecified: Secondary | ICD-10-CM

## 2022-02-20 LAB — COMPREHENSIVE METABOLIC PANEL
ALT: 12 U/L (ref 0–53)
AST: 16 U/L (ref 0–37)
Albumin: 4.5 g/dL (ref 3.5–5.2)
Alkaline Phosphatase: 46 U/L (ref 39–117)
BUN: 28 mg/dL — ABNORMAL HIGH (ref 6–23)
CO2: 27 mEq/L (ref 19–32)
Calcium: 9.5 mg/dL (ref 8.4–10.5)
Chloride: 104 mEq/L (ref 96–112)
Creatinine, Ser: 1.36 mg/dL (ref 0.40–1.50)
GFR: 44.64 mL/min — ABNORMAL LOW (ref >60.00)
Glucose, Bld: 119 mg/dL — ABNORMAL HIGH (ref 70–99)
Potassium: 4.3 mEq/L (ref 3.5–5.1)
Sodium: 139 mEq/L (ref 135–145)
Total Bilirubin: 0.7 mg/dL (ref 0.2–1.2)
Total Protein: 7.1 g/dL (ref 6.0–8.3)

## 2022-02-20 LAB — LIPID PANEL
Cholesterol: 117 mg/dL (ref 0–200)
HDL: 70.3 mg/dL (ref >39.00)
LDL Cholesterol: 40 mg/dL (ref 0–99)
NonHDL: 46.25
Total CHOL/HDL Ratio: 2
Triglycerides: 32 mg/dL (ref 0.0–149.0)
VLDL: 6.4 mg/dL (ref 0.0–40.0)

## 2022-02-20 LAB — CBC WITH DIFFERENTIAL/PLATELET
Basophils Absolute: 0 10*3/uL (ref 0.0–0.1)
Basophils Relative: 0.5 % (ref 0.0–3.0)
Eosinophils Absolute: 0.1 10*3/uL (ref 0.0–0.7)
Eosinophils Relative: 1.2 % (ref 0.0–5.0)
HCT: 42.5 % (ref 39.0–52.0)
Hemoglobin: 14.1 g/dL (ref 13.0–17.0)
Lymphocytes Relative: 17.4 % (ref 12.0–46.0)
Lymphs Abs: 1.4 10*3/uL (ref 0.7–4.0)
MCHC: 33.1 g/dL (ref 30.0–36.0)
MCV: 97.3 fl (ref 78.0–100.0)
Monocytes Absolute: 0.9 10*3/uL (ref 0.1–1.0)
Monocytes Relative: 11.6 % (ref 3.0–12.0)
Neutro Abs: 5.5 10*3/uL (ref 1.4–7.7)
Neutrophils Relative %: 69.3 % (ref 43.0–77.0)
Platelets: 201 10*3/uL (ref 150.0–400.0)
RBC: 4.37 Mil/uL (ref 4.22–5.81)
RDW: 15.1 % (ref 11.5–15.5)
WBC: 8 10*3/uL (ref 4.0–10.5)

## 2022-02-20 LAB — HEMOGLOBIN A1C: Hgb A1c MFr Bld: 6.3 % (ref 4.6–6.5)

## 2022-02-20 LAB — TSH: TSH: 2.56 u[IU]/mL (ref 0.35–5.50)

## 2022-02-20 MED ORDER — ACETAMINOPHEN-CODEINE 300-30 MG PO TABS: 1.0000 | ORAL_TABLET | Freq: Three times a day (TID) | ORAL | 0 refills | Status: DC | PRN

## 2022-02-20 MED ORDER — LORAZEPAM 0.5 MG PO TABS: ORAL_TABLET | ORAL | 2 refills | Status: DC

## 2022-02-20 NOTE — Assessment & Plan Note (Addendum)
Chronic Asymptomatic Following with cardiology On Eliquis 5 mg twice daily, Multaq 400 mg twice daily and metoprolol 25 mg twice daily, diltiazem 120 mg daily CBC, CMP

## 2022-02-20 NOTE — Assessment & Plan Note (Signed)
Chronic  Lab Results  Component Value Date   HGBA1C 6.2 07/26/2021   Sugars well controlled Testing sugars 1 times a day Check A1c, urine microalbumin today Continue Janumet 50-500 mg daily Stressed regular exercise, diabetic diet

## 2022-02-20 NOTE — Assessment & Plan Note (Signed)
Chronic Blood pressure well controlled CMP Continue diltiazem 120 mg daily, metoprolol 25 mg twice daily 

## 2022-02-20 NOTE — Assessment & Plan Note (Signed)
Chronic Regular exercise and healthy diet encouraged Check lipid panel  Continue pravastatin 20 mg daily 

## 2022-02-20 NOTE — Assessment & Plan Note (Signed)
Chronic  Clinically euthyroid Currently taking levothyroxine 50 mcg daily Check tsh  Titrate med dose if needed  

## 2022-02-20 NOTE — Assessment & Plan Note (Signed)
Chronic Controlled, Stable Continue lorazepam 0.5 mg daily as needed 

## 2022-02-20 NOTE — Assessment & Plan Note (Signed)
Sees dermatology regularly

## 2022-04-15 ENCOUNTER — Other Ambulatory Visit: Payer: Self-pay | Admitting: Cardiology

## 2022-04-29 ENCOUNTER — Ambulatory Visit (INDEPENDENT_AMBULATORY_CARE_PROVIDER_SITE_OTHER): Payer: Medicare Other | Admitting: Podiatry

## 2022-04-29 ENCOUNTER — Encounter: Payer: Self-pay | Admitting: Podiatry

## 2022-04-29 DIAGNOSIS — B351 Tinea unguium: Secondary | ICD-10-CM | POA: Diagnosis not present

## 2022-04-29 DIAGNOSIS — M79676 Pain in unspecified toe(s): Secondary | ICD-10-CM

## 2022-04-29 DIAGNOSIS — D2372 Other benign neoplasm of skin of left lower limb, including hip: Secondary | ICD-10-CM | POA: Diagnosis not present

## 2022-04-29 DIAGNOSIS — D689 Coagulation defect, unspecified: Secondary | ICD-10-CM | POA: Diagnosis not present

## 2022-04-29 DIAGNOSIS — D2371 Other benign neoplasm of skin of right lower limb, including hip: Secondary | ICD-10-CM | POA: Diagnosis not present

## 2022-04-29 DIAGNOSIS — E1142 Type 2 diabetes mellitus with diabetic polyneuropathy: Secondary | ICD-10-CM

## 2022-04-29 DIAGNOSIS — M7751 Other enthesopathy of right foot: Secondary | ICD-10-CM

## 2022-04-29 DIAGNOSIS — M7752 Other enthesopathy of left foot: Secondary | ICD-10-CM

## 2022-04-29 NOTE — Progress Notes (Signed)
He presents today chief complaint of painful elongated toenails and calluses.  Objective: Pulses are palpable.  No open lesions or wounds.  Toenails are long thick yellow dystrophic clinically mycotic.  Multiple reactive hyper keratomas plantar aspect of the bilateral foot particularly Sub fifth met bilateral.  Assessment: Pain in limb secondary to benign skin lesions and painful mycotic nails.  Plan: Debridement of toenails and calluses bilateral.

## 2022-05-22 NOTE — Progress Notes (Signed)
Subjective:    Patient ID: Brett Franklin, male    DOB: [birth year], 88 y.o.   MRN: [id number]     HPI Hamza is here for follow up of his chronic medical problems, including htn, afib, hypothyroid, DM, anxiety, hld, neck pain  He has no concerns.  Taking his medications as prescribed.   Head feels full all the time - not nasal congestion.   Has mucus come up when he eats. No gerd/reflux.  No dysphagia.  Just once in a while.  Has a little cough with it.    No falls  Medications and allergies reviewed with patient and updated if appropriate.  Current Outpatient Medications on File Prior to Visit  Medication Sig Dispense Refill   apixaban (ELIQUIS) 5 MG TABS tablet TAKE 1 TABLET(5 MG) BY MOUTH TWICE DAILY 180 tablet 3   blood glucose meter kit and supplies KIT Dispense based on patient and insurance preference. Use to text blood sugars daily. 1 each 0   diltiazem (CARDIZEM CD) 120 MG 24 hr capsule TAKE 1 CAPSULE(120 MG) BY MOUTH DAILY--KEEP FOLLOW UP APPOINTMENT TO RECEIVE FURTHER REFILLS AT APPOINTMENT. 90 capsule 0   diltiazem (CARDIZEM) 30 MG tablet Take 1 tablet (30 mg total) by mouth every 6 (six) hours as needed. 30 tablet 1   glucose blood test strip Use to test blood sugars once daily. Dx code-E11.49 100 each 3   JANUMET 50-500 MG tablet TAKE 1 TABLET BY MOUTH DAILY 90 tablet 1   Lancets (FREESTYLE) lancets Use to check blood sugars daily. 100 each 1   levothyroxine (SYNTHROID) 50 MCG tablet Take 1 tablet (50 mcg total) by mouth daily. 90 tablet 3   LORazepam (ATIVAN) 0.5 MG tablet TAKE 1 TABLET(0.5 MG) BY MOUTH EVERY 12 HOURS AS NEEDED FOR ANXIETY 20 tablet 2   metoprolol tartrate (LOPRESSOR) 25 MG tablet Take 25 mg by mouth 2 (two) times daily.     mometasone (NASONEX) 50 MCG/ACT nasal spray SMARTSIG:2 Spray(s) Both Nares Every Night     MULTAQ 400 MG tablet TAKE 1 TABLET(400 MG) BY MOUTH TWICE DAILY WITH A MEAL 60 tablet 5   mupirocin ointment (BACTROBAN) 2 % Apply  to wound after soaking BID 30 g 1   polyethylene glycol (MIRALAX / GLYCOLAX) 17 g packet Take 17 g by mouth daily.     pravastatin (PRAVACHOL) 20 MG tablet TAKE 1 TABLET(20 MG) BY MOUTH DAILY 90 tablet 1   tretinoin (RETIN-A) 0.05 % cream Apply 1 application. topically at bedtime.     VIAGRA 100 MG tablet      No current facility-administered medications on file prior to visit.     Review of Systems  Constitutional:  Negative for fever.  HENT:  Negative for trouble swallowing.   Respiratory:  Negative for cough, shortness of breath and wheezing.   Cardiovascular:  Negative for chest pain, palpitations and leg swelling.  Gastrointestinal:  Negative for abdominal pain and nausea.       No gerd  Neurological:  Negative for light-headedness and headaches.       Objective:   Vitals:   05/23/22 0752  BP: 128/80  Pulse: 63  Temp: 98 F (36.7 C)  SpO2: 99%   BP Readings from Last 3 Encounters:  05/23/22 128/80  02/20/22 126/76  12/31/21 136/76   Wt Readings from Last 3 Encounters:  05/23/22 157 lb (71.2 kg)  02/20/22 157 lb (71.2 kg)  12/31/21 161 lb (73 kg)  Body mass index is 22.53 kg/m.    Physical Exam Constitutional:      General: He is not in acute distress.    Appearance: Normal appearance. He is not ill-appearing.  HENT:     Head: Normocephalic and atraumatic.  Eyes:     Conjunctiva/sclera: Conjunctivae normal.  Cardiovascular:     Rate and Rhythm: Normal rate and regular rhythm.     Heart sounds: Normal heart sounds. No murmur heard. Pulmonary:     Effort: Pulmonary effort is normal. No respiratory distress.     Breath sounds: Normal breath sounds. No wheezing or rales.  Musculoskeletal:     Right lower leg: No edema.     Left lower leg: No edema.  Skin:    General: Skin is warm and dry.     Findings: No rash.  Neurological:     Mental Status: He is alert. Mental status is at baseline.  Psychiatric:        Mood and Affect: Mood normal.         Lab Results  Component Value Date   WBC 8.0 02/20/2022   HGB 14.1 02/20/2022   HCT 42.5 02/20/2022   PLT 201.0 02/20/2022   GLUCOSE 119 (H) 02/20/2022   CHOL 117 02/20/2022   TRIG 32.0 02/20/2022   HDL 70.30 02/20/2022   LDLCALC 40 02/20/2022   ALT 12 02/20/2022   AST 16 02/20/2022   NA 139 02/20/2022   K 4.3 02/20/2022   CL 104 02/20/2022   CREATININE 1.36 02/20/2022   BUN 28 (H) 02/20/2022   CO2 27 02/20/2022   TSH 2.56 02/20/2022   INR 0.99 12/21/2009   HGBA1C 6.3 02/20/2022   MICROALBUR 2.8 (H) 11/24/2019     Assessment & Plan:    See Problem List for Assessment and Plan of chronic medical problems.

## 2022-05-22 NOTE — Patient Instructions (Addendum)
     Blood work was ordered.     Medications changes include :   none   Your prescription(s) have been sent to your pharmacy.     Return in about 3 months (around 08/23/2022) for follow up.

## 2022-05-23 ENCOUNTER — Ambulatory Visit (INDEPENDENT_AMBULATORY_CARE_PROVIDER_SITE_OTHER): Payer: Medicare Other | Admitting: Internal Medicine

## 2022-05-23 ENCOUNTER — Encounter: Payer: Self-pay | Admitting: Internal Medicine

## 2022-05-23 VITALS — BP 128/80 | HR 63 | Temp 98.0°F | Ht 70.0 in | Wt 157.0 lb

## 2022-05-23 DIAGNOSIS — I10 Essential (primary) hypertension: Secondary | ICD-10-CM | POA: Diagnosis not present

## 2022-05-23 DIAGNOSIS — E1149 Type 2 diabetes mellitus with other diabetic neurological complication: Secondary | ICD-10-CM | POA: Diagnosis not present

## 2022-05-23 DIAGNOSIS — E039 Hypothyroidism, unspecified: Secondary | ICD-10-CM

## 2022-05-23 DIAGNOSIS — F419 Anxiety disorder, unspecified: Secondary | ICD-10-CM

## 2022-05-23 DIAGNOSIS — I480 Paroxysmal atrial fibrillation: Secondary | ICD-10-CM | POA: Diagnosis not present

## 2022-05-23 DIAGNOSIS — E7849 Other hyperlipidemia: Secondary | ICD-10-CM

## 2022-05-23 DIAGNOSIS — M542 Cervicalgia: Secondary | ICD-10-CM

## 2022-05-23 LAB — CBC WITH DIFFERENTIAL/PLATELET
Basophils Absolute: 0 10*3/uL (ref 0.0–0.1)
Basophils Relative: 0.3 % (ref 0.0–3.0)
Eosinophils Absolute: 0.1 10*3/uL (ref 0.0–0.7)
Eosinophils Relative: 0.6 % (ref 0.0–5.0)
HCT: 39.9 % (ref 39.0–52.0)
Hemoglobin: 13.5 g/dL (ref 13.0–17.0)
Lymphocytes Relative: 15.6 % (ref 12.0–46.0)
Lymphs Abs: 1.5 10*3/uL (ref 0.7–4.0)
MCHC: 33.9 g/dL (ref 30.0–36.0)
MCV: 97.2 fl (ref 78.0–100.0)
Monocytes Absolute: 1 10*3/uL (ref 0.1–1.0)
Monocytes Relative: 9.8 % (ref 3.0–12.0)
Neutro Abs: 7.3 10*3/uL (ref 1.4–7.7)
Neutrophils Relative %: 73.7 % (ref 43.0–77.0)
Platelets: 168 10*3/uL (ref 150.0–400.0)
RBC: 4.1 Mil/uL — ABNORMAL LOW (ref 4.22–5.81)
RDW: 15 % (ref 11.5–15.5)
WBC: 9.9 10*3/uL (ref 4.0–10.5)

## 2022-05-23 LAB — COMPREHENSIVE METABOLIC PANEL
ALT: 18 U/L (ref 0–53)
AST: 18 U/L (ref 0–37)
Albumin: 4.2 g/dL (ref 3.5–5.2)
Alkaline Phosphatase: 52 U/L (ref 39–117)
BUN: 27 mg/dL — ABNORMAL HIGH (ref 6–23)
CO2: 25 mEq/L (ref 19–32)
Calcium: 9.2 mg/dL (ref 8.4–10.5)
Chloride: 102 mEq/L (ref 96–112)
Creatinine, Ser: 1.26 mg/dL (ref 0.40–1.50)
GFR: 48.83 mL/min — ABNORMAL LOW (ref >60.00)
Glucose, Bld: 104 mg/dL — ABNORMAL HIGH (ref 70–99)
Potassium: 4.2 mEq/L (ref 3.5–5.1)
Sodium: 139 mEq/L (ref 135–145)
Total Bilirubin: 0.8 mg/dL (ref 0.2–1.2)
Total Protein: 6.8 g/dL (ref 6.0–8.3)

## 2022-05-23 LAB — HEMOGLOBIN A1C: Hgb A1c MFr Bld: 6.4 % (ref 4.6–6.5)

## 2022-05-23 MED ORDER — ACETAMINOPHEN-CODEINE 300-30 MG PO TABS
1.0000 | ORAL_TABLET | Freq: Three times a day (TID) | ORAL | 0 refills | Status: DC | PRN
Start: 2022-05-23 — End: 2022-08-27

## 2022-05-23 NOTE — Assessment & Plan Note (Signed)
Chronic Asymptomatic Follows with cardiology Paroxysmal On Eliquis 5 mg twice daily, Multaq 400 mg twice daily, metoprolol 25 mg twice daily and diltiazem 120 mg daily CMP, CBC

## 2022-05-23 NOTE — Assessment & Plan Note (Signed)
Chronic Thyroid Last TSH within normal limits Continue levothyroxine 50 mcg daily

## 2022-05-23 NOTE — Assessment & Plan Note (Signed)
Chronic Intermittent Tylenol 3 as needed, which she does not take often

## 2022-05-23 NOTE — Assessment & Plan Note (Signed)
Chronic Blood pressure well controlled CMP Continue diltiazem 120 mg daily, metoprolol 25 mg twice daily 

## 2022-05-23 NOTE — Assessment & Plan Note (Signed)
Chronic Controlled, Stable Continue lorazepam 0.5 mg daily as needed 

## 2022-05-23 NOTE — Assessment & Plan Note (Signed)
Chronic Controlled Check A1c Continue Janumet 50-500 mg daily

## 2022-05-23 NOTE — Assessment & Plan Note (Signed)
Chronic Regular exercise and healthy diet encouraged Lipids controlled Continue pravastatin 20 mg daily

## 2022-05-27 ENCOUNTER — Telehealth: Payer: Self-pay

## 2022-05-27 NOTE — Telephone Encounter (Signed)
-----   Message from Marcina Millard, [place] sent at 05/27/2022  8:59 AM EDT ----- Please contact patient with results.  Thanks

## 2022-05-29 ENCOUNTER — Other Ambulatory Visit: Payer: Self-pay | Admitting: Internal Medicine

## 2022-06-23 ENCOUNTER — Encounter (HOSPITAL_BASED_OUTPATIENT_CLINIC_OR_DEPARTMENT_OTHER): Payer: Self-pay | Admitting: Emergency Medicine

## 2022-06-23 ENCOUNTER — Other Ambulatory Visit: Payer: Self-pay

## 2022-06-23 ENCOUNTER — Emergency Department (HOSPITAL_BASED_OUTPATIENT_CLINIC_OR_DEPARTMENT_OTHER): Payer: Medicare Other

## 2022-06-23 ENCOUNTER — Emergency Department (HOSPITAL_BASED_OUTPATIENT_CLINIC_OR_DEPARTMENT_OTHER)
Admission: EM | Admit: 2022-06-23 | Discharge: 2022-06-23 | Disposition: A | Discharge status: Home / Self Care | Payer: Medicare Other | Attending: Emergency Medicine | Admitting: Emergency Medicine

## 2022-06-23 DIAGNOSIS — S0990XA Unspecified injury of head, initial encounter: Secondary | ICD-10-CM

## 2022-06-23 DIAGNOSIS — W01198A Fall on same level from slipping, tripping and stumbling with subsequent striking against other object, initial encounter: Secondary | ICD-10-CM | POA: Insufficient documentation

## 2022-06-23 DIAGNOSIS — Z23 Encounter for immunization: Secondary | ICD-10-CM | POA: Diagnosis not present

## 2022-06-23 DIAGNOSIS — Z7901 Long term (current) use of anticoagulants: Secondary | ICD-10-CM | POA: Diagnosis not present

## 2022-06-23 DIAGNOSIS — S51011A Laceration without foreign body of right elbow, initial encounter: Secondary | ICD-10-CM

## 2022-06-23 DIAGNOSIS — Z79899 Other long term (current) drug therapy: Secondary | ICD-10-CM | POA: Diagnosis not present

## 2022-06-23 DIAGNOSIS — S0001XA Abrasion of scalp, initial encounter: Secondary | ICD-10-CM

## 2022-06-23 DIAGNOSIS — W19XXXA Unspecified fall, initial encounter: Secondary | ICD-10-CM

## 2022-06-23 DIAGNOSIS — E119 Type 2 diabetes mellitus without complications: Secondary | ICD-10-CM | POA: Diagnosis not present

## 2022-06-23 DIAGNOSIS — I10 Essential (primary) hypertension: Secondary | ICD-10-CM | POA: Diagnosis not present

## 2022-06-23 MED ORDER — BACITRACIN ZINC 500 UNIT/GM EX OINT
TOPICAL_OINTMENT | Freq: Once | CUTANEOUS | Status: AC
  Administered 2022-06-23: 1 via TOPICAL
  Filled 2022-06-23: qty 28.35

## 2022-06-23 MED ORDER — TETANUS-DIPHTH-ACELL PERTUSSIS 5-2.5-18.5 LF-MCG/0.5 IM SUSY
0.5000 mL | PREFILLED_SYRINGE | Freq: Once | INTRAMUSCULAR | Status: AC
  Administered 2022-06-23: 0.5 mL via INTRAMUSCULAR
  Filled 2022-06-23: qty 0.5

## 2022-06-23 NOTE — ED Notes (Signed)
Right elbow wound cleansed; bacterin applied and area dressing with non stick dressing.

## 2022-06-23 NOTE — ED Provider Notes (Signed)
[facility] DEPT Provider Note   CSN: [id number] Arrival date & time: 06/23/22  1452     History  Chief Complaint  Patient presents with   Brett Franklin is a 88 y.o. male.  Brett Franklin is a 88 y.o. male with a history of hypertension, diabetes, stroke, BPH, on chronic anticoagulation, who presents to the ED for evaluation of fall and head injury.  Patient reports around 1130 this morning he was stepping back after throwing something away and lost his footing causing him to fall backwards striking the back of his head on the concrete.  Small abrasion to the back of the head with some bleeding.  He denies any headache, loss of consciousness, nausea, vomiting, numbness or weakness.  Denies neck or back pain.  No chest or abdominal pain and no pain over his hips or other extremities after the fall.  Does report a small abrasion to the right elbow.  Patient is on Eliquis.  The history is provided by the patient, the spouse and medical records.  Fall Pertinent negatives include no headaches.       Home Medications Prior to Admission medications   Medication Sig Start Date End Date Taking? Authorizing Provider  acetaminophen-codeine (TYLENOL #3) 300-30 MG tablet Take 1 tablet by mouth every 8 (eight) hours as needed for moderate pain. 05/23/22   Binnie Rail, MD  apixaban (ELIQUIS) 5 MG TABS tablet TAKE 1 TABLET(5 MG) BY MOUTH TWICE DAILY 11/21/21   Binnie Rail, MD  blood glucose meter kit and supplies KIT Dispense based on patient and insurance preference. Use to text blood sugars daily. 12/12/20   Binnie Rail, MD  diltiazem (CARDIZEM CD) 120 MG 24 hr capsule TAKE 1 CAPSULE(120 MG) BY MOUTH DAILY--KEEP FOLLOW UP APPOINTMENT TO RECEIVE FURTHER REFILLS AT APPOINTMENT. 04/15/22   Camnitz, Ocie Doyne, MD  diltiazem (CARDIZEM) 30 MG tablet Take 1 tablet (30 mg total) by mouth every 6 (six) hours as needed. 02/11/22   Camnitz, Ocie Doyne, MD  glucose  blood test strip Use to test blood sugars once daily. Dx code-E11.49 12/12/20   Binnie Rail, MD  JANUMET 50-500 MG tablet TAKE 1 TABLET BY MOUTH DAILY 01/16/22   Binnie Rail, MD  Lancets (FREESTYLE) lancets Use to check blood sugars daily. 12/12/20   Binnie Rail, MD  levothyroxine (SYNTHROID) 50 MCG tablet Take 1 tablet (50 mcg total) by mouth daily. 07/30/21   Burns, Claudina Lick, MD  LORazepam (ATIVAN) 0.5 MG tablet TAKE 1 TABLET(0.5 MG) BY MOUTH EVERY 12 HOURS AS NEEDED FOR ANXIETY 02/20/22   Binnie Rail, MD  metoprolol tartrate (LOPRESSOR) 25 MG tablet Take 25 mg by mouth 2 (two) times daily.    [provider]  mometasone (NASONEX) 50 MCG/ACT nasal spray SMARTSIG:2 Spray(s) Both Nares Every Night 09/02/19   [provider]  MULTAQ 400 MG tablet TAKE 1 TABLET(400 MG) BY MOUTH TWICE DAILY WITH A MEAL 05/29/22   Burns, Claudina Lick, MD  mupirocin ointment (BACTROBAN) 2 % Apply to wound after soaking BID 12/08/19   Hyatt, Max T, DPM  polyethylene glycol (MIRALAX / GLYCOLAX) 17 g packet Take 17 g by mouth daily.    [provider]  pravastatin (PRAVACHOL) 20 MG tablet TAKE 1 TABLET(20 MG) BY MOUTH DAILY 01/02/22   Binnie Rail, MD  tretinoin (RETIN-A) 0.05 % cream Apply 1 application. topically at bedtime. 12/18/21   [provider]  VIAGRA 100 MG tablet  12/18/21   [provider]      Allergies    Lisinopril    Review of Systems   Review of Systems  Constitutional:  Negative for chills.  Eyes:  Negative for visual disturbance.  Gastrointestinal:  Negative for nausea and vomiting.  Skin:  Positive for wound.  Neurological:  Negative for syncope, weakness, numbness and headaches.    Physical Exam Updated Vital Signs BP (!) 153/60   Pulse (!) 56   Temp 98.1 F (36.7 C)   Resp 12   Ht _0  (1.778 m)   Wt 68 kg   SpO2 100%   BMI 21.52 kg/m  Physical Exam Vitals and nursing note reviewed.  Constitutional:      General: He is not in acute  distress.    Appearance: Normal appearance. He is well-developed. He is not ill-appearing or diaphoretic.  HENT:     Head: Normocephalic.     Comments: There is a small abrasion to the crown of the head with no current bleeding, no surrounding hematoma, step-off or deformity, negative battle sign Eyes:     General:        Right eye: No discharge.        Left eye: No discharge.  Cardiovascular:     Rate and Rhythm: Normal rate and regular rhythm.     Heart sounds: Normal heart sounds.  Pulmonary:     Effort: Pulmonary effort is normal. No respiratory distress.  Abdominal:     Palpations: Abdomen is soft.     Tenderness: There is no abdominal tenderness.  Musculoskeletal:        General: Deformity present.     Comments: Small superficial skin tear to the posterior aspect of the right elbow, no active bleeding, no surrounding swelling or deformity, full range of motion of the elbow without pain, all other joints are supple and easily movable, all compartments soft, no midline spinal tenderness  Skin:    General: Skin is warm and dry.  Neurological:     Mental Status: He is alert and oriented to person, place, and time.     Coordination: Coordination normal.     Comments: Speech is clear, able to follow commands CN III-XII intact Normal strength in upper and lower extremities bilaterally including dorsiflexion and plantar flexion, strong and equal grip strength Sensation normal to light and sharp touch Moves extremities without ataxia, coordination intact  Psychiatric:        Mood and Affect: Mood normal.        Behavior: Behavior normal.     ED Results / Procedures / Treatments   Labs (all labs ordered are listed, but only abnormal results are displayed) Labs Reviewed - No data to display  EKG None  Radiology CT Head Wo Contrast  Result Date: 06/23/2022 CLINICAL DATA:  Hit back of head, fall EXAM: CT HEAD WITHOUT CONTRAST CT CERVICAL SPINE WITHOUT CONTRAST TECHNIQUE:  Multidetector CT imaging of the head and cervical spine was performed following the standard protocol without intravenous contrast. Multiplanar CT image reconstructions of the cervical spine were also generated. RADIATION DOSE REDUCTION: This exam was performed according to the departmental dose-optimization program which includes automated exposure control, adjustment of the mA and/or kV according to patient size and/or use of iterative reconstruction technique. COMPARISON:  CT brain 12/22/2009 FINDINGS: CT HEAD FINDINGS Brain: No acute territorial infarct, hemorrhage, or intracranial mass. Atrophy. Mild chronic small vessel ischemic changes of the white matter.  The ventricles are nonenlarged. Vascular: No hyperdense vessels.  Carotid vascular calcification. Skull: Normal. Negative for fracture or focal lesion. Sinuses/Orbits: No acute finding. Other: Small posterior scalp hematoma CT CERVICAL SPINE FINDINGS Alignment: No subluxation.  Facet alignment within normal limits. Skull base and vertebrae: No acute fracture. No primary bone lesion or focal pathologic process. Soft tissues and spinal canal: No prevertebral fluid or swelling. No visible canal hematoma. Disc levels: Multilevel degenerative change. Moderate disc space narrowing C4-C5 and C5-C6 with advanced degenerative change C6-C7. Facet degenerative changes at multiple levels. Upper chest: Negative. Other: None IMPRESSION: 1. No CT evidence for acute intracranial abnormality. 2. Atrophy and mild chronic small vessel ischemic changes of the white matter 3. No acute osseous abnormality of the cervical spine Electronically Signed   By: Donavan Foil M.D.   On: 06/23/2022 17:28   CT Cervical Spine Wo Contrast  Result Date: 06/23/2022 CLINICAL DATA:  Hit back of head, fall EXAM: CT HEAD WITHOUT CONTRAST CT CERVICAL SPINE WITHOUT CONTRAST TECHNIQUE: Multidetector CT imaging of the head and cervical spine was performed following the standard protocol without  intravenous contrast. Multiplanar CT image reconstructions of the cervical spine were also generated. RADIATION DOSE REDUCTION: This exam was performed according to the departmental dose-optimization program which includes automated exposure control, adjustment of the mA and/or kV according to patient size and/or use of iterative reconstruction technique. COMPARISON:  CT brain 12/22/2009 FINDINGS: CT HEAD FINDINGS Brain: No acute territorial infarct, hemorrhage, or intracranial mass. Atrophy. Mild chronic small vessel ischemic changes of the white matter. The ventricles are nonenlarged. Vascular: No hyperdense vessels.  Carotid vascular calcification. Skull: Normal. Negative for fracture or focal lesion. Sinuses/Orbits: No acute finding. Other: Small posterior scalp hematoma CT CERVICAL SPINE FINDINGS Alignment: No subluxation.  Facet alignment within normal limits. Skull base and vertebrae: No acute fracture. No primary bone lesion or focal pathologic process. Soft tissues and spinal canal: No prevertebral fluid or swelling. No visible canal hematoma. Disc levels: Multilevel degenerative change. Moderate disc space narrowing C4-C5 and C5-C6 with advanced degenerative change C6-C7. Facet degenerative changes at multiple levels. Upper chest: Negative. Other: None IMPRESSION: 1. No CT evidence for acute intracranial abnormality. 2. Atrophy and mild chronic small vessel ischemic changes of the white matter 3. No acute osseous abnormality of the cervical spine Electronically Signed   By: Donavan Foil M.D.   On: 06/23/2022 17:28    Procedures Procedures    Medications Ordered in ED Medications  Tdap (BOOSTRIX) injection 0.5 mL (0.5 mLs Intramuscular Given 06/23/22 1616)  bacitracin ointment (1 Application Topical Given 06/23/22 1615)    ED Course/ Medical Decision Making/ A&P                           Medical Decision Making Risk OTC drugs. Prescription drug management.   88 y.o. male presents to the  ED with complaints of head injury on blood thinners, this involves an extensive number of treatment options, and is a complaint that carries with it a high risk of complications and morbidity.  The differential diagnosis includes intracranial bleeding, skull fracture, concussion, minor head injury  On arrival pt is nontoxic, vitals significant for mild hypertension, baseline heart rate of 55. Exam significant for no focal neurodeficits, small abrasion to the scalp and skin tear to the right elbow, no larger wounds requiring suture repair  Additional history obtained from wife at bedside. Previous records obtained and reviewed   I ordered  medication including tetanus vaccination  Imaging Studies ordered:  I ordered imaging studies which included CT of the head and cervical spine, I independently visualized and interpreted imaging which showed no intracranial bleeding skull fracture or C-spine fracture  ED Course:   Discussed reassuring imaging with patient and wife at bedside.  Fortunately no intracranial bleeding despite anticoagulation.  Patient has small abrasions which were cleaned and dressings applied, no larger wounds requiring suture repair.  Tetanus updated.  Provided return precautions regarding head injury and discussed outpatient follow-up.  Discharged home in good condition.    Portions of this note were generated with [employer]. Dictation errors may occur despite best attempts at proofreading.         Final Clinical Impression(s) / ED Diagnoses Final diagnoses:  Fall, initial encounter  Head injury  Abrasion of scalp, initial encounter  Skin tear of right elbow without complication, initial encounter    Rx / DC Orders ED Discharge Orders     None         Jacqlyn Larsen, Hershal Coria 06/23/22 [place], Dan, DO 06/23/22 2006

## 2022-06-23 NOTE — Discharge Instructions (Addendum)
Your CT scan showed no evidence of bleeding in your brain.  Because you are on a blood thinner anytime you have a head injury you should come in and get checked out.  You have abrasions to your scalp and elbow, you can apply bacitracin ointment and keep these clean, dry and covered.  Monitor for signs of infection such as redness, swelling, increasing pain or drainage if this occurs return to the ED or see your primary care provider.   Sometimes serious problems can develop after a head injury. Please return to the emergency department if you experience any of the following symptoms: Repeated vomiting Headache that gets worse and does not go away Loss of consciousness or inability to stay awake at times when you   normally would be able to Getting more confused, restless or agitated Convulsions or seizures Difficulty walking or feeling off balance Weakness or numbness Vision changes

## 2022-06-23 NOTE — ED Triage Notes (Signed)
Lost balance and hit back of head on cement  Happened around 11:30 am Small lack and swelling to back of head. On eliquis. No neuro changes, no loc, no HA no n/v

## 2022-06-29 ENCOUNTER — Other Ambulatory Visit: Payer: Self-pay | Admitting: Internal Medicine

## 2022-07-04 ENCOUNTER — Ambulatory Visit: Payer: Medicare Other | Admitting: Cardiology

## 2022-07-12 ENCOUNTER — Other Ambulatory Visit: Payer: Self-pay | Admitting: Cardiology

## 2022-07-13 ENCOUNTER — Other Ambulatory Visit: Payer: Self-pay | Admitting: Internal Medicine

## 2022-07-16 ENCOUNTER — Telehealth: Payer: Self-pay | Admitting: Cardiology

## 2022-07-16 MED ORDER — DILTIAZEM HCL ER COATED BEADS 120 MG PO CP24: ORAL_CAPSULE | ORAL | 2 refills | Status: DC

## 2022-07-16 NOTE — Telephone Encounter (Signed)
*  STAT* If patient is at the pharmacy, call can be transferred to refill team.   1. Which medications need to be refilled? (please list name of each medication and dose if known) Diltiazem  2. Which pharmacy/location (including street and city if local pharmacy) is medication to be sent to? [facility] [place] [address], [place]  3. Do they need a 30 day or 90 day supply? for-Please call this in asap please- no mediicine for tomorrow  Need enough until his appointment  10-21-22

## 2022-07-16 NOTE — Telephone Encounter (Signed)
Pt's medication was sent to pt's pharmacy as requested. Confirmation received.  °

## 2022-07-31 ENCOUNTER — Encounter: Payer: Self-pay | Admitting: Podiatry

## 2022-07-31 ENCOUNTER — Ambulatory Visit (INDEPENDENT_AMBULATORY_CARE_PROVIDER_SITE_OTHER): Payer: Medicare Other | Admitting: Podiatry

## 2022-07-31 DIAGNOSIS — D2372 Other benign neoplasm of skin of left lower limb, including hip: Secondary | ICD-10-CM

## 2022-07-31 DIAGNOSIS — D689 Coagulation defect, unspecified: Secondary | ICD-10-CM

## 2022-07-31 DIAGNOSIS — B351 Tinea unguium: Secondary | ICD-10-CM

## 2022-07-31 DIAGNOSIS — D2371 Other benign neoplasm of skin of right lower limb, including hip: Secondary | ICD-10-CM

## 2022-07-31 DIAGNOSIS — M79676 Pain in unspecified toe(s): Secondary | ICD-10-CM | POA: Diagnosis not present

## 2022-07-31 DIAGNOSIS — E1142 Type 2 diabetes mellitus with diabetic polyneuropathy: Secondary | ICD-10-CM | POA: Diagnosis not present

## 2022-07-31 NOTE — Progress Notes (Signed)
He presents today chief complaint of painful elongated toenails and calluses plantar aspect left foot primarily.  Objective: Vital signs stable alert oriented x3 pulses are palpable.  Toenails are long thick yellow dystrophic onychomycotic painful palpation.  He has a reactive hyperkeratotic plantar aspect the Sub fifth metatarsal head of the left foot none open none ulcerative.  Assessment: Pain in limb secondary to onychomycosis and tinea pedis as well as benign skin lesion.  Plan: Debrided benign skin lesion debrided nails 1 through 5 bilaterally and recommended over-the-counter antifungal.

## 2022-08-11 LAB — HM DIABETES EYE EXAM

## 2022-08-16 ENCOUNTER — Other Ambulatory Visit: Payer: Self-pay | Admitting: Internal Medicine

## 2022-08-24 NOTE — Patient Instructions (Addendum)
     Flu immunization administered today.     Medications changes include :   none   If you want to do some physical therapy let me know.    Return in about 3 months (around 11/27/2022) for follow up.

## 2022-08-24 NOTE — Progress Notes (Unsigned)
Subjective:    Patient ID: Brett Franklin, male    DOB: 09-Oct-1925, 88 y.o.   MRN: [id number]     HPI Brett Franklin is here for follow up of his chronic medical problems, including htn, afib, hypothyroid, DM, anxiety, hld, neck pain  He fell.   He walks twice a day - walks with a stick.  Has poor balance.  Declines PT.    Medications and allergies reviewed with patient and updated if appropriate.  Current Outpatient Medications on File Prior to Visit  Medication Sig Dispense Refill   apixaban (ELIQUIS) 5 MG TABS tablet TAKE 1 TABLET(5 MG) BY MOUTH TWICE DAILY 180 tablet 3   blood glucose meter kit and supplies KIT Dispense based on patient and insurance preference. Use to text blood sugars daily. 1 each 0   diltiazem (CARDIZEM CD) 120 MG 24 hr capsule TAKE 1 CAPSULE(120 MG) BY MOUTH DAILY. 30 capsule 2   diltiazem (CARDIZEM) 30 MG tablet Take 1 tablet (30 mg total) by mouth every 6 (six) hours as needed. 30 tablet 1   glucose blood test strip Use to test blood sugars once daily. Dx code-E11.49 100 each 3   JANUMET 50-500 MG tablet TAKE 1 TABLET BY MOUTH DAILY 90 tablet 1   Lancets (FREESTYLE) lancets Use to check blood sugars daily. 100 each 1   levothyroxine (SYNTHROID) 50 MCG tablet TAKE 1 TABLET(50 MCG) BY MOUTH DAILY 90 tablet 3   metoprolol tartrate (LOPRESSOR) 25 MG tablet Take 25 mg by mouth 2 (two) times daily.     mometasone (NASONEX) 50 MCG/ACT nasal spray SMARTSIG:2 Spray(s) Both Nares Every Night     MULTAQ 400 MG tablet TAKE 1 TABLET(400 MG) BY MOUTH TWICE DAILY WITH A MEAL 60 tablet 5   mupirocin ointment (BACTROBAN) 2 % Apply to wound after soaking BID 30 g 1   polyethylene glycol (MIRALAX / GLYCOLAX) 17 g packet Take 17 g by mouth daily.     pravastatin (PRAVACHOL) 20 MG tablet TAKE 1 TABLET(20 MG) BY MOUTH DAILY 90 tablet 1   tretinoin (RETIN-A) 0.05 % cream Apply 1 application. topically at bedtime.     VIAGRA 100 MG tablet      No current facility-administered  medications on file prior to visit.     Review of Systems  Constitutional:  Negative for chills and fever.  Respiratory:  Negative for cough, shortness of breath and wheezing.   Cardiovascular:  Negative for chest pain, palpitations and leg swelling.  Neurological:  Negative for dizziness, light-headedness and headaches.       Objective:   Vitals:   08/27/22 0749  BP: 120/76  Pulse: 80  Temp: 98 F (36.7 C)  SpO2: 98%   BP Readings from Last 3 Encounters:  08/27/22 120/76  06/23/22 (!) 153/60  05/23/22 128/80   Wt Readings from Last 3 Encounters:  08/27/22 160 lb (72.6 kg)  06/23/22 150 lb (68 kg)  05/23/22 157 lb (71.2 kg)   Body mass index is 22.96 kg/m.    Physical Exam Constitutional:      General: He is not in acute distress.    Appearance: Normal appearance. He is not ill-appearing.  HENT:     Head: Normocephalic and atraumatic.  Eyes:     Conjunctiva/sclera: Conjunctivae normal.  Cardiovascular:     Rate and Rhythm: Normal rate and regular rhythm.     Heart sounds: Normal heart sounds. No murmur heard. Pulmonary:     Effort:  Pulmonary effort is normal. No respiratory distress.     Breath sounds: Normal breath sounds. No wheezing or rales.  Musculoskeletal:     Right lower leg: No edema.     Left lower leg: No edema.  Skin:    General: Skin is warm and dry.     Findings: No rash.  Neurological:     Mental Status: He is alert. Mental status is at baseline.  Psychiatric:        Mood and Affect: Mood normal.        Lab Results  Component Value Date   WBC 9.9 05/23/2022   HGB 13.5 05/23/2022   HCT 39.9 05/23/2022   PLT 168.0 05/23/2022   GLUCOSE 104 (H) 05/23/2022   CHOL 117 02/20/2022   TRIG 32.0 02/20/2022   HDL 70.30 02/20/2022   LDLCALC 40 02/20/2022   ALT 18 05/23/2022   AST 18 05/23/2022   NA 139 05/23/2022   K 4.2 05/23/2022   CL 102 05/23/2022   CREATININE 1.26 05/23/2022   BUN 27 (H) 05/23/2022   CO2 25 05/23/2022   TSH  2.56 02/20/2022   INR 0.99 12/21/2009   HGBA1C 6.4 05/23/2022   MICROALBUR 2.8 (H) 11/24/2019     Assessment & Plan:    See Problem List for Assessment and Plan of chronic medical problems.

## 2022-08-26 ENCOUNTER — Encounter: Payer: Self-pay | Admitting: Internal Medicine

## 2022-08-27 ENCOUNTER — Ambulatory Visit (INDEPENDENT_AMBULATORY_CARE_PROVIDER_SITE_OTHER): Payer: Medicare Other | Admitting: Internal Medicine

## 2022-08-27 VITALS — BP 120/76 | HR 80 | Temp 98.0°F | Ht 70.0 in | Wt 160.0 lb

## 2022-08-27 DIAGNOSIS — E1149 Type 2 diabetes mellitus with other diabetic neurological complication: Secondary | ICD-10-CM

## 2022-08-27 DIAGNOSIS — I10 Essential (primary) hypertension: Secondary | ICD-10-CM | POA: Diagnosis not present

## 2022-08-27 DIAGNOSIS — Z23 Encounter for immunization: Secondary | ICD-10-CM

## 2022-08-27 DIAGNOSIS — R2689 Other abnormalities of gait and mobility: Secondary | ICD-10-CM

## 2022-08-27 DIAGNOSIS — E039 Hypothyroidism, unspecified: Secondary | ICD-10-CM

## 2022-08-27 DIAGNOSIS — E7849 Other hyperlipidemia: Secondary | ICD-10-CM

## 2022-08-27 DIAGNOSIS — M542 Cervicalgia: Secondary | ICD-10-CM

## 2022-08-27 DIAGNOSIS — I480 Paroxysmal atrial fibrillation: Secondary | ICD-10-CM

## 2022-08-27 DIAGNOSIS — F419 Anxiety disorder, unspecified: Secondary | ICD-10-CM

## 2022-08-27 MED ORDER — ACETAMINOPHEN-CODEINE 300-30 MG PO TABS: 1.0000 | ORAL_TABLET | Freq: Three times a day (TID) | ORAL | 0 refills | Status: DC | PRN

## 2022-08-27 MED ORDER — LORAZEPAM 0.5 MG PO TABS: ORAL_TABLET | ORAL | 2 refills | Status: DC

## 2022-08-27 NOTE — Assessment & Plan Note (Signed)
Chronic Controlled, Stable Continue lorazepam 0.5 mg daily as needed

## 2022-08-27 NOTE — Assessment & Plan Note (Signed)
Chronic Blood pressure well controlled Continue diltiazem 120 mg daily metoprolol 25 mg twice daily

## 2022-08-27 NOTE — Assessment & Plan Note (Signed)
Chronic Asymptomatic Following with cardiology Paroxysmal A-fib On Eliquis 5 mg twice daily, Multaq 400 mg twice daily, metoprolol 25 mg twice daily and diltiazem 120 mg daily

## 2022-08-27 NOTE — Assessment & Plan Note (Addendum)
Chronic Poor balance He is doing some walking-uses a stick-refuses to use a cane or walker Denies falls Recommended physical therapy - he deferred

## 2022-08-27 NOTE — Assessment & Plan Note (Signed)
Chronic   Lab Results  Component Value Date   HGBA1C 6.4 05/23/2022   Sugars well controlled Continue Janumet 50-500 mg daily Stressed regular exercise, diabetic diet

## 2022-08-27 NOTE — Assessment & Plan Note (Signed)
Chronic Euthyroid Last TSH in normal range Continue levothyroxine 50 mcg daily

## 2022-08-27 NOTE — Assessment & Plan Note (Signed)
Chronic Intermittent Taking Tylenol No. 3 as needed, which she does not take often Okay to continue Tylenol #3-1 tablet every 8 hours as needed

## 2022-08-27 NOTE — Assessment & Plan Note (Signed)
Chronic Continue pravastatin 20 mg daily

## 2022-08-27 NOTE — Addendum Note (Signed)
Addended by: Marcina Millard on: 08/27/2022 01:07 PM   Modules accepted: Orders

## 2022-09-04 ENCOUNTER — Encounter: Payer: Self-pay | Admitting: Internal Medicine

## 2022-09-04 NOTE — Progress Notes (Signed)
Outside notes received. Information abstracted. Notes sent to scan.  

## 2022-10-06 ENCOUNTER — Telehealth: Payer: Self-pay | Admitting: Internal Medicine

## 2022-10-06 DIAGNOSIS — M542 Cervicalgia: Secondary | ICD-10-CM

## 2022-10-06 NOTE — Telephone Encounter (Signed)
Referral ordered

## 2022-10-06 NOTE — Telephone Encounter (Signed)
Patient would like a referral to [facility] - patient says this was discussed at last visit.  Please advise - for rehab  Patient:  [phone]

## 2022-10-08 NOTE — Telephone Encounter (Signed)
Patient wife called, said appointment was made with the doctor side at ortho and they were looking just for the therapy on the sports medicine side. Would like to know if they can get a referral for just the therapy. Best callback number is [phone].

## 2022-10-09 NOTE — Telephone Encounter (Signed)
Referral ordered for physical therapy at [facility].  They can cancel the appointment with the orthopedic if they want.

## 2022-10-09 NOTE — Addendum Note (Signed)
Addended by: Binnie Rail on: 10/09/2022 12:08 PM   Modules accepted: Orders

## 2022-10-10 ENCOUNTER — Telehealth: Payer: Self-pay | Admitting: Internal Medicine

## 2022-10-10 NOTE — Telephone Encounter (Signed)
Patients wife called and she would like her husband referred [facility] on [address] - on hte physical theraphy sidel

## 2022-10-13 NOTE — Telephone Encounter (Signed)
Referral faxed on Friday

## 2022-10-14 ENCOUNTER — Other Ambulatory Visit: Payer: Self-pay | Admitting: Cardiology

## 2022-10-21 ENCOUNTER — Encounter: Payer: Self-pay | Admitting: Cardiology

## 2022-10-21 ENCOUNTER — Ambulatory Visit: Payer: Medicare Other | Attending: Cardiology | Admitting: Cardiology

## 2022-10-21 VITALS — BP 130/54 | HR 59 | Ht 70.0 in | Wt 152.4 lb

## 2022-10-21 DIAGNOSIS — I4819 Other persistent atrial fibrillation: Secondary | ICD-10-CM

## 2022-10-21 DIAGNOSIS — D6869 Other thrombophilia: Secondary | ICD-10-CM | POA: Diagnosis not present

## 2022-10-21 DIAGNOSIS — I10 Essential (primary) hypertension: Secondary | ICD-10-CM | POA: Diagnosis not present

## 2022-10-21 MED ORDER — DILTIAZEM HCL ER COATED BEADS 120 MG PO CP24: 120.0000 mg | ORAL_CAPSULE | Freq: Every day | ORAL | 3 refills | Status: DC

## 2022-10-21 NOTE — Progress Notes (Signed)
Electrophysiology Office Note   Date:  10/21/2022   ID:  Brett Franklin, DOB [birth year], MRN [id number]  PCP:  Binnie Rail, MD  Cardiologist:   Primary Electrophysiologist:  Brett Agrusa Meredith Leeds, MD    No chief complaint on file.    History of Present Illness: Brett Franklin is a 89 y.o. male who is being seen today for the evaluation of AF/flutter at the request of Brett Franklin, Claudina Lick, MD. Presenting today for electrophysiology evaluation.  He has a history significant for hypertension.  He presented to his primary physician's office with rapid heart rates.  EKG showed atrial fibrillation.  He had weakness and fatigue at that time.  His metoprolol dose was increased.  He had continued palpitations and had a cardioversion 04/13/2019.  He then presented to the emergency room 06/01/2019 with atrial fibrillation converted with IV metoprolol to sinus rhythm.  He was started on Multaq.  Today, denies symptoms of palpitations, chest pain, shortness of breath, orthopnea, PND, lower extremity edema, claudication, dizziness, presyncope, syncope, bleeding, or neurologic sequela. The patient is tolerating medications without difficulties.  He has been feeling well.  He has no chest pain or shortness of breath.  Is able to all of his daily activities without restriction.    Past Medical History:  Diagnosis Date   Adenomatous polyp of colon 11/1996   Dr Fuller Plan   Anxiety    BPH (benign prostatic hyperplasia)    Cervical spinal stenosis    CVA (cerebral vascular accident) ([place])    Diabetes mellitus, type 2 (HCC)    GERD (gastroesophageal reflux disease)    Hearing loss    Hemorrhoids    Hypertension    IBS (irritable bowel syndrome)    Melanoma ([place])    Nephrolithiasis     X 4   Past Surgical History:  Procedure Laterality Date   CARDIOVERSION N/A 04/13/2019   Procedure: CARDIOVERSION;  Surgeon: Buford Dresser, MD;  Location: [facility] ENDOSCOPY;  Service: Cardiovascular;  Laterality: N/A;    CATARACT EXTRACTION Left    CHOLECYSTECTOMY     COLONOSCOPY W/ POLYPECTOMY     Dr.Stark   CYSTOSCOPY     Dr.Kimbrough      Current Outpatient Medications  Medication Sig Dispense Refill   apixaban (ELIQUIS) 5 MG TABS tablet TAKE 1 TABLET(5 MG) BY MOUTH TWICE DAILY 180 tablet 3   blood glucose meter kit and supplies KIT Dispense based on patient and insurance preference. Use to text blood sugars daily. 1 each 0   diltiazem (CARDIZEM) 30 MG tablet Take 1 tablet (30 mg total) by mouth every 6 (six) hours as needed. 30 tablet 1   glucose blood test strip Use to test blood sugars once daily. Dx code-E11.49 100 each 3   JANUMET 50-500 MG tablet TAKE 1 TABLET BY MOUTH DAILY 90 tablet 1   Lancets (FREESTYLE) lancets Use to check blood sugars daily. 100 each 1   levothyroxine (SYNTHROID) 50 MCG tablet TAKE 1 TABLET(50 MCG) BY MOUTH DAILY 90 tablet 3   LORazepam (ATIVAN) 0.5 MG tablet TAKE 1 TABLET(0.5 MG) BY MOUTH EVERY 12 HOURS AS NEEDED FOR ANXIETY 20 tablet 2   metoprolol tartrate (LOPRESSOR) 25 MG tablet Take 25 mg by mouth 2 (two) times daily.     mometasone (NASONEX) 50 MCG/ACT nasal spray SMARTSIG:2 Spray(s) Both Nares Every Night     MULTAQ 400 MG tablet TAKE 1 TABLET(400 MG) BY MOUTH TWICE DAILY WITH A MEAL 60 tablet 5   mupirocin  ointment (BACTROBAN) 2 % Apply to wound after soaking BID 30 g 1   polyethylene glycol (MIRALAX / GLYCOLAX) 17 g packet Take 17 g by mouth daily.     pravastatin (PRAVACHOL) 20 MG tablet TAKE 1 TABLET(20 MG) BY MOUTH DAILY 90 tablet 1   diltiazem (CARDIZEM CD) 120 MG 24 hr capsule Take 1 capsule (120 mg total) by mouth daily. 90 capsule 3   VIAGRA 100 MG tablet  (Patient not taking: Reported on 10/21/2022)     No current facility-administered medications for this visit.    Allergies:   Lisinopril   Social History:  The patient  reports that he has never smoked. He has never used smokeless tobacco. He reports that he does not drink alcohol and does not use  drugs.   Family History:  The patient's family history includes Alcohol abuse in his father; Diabetes type I in an other family member; Heart attack (age of onset: 55) in his maternal grandfather; Heart disease in his father.   ROS:  Please see the history of present illness.   Otherwise, review of systems is positive for none.   All other systems are reviewed and negative.   PHYSICAL EXAM: VS:  BP (!) 130/54   Pulse (!) 59   Ht '5\' 10"'$  (1.778 m)   Wt 152 lb 6.4 oz (69.1 kg)   SpO2 97%   BMI 21.87 kg/m  , BMI Body mass index is 21.87 kg/m. GEN: Well nourished, well developed, in no acute distress  HEENT: normal  Neck: no JVD, carotid bruits, or masses Cardiac: RRR; no murmurs, rubs, or gallops,no edema  Respiratory:  clear to auscultation bilaterally, normal work of breathing GI: soft, nontender, nondistended, + BS MS: no deformity or atrophy  Skin: warm and dry Neuro:  Strength and sensation are intact Psych: euthymic mood, full affect  EKG:  EKG is ordered today. Personal review of the ekg ordered shows sinus rhythm   Recent Labs: 02/20/2022: TSH 2.56 05/23/2022: ALT 18; BUN 27; Creatinine, Ser 1.26; Hemoglobin 13.5; Platelets 168.0; Potassium 4.2; Sodium 139    Lipid Panel     Component Value Date/Time   CHOL 117 02/20/2022 0817   TRIG 32.0 02/20/2022 0817   TRIG 36 09/04/2006 0915   HDL 70.30 02/20/2022 0817   CHOLHDL 2 02/20/2022 0817   VLDL 6.4 02/20/2022 0817   LDLCALC 40 02/20/2022 0817   LDLCALC 32 05/17/2020 0901     Wt Readings from Last 3 Encounters:  10/21/22 152 lb 6.4 oz (69.1 kg)  08/27/22 160 lb (72.6 kg)  06/23/22 150 lb (68 kg)      Other studies Reviewed: Additional studies/ records that were reviewed today include: PCP records  ASSESSMENT AND PLAN:  1.  Persistent atrial fibrillation/flutter: Currently on Multaq and Eliquis, dose as above.  CHA2DS2-VASc of 6.  Mains in sinus rhythm.  No changes.  2.  Hypertension: Currently  well-controlled  3.  Secondary hypercoagulable state: Currently on Eliquis for atrial fibrillation as above   Current medicines are reviewed at length with the patient today.   The patient does not have concerns regarding his medicines.  The following changes were made today: None  Labs/ tests ordered today include:  Orders Placed This Encounter  Procedures   EKG 12-Lead     Disposition:   FU 6 months  Signed, Nahome Bublitz Meredith Leeds, MD  10/21/2022 12:29 PM     [facility] [address] [place] [address] [phone] (  office) [phone] (fax)

## 2022-10-21 NOTE — Patient Instructions (Signed)
Medication Instructions:  Your physician recommends that you continue on your current medications as directed. Please refer to the Current Medication list given to you today.  *If you need a refill on your cardiac medications before your next appointment, please call your pharmacy*   Lab Work: None ordered   Testing/Procedures: None ordered   Follow-Up: At [facility], you and your health needs are our priority.  As part of our continuing mission to provide you with exceptional heart care, we have created designated Provider Care Teams.  These Care Teams include your primary Cardiologist (physician) and Advanced Practice Providers (APPs -  Physician Assistants and Nurse Practitioners) who all work together to provide you with the care you need, when you need it.  We recommend signing up for the patient portal called "MyChart".  Sign up information is provided on this After Visit Summary.  MyChart is used to connect with patients for Virtual Visits (Telemedicine).  Patients are able to view lab/test results, encounter notes, upcoming appointments, etc.  Non-urgent messages can be sent to your provider as well.   To learn more about what you can do with MyChart, go to [email or web address].    Your next appointment:   6 month(s)  The format for your next appointment:   In Person  Provider:   You will see one of the following Advanced Practice Providers on your designated Care Team:   Tommye Standard, [place] Legrand Como "Jonni Sanger" Chalmers Cater, [place]    Thank you for choosing [facility]!!   Trinidad Curet, RN [phone]  Other Instructions

## 2022-11-04 ENCOUNTER — Ambulatory Visit (INDEPENDENT_AMBULATORY_CARE_PROVIDER_SITE_OTHER): Payer: Medicare Other | Admitting: Podiatry

## 2022-11-04 ENCOUNTER — Ambulatory Visit: Payer: Medicare Other | Admitting: Podiatry

## 2022-11-04 VITALS — BP 128/78

## 2022-11-04 DIAGNOSIS — E1142 Type 2 diabetes mellitus with diabetic polyneuropathy: Secondary | ICD-10-CM

## 2022-11-04 DIAGNOSIS — M79676 Pain in unspecified toe(s): Secondary | ICD-10-CM

## 2022-11-04 DIAGNOSIS — B351 Tinea unguium: Secondary | ICD-10-CM | POA: Diagnosis not present

## 2022-11-04 NOTE — Progress Notes (Signed)
He presents today chief complaint of painful elongated toenails and calluses plantar aspect left foot primarily.  Objective: Vital signs stable alert oriented x3 pulses are palpable.  Toenails are long thick yellow dystrophic onychomycotic painful palpation.  He has a reactive hyperkeratotic plantar aspect the Sub fifth metatarsal head of the left foot none open none ulcerative.  Assessment: Pain in limb secondary to onychomycosis and tinea pedis as well as benign skin lesion.  Plan: Debrided benign skin lesion debrided nails 1 through 5 bilaterally and recommended over-the-counter antifungal.

## 2022-11-13 ENCOUNTER — Other Ambulatory Visit: Payer: Self-pay | Admitting: Internal Medicine

## 2022-11-13 ENCOUNTER — Other Ambulatory Visit: Payer: Self-pay

## 2022-11-13 LAB — HM DIABETES EYE EXAM

## 2022-11-25 ENCOUNTER — Encounter: Payer: Self-pay | Admitting: Internal Medicine

## 2022-11-25 NOTE — Progress Notes (Unsigned)
Subjective:    Patient ID: Brett Franklin, male    DOB: Nov 02, 1925, 89 y.o.   MRN: [id number]     HPI Brett Franklin is here for follow up of his chronic medical problems, including htn, afib, hypothyroid, DM, anxiety, hld, neck pain  Still doing some walking.  Doing PT now which is helping with his balance.  Had one episode of tachycardia of 114 - he did feel tired - he did take the as needed cardizem and it helped - took a couple of hours to bring the HR down.    Medications and allergies reviewed with patient and updated if appropriate.  Current Outpatient Medications on File Prior to Visit  Medication Sig Dispense Refill   blood glucose meter kit and supplies KIT Dispense based on patient and insurance preference. Use to text blood sugars daily. 1 each 0   diltiazem (CARDIZEM CD) 120 MG 24 hr capsule Take 1 capsule (120 mg total) by mouth daily. 90 capsule 3   diltiazem (CARDIZEM) 30 MG tablet Take 1 tablet (30 mg total) by mouth every 6 (six) hours as needed. 30 tablet 1   ELIQUIS 5 MG TABS tablet TAKE 1 TABLET(5 MG) BY MOUTH TWICE DAILY 180 tablet 3   glucose blood test strip Use to test blood sugars once daily. Dx code-E11.49 100 each 3   JANUMET 50-500 MG tablet TAKE 1 TABLET BY MOUTH DAILY 90 tablet 1   Lancets (FREESTYLE) lancets Use to check blood sugars daily. 100 each 1   levothyroxine (SYNTHROID) 50 MCG tablet TAKE 1 TABLET(50 MCG) BY MOUTH DAILY 90 tablet 3   LORazepam (ATIVAN) 0.5 MG tablet TAKE 1 TABLET(0.5 MG) BY MOUTH EVERY 12 HOURS AS NEEDED FOR ANXIETY 20 tablet 2   metoprolol tartrate (LOPRESSOR) 25 MG tablet Take 25 mg by mouth 2 (two) times daily.     mometasone (NASONEX) 50 MCG/ACT nasal spray SMARTSIG:2 Spray(s) Both Nares Every Night     MULTAQ 400 MG tablet TAKE 1 TABLET(400 MG) BY MOUTH TWICE DAILY WITH A MEAL 60 tablet 5   mupirocin ointment (BACTROBAN) 2 % Apply to wound after soaking BID 30 g 1   polyethylene glycol (MIRALAX / GLYCOLAX) 17 g packet Take  17 g by mouth daily.     pravastatin (PRAVACHOL) 20 MG tablet TAKE 1 TABLET(20 MG) BY MOUTH DAILY 90 tablet 1   No current facility-administered medications on file prior to visit.     Review of Systems  Constitutional:  Negative for fever.  Respiratory:  Negative for cough, shortness of breath and wheezing.   Cardiovascular:  Positive for palpitations (one episode recently). Negative for chest pain and leg swelling.  Neurological:  Negative for light-headedness and headaches.       Objective:   Vitals:   11/26/22 0750  BP: 126/64  Pulse: 70  Temp: 98 F (36.7 C)  SpO2: 98%   BP Readings from Last 3 Encounters:  11/26/22 126/64  11/04/22 128/78  10/21/22 (!) 130/54   Wt Readings from Last 3 Encounters:  11/26/22 156 lb (70.8 kg)  10/21/22 152 lb 6.4 oz (69.1 kg)  08/27/22 160 lb (72.6 kg)   Body mass index is 22.38 kg/m.    Physical Exam Constitutional:      General: He is not in acute distress.    Appearance: Normal appearance. He is not ill-appearing.  HENT:     Head: Normocephalic and atraumatic.  Eyes:     Conjunctiva/sclera: Conjunctivae normal.  Cardiovascular:     Rate and Rhythm: Normal rate and regular rhythm.     Heart sounds: Normal heart sounds.  Pulmonary:     Effort: Pulmonary effort is normal. No respiratory distress.     Breath sounds: Normal breath sounds. No wheezing or rales.  Musculoskeletal:     Right lower leg: No edema.     Left lower leg: No edema.  Skin:    General: Skin is warm and dry.     Findings: No rash.  Neurological:     Mental Status: He is alert. Mental status is at baseline.  Psychiatric:        Mood and Affect: Mood normal.        Lab Results  Component Value Date   WBC 9.9 05/23/2022   HGB 13.5 05/23/2022   HCT 39.9 05/23/2022   PLT 168.0 05/23/2022   GLUCOSE 104 (H) 05/23/2022   CHOL 117 02/20/2022   TRIG 32.0 02/20/2022   HDL 70.30 02/20/2022   LDLCALC 40 02/20/2022   ALT 18 05/23/2022   AST 18  05/23/2022   NA 139 05/23/2022   K 4.2 05/23/2022   CL 102 05/23/2022   CREATININE 1.26 05/23/2022   BUN 27 (H) 05/23/2022   CO2 25 05/23/2022   TSH 2.56 02/20/2022   INR 0.99 12/21/2009   HGBA1C 6.4 05/23/2022   MICROALBUR 2.8 (H) 11/24/2019     Assessment & Plan:    See Problem List for Assessment and Plan of chronic medical problems.

## 2022-11-25 NOTE — Patient Instructions (Addendum)
      Blood work was ordered.   The lab is on the first floor.    Medications changes include :   none     Return in about 3 months (around 02/24/2023) for Physical Exam.

## 2022-11-26 ENCOUNTER — Ambulatory Visit (INDEPENDENT_AMBULATORY_CARE_PROVIDER_SITE_OTHER): Payer: Medicare Other | Admitting: Internal Medicine

## 2022-11-26 VITALS — BP 126/64 | HR 70 | Temp 98.0°F | Ht 70.0 in | Wt 156.0 lb

## 2022-11-26 DIAGNOSIS — E1149 Type 2 diabetes mellitus with other diabetic neurological complication: Secondary | ICD-10-CM | POA: Diagnosis not present

## 2022-11-26 DIAGNOSIS — M542 Cervicalgia: Secondary | ICD-10-CM

## 2022-11-26 DIAGNOSIS — I480 Paroxysmal atrial fibrillation: Secondary | ICD-10-CM

## 2022-11-26 DIAGNOSIS — E7849 Other hyperlipidemia: Secondary | ICD-10-CM

## 2022-11-26 DIAGNOSIS — E039 Hypothyroidism, unspecified: Secondary | ICD-10-CM

## 2022-11-26 DIAGNOSIS — F419 Anxiety disorder, unspecified: Secondary | ICD-10-CM

## 2022-11-26 DIAGNOSIS — I10 Essential (primary) hypertension: Secondary | ICD-10-CM | POA: Diagnosis not present

## 2022-11-26 LAB — CBC WITH DIFFERENTIAL/PLATELET
Basophils Absolute: 0 10*3/uL (ref 0.0–0.1)
Basophils Relative: 0.3 % (ref 0.0–3.0)
Eosinophils Absolute: 0.1 10*3/uL (ref 0.0–0.7)
Eosinophils Relative: 0.7 % (ref 0.0–5.0)
HCT: 39.5 % (ref 39.0–52.0)
Hemoglobin: 13.5 g/dL (ref 13.0–17.0)
Lymphocytes Relative: 17.9 % (ref 12.0–46.0)
Lymphs Abs: 1.5 10*3/uL (ref 0.7–4.0)
MCHC: 34.2 g/dL (ref 30.0–36.0)
MCV: 96.1 fl (ref 78.0–100.0)
Monocytes Absolute: 0.8 10*3/uL (ref 0.1–1.0)
Monocytes Relative: 9.1 % (ref 3.0–12.0)
Neutro Abs: 6 10*3/uL (ref 1.4–7.7)
Neutrophils Relative %: 72 % (ref 43.0–77.0)
Platelets: 215 10*3/uL (ref 150.0–400.0)
RBC: 4.1 Mil/uL — ABNORMAL LOW (ref 4.22–5.81)
RDW: 15 % (ref 11.5–15.5)
WBC: 8.3 10*3/uL (ref 4.0–10.5)

## 2022-11-26 LAB — COMPREHENSIVE METABOLIC PANEL
ALT: 17 U/L (ref 0–53)
AST: 18 U/L (ref 0–37)
Albumin: 3.8 g/dL (ref 3.5–5.2)
Alkaline Phosphatase: 63 U/L (ref 39–117)
BUN: 26 mg/dL — ABNORMAL HIGH (ref 6–23)
CO2: 26 mEq/L (ref 19–32)
Calcium: 9.4 mg/dL (ref 8.4–10.5)
Chloride: 103 mEq/L (ref 96–112)
Creatinine, Ser: 1.2 mg/dL (ref 0.40–1.50)
GFR: 51.59 mL/min — ABNORMAL LOW (ref >60.00)
Glucose, Bld: 114 mg/dL — ABNORMAL HIGH (ref 70–99)
Potassium: 4.2 mEq/L (ref 3.5–5.1)
Sodium: 138 mEq/L (ref 135–145)
Total Bilirubin: 0.5 mg/dL (ref 0.2–1.2)
Total Protein: 6.8 g/dL (ref 6.0–8.3)

## 2022-11-26 LAB — LIPID PANEL
Cholesterol: 100 mg/dL (ref 0–200)
HDL: 58 mg/dL (ref >39.00)
LDL Cholesterol: 33 mg/dL (ref 0–99)
NonHDL: 41.66
Total CHOL/HDL Ratio: 2
Triglycerides: 41 mg/dL (ref 0.0–149.0)
VLDL: 8.2 mg/dL (ref 0.0–40.0)

## 2022-11-26 LAB — TSH: TSH: 4.93 u[IU]/mL (ref 0.35–5.50)

## 2022-11-26 LAB — HEMOGLOBIN A1C: Hgb A1c MFr Bld: 6.4 % (ref 4.6–6.5)

## 2022-11-26 NOTE — Assessment & Plan Note (Addendum)
Chronic Had had one episode of elevated HR - felt fatigued - likely afib - took cardizem 30 mg - his prn dose -- took a couple of hours for HR to improve - no other episodes Follows with cardiology On Eliquis 5 mg twice daily, Multaq 400 mg twice daily, diltiazem 120 mg daily, metoprolol 25 mg daily CBC, CMP

## 2022-11-26 NOTE — Assessment & Plan Note (Signed)
Chronic  Clinically euthyroid Check tsh and will titrate med dose if needed Currently taking levothyroxine 50 mcg daily

## 2022-11-26 NOTE — Assessment & Plan Note (Signed)
Chronic   Lab Results  Component Value Date   HGBA1C 6.4 05/23/2022   Sugars well controlled Check A1c Continue Janumet 50-500 mg daily Stressed regular exercise, diabetic diet

## 2022-11-26 NOTE — Assessment & Plan Note (Signed)
Chronic Intermittent Continue Tylenol 3-1 tablet every 8 hours as needed-he does not take often, but I will refill if needed

## 2022-11-26 NOTE — Assessment & Plan Note (Signed)
Chronic Blood pressure well controlled CMP Continue diltiazem 120 mg daily, metoprolol 25 mg twice daily

## 2022-11-26 NOTE — Assessment & Plan Note (Signed)
Chronic Controlled, Stable Continue lorazepam 0.5 mg twice daily as needed

## 2022-11-26 NOTE — Assessment & Plan Note (Signed)
Chronic Regular exercise and healthy diet encouraged Check lipid panel  Continue pravastatin 20 mg daily

## 2022-11-28 ENCOUNTER — Telehealth: Payer: Self-pay | Admitting: Internal Medicine

## 2022-11-28 ENCOUNTER — Encounter: Payer: Self-pay | Admitting: Internal Medicine

## 2022-11-28 DIAGNOSIS — G8929 Other chronic pain: Secondary | ICD-10-CM

## 2022-11-28 NOTE — Telephone Encounter (Signed)
Patient's PT would like to continue physical therapy. Patient spoke with Dr. Quay Burow on 11/26/22 and she said she would get that sent in. Best callback number is [phone]

## 2022-11-28 NOTE — Progress Notes (Signed)
Outside notes received. Information abstracted. Notes sent to scan.  

## 2022-11-30 ENCOUNTER — Other Ambulatory Visit: Payer: Self-pay | Admitting: Internal Medicine

## 2022-12-01 NOTE — Telephone Encounter (Signed)
Referral ordered.  Did the same order as last time.

## 2022-12-02 NOTE — Telephone Encounter (Signed)
Called Pt and let him know referral has been sent.

## 2022-12-29 ENCOUNTER — Other Ambulatory Visit: Payer: Self-pay | Admitting: Internal Medicine

## 2023-01-05 ENCOUNTER — Ambulatory Visit (INDEPENDENT_AMBULATORY_CARE_PROVIDER_SITE_OTHER): Payer: Medicare Other | Admitting: Podiatry

## 2023-01-05 ENCOUNTER — Encounter: Payer: Self-pay | Admitting: Podiatry

## 2023-01-05 VITALS — Ht 67.0 in | Wt 156.0 lb

## 2023-01-05 DIAGNOSIS — D689 Coagulation defect, unspecified: Secondary | ICD-10-CM

## 2023-01-05 DIAGNOSIS — D2371 Other benign neoplasm of skin of right lower limb, including hip: Secondary | ICD-10-CM | POA: Diagnosis not present

## 2023-01-05 DIAGNOSIS — M79676 Pain in unspecified toe(s): Secondary | ICD-10-CM | POA: Diagnosis not present

## 2023-01-05 DIAGNOSIS — B351 Tinea unguium: Secondary | ICD-10-CM

## 2023-01-05 DIAGNOSIS — M7752 Other enthesopathy of left foot: Secondary | ICD-10-CM | POA: Diagnosis not present

## 2023-01-05 DIAGNOSIS — D2372 Other benign neoplasm of skin of left lower limb, including hip: Secondary | ICD-10-CM

## 2023-01-05 DIAGNOSIS — E1142 Type 2 diabetes mellitus with diabetic polyneuropathy: Secondary | ICD-10-CM

## 2023-01-05 MED ORDER — DEXAMETHASONE SODIUM PHOSPHATE 120 MG/30ML IJ SOLN
2.0000 mg | Freq: Once | INTRAMUSCULAR | Status: AC
  Administered 2023-01-05: 2 mg via INTRA_ARTICULAR

## 2023-01-05 NOTE — Progress Notes (Signed)
He presents today for a concern of his painful lesion to the plantar aspect of the fifth metatarsal head of the left foot.  He is also plain painful elongated toenails and painful corn to the third digit of the right foot.  Objective: Vital signs stable alert oriented x 3 pulses are palpable.  Digital deformities resulting in the distal clavus of the third digit the right foot with mild bleeding beneath it.  He also has a bursitis that at the fifth metatarsal head of the left foot consistent with benign skin lesion and bursitis and plantarflexed metatarsal.  Toenails are long thick yellow dystrophic onychomycotic no signs of bacterial infection.  Assessment pain in limb secondary to bursitis benign skin lesion and painful elongated toenails.  Plan: Discussed etiology pathology conservative surgical therapies I injected dexamethasone down to the bursitis of the fifth metatarsal head left foot.  I debrided the benign skin lesions on both feet and debrided toenails 1 through 5 bilateral.

## 2023-01-08 ENCOUNTER — Other Ambulatory Visit: Payer: Self-pay

## 2023-01-08 ENCOUNTER — Other Ambulatory Visit: Payer: Self-pay | Admitting: Internal Medicine

## 2023-02-03 ENCOUNTER — Ambulatory Visit: Payer: Medicare Other | Admitting: Podiatry

## 2023-02-07 ENCOUNTER — Other Ambulatory Visit: Payer: Self-pay | Admitting: Internal Medicine

## 2023-02-11 ENCOUNTER — Other Ambulatory Visit: Payer: Self-pay | Admitting: Cardiology

## 2023-02-17 LAB — HM DIABETES EYE EXAM

## 2023-02-25 ENCOUNTER — Encounter: Payer: Self-pay | Admitting: Internal Medicine

## 2023-02-25 NOTE — Patient Instructions (Addendum)
Medications changes include :   none   A referral was ordered for physical therapy at [facility] orthopedics.  They will call you.    Return in about 4 weeks (around 03/26/2023) for follow up.    Health Maintenance, Male Adopting a healthy lifestyle and getting preventive care are important in promoting health and wellness. Ask your health care provider about: The right schedule for you to have regular tests and exams. Things you can do on your own to prevent diseases and keep yourself healthy. What should I know about diet, weight, and exercise? Eat a healthy diet  Eat a diet that includes plenty of vegetables, fruits, low-fat dairy products, and lean protein. Do not eat a lot of foods that are high in solid fats, added sugars, or sodium. Maintain a healthy weight Body mass index (BMI) is a measurement that can be used to identify possible weight problems. It estimates body fat based on height and weight. Your health care provider can help determine your BMI and help you achieve or maintain a healthy weight. Get regular exercise Get regular exercise. This is one of the most important things you can do for your health. Most adults should: Exercise for at least 150 minutes each week. The exercise should increase your heart rate and make you sweat (moderate-intensity exercise). Do strengthening exercises at least twice a week. This is in addition to the moderate-intensity exercise. Spend less time sitting. Even light physical activity can be beneficial. Watch cholesterol and blood lipids Have your blood tested for lipids and cholesterol at 87 years of age, then have this test every 5 years. You may need to have your cholesterol levels checked more often if: Your lipid or cholesterol levels are high. You are older than 87 years of age. You are at high risk for heart disease. What should I know about cancer screening? Many types of cancers can be detected early and may often be  prevented. Depending on your health history and family history, you may need to have cancer screening at various ages. This may include screening for: Colorectal cancer. Prostate cancer. Skin cancer. Lung cancer. What should I know about heart disease, diabetes, and high blood pressure? Blood pressure and heart disease High blood pressure causes heart disease and increases the risk of stroke. This is more likely to develop in people who have high blood pressure readings or are overweight. Talk with your health care provider about your target blood pressure readings. Have your blood pressure checked: Every 3-5 years if you are 38-42 years of age. Every year if you are 27 years old or older. If you are between the ages of 21 and 41 and are a current or former smoker, ask your health care provider if you should have a one-time screening for abdominal aortic aneurysm (AAA). Diabetes Have regular diabetes screenings. This checks your fasting blood sugar level. Have the screening done: Once every three years after age 76 if you are at a normal weight and have a low risk for diabetes. More often and at a younger age if you are overweight or have a high risk for diabetes. What should I know about preventing infection? Hepatitis B If you have a higher risk for hepatitis B, you should be screened for this virus. Talk with your health care provider to find out if you are at risk for hepatitis B infection. Hepatitis C Blood testing is recommended for: Everyone born from [birth year] through 1963. Anyone with known  risk factors for hepatitis C. Sexually transmitted infections (STIs) You should be screened each year for STIs, including gonorrhea and chlamydia, if: You are sexually active and are younger than 87 years of age. You are older than 87 years of age and your health care provider tells you that you are at risk for this type of infection. Your sexual activity has changed since you were last screened,  and you are at increased risk for chlamydia or gonorrhea. Ask your health care provider if you are at risk. Ask your health care provider about whether you are at high risk for HIV. Your health care provider may recommend a prescription medicine to help prevent HIV infection. If you choose to take medicine to prevent HIV, you should first get tested for HIV. You should then be tested every 3 months for as long as you are taking the medicine. Follow these instructions at home: Alcohol use Do not drink alcohol if your health care provider tells you not to drink. If you drink alcohol: Limit how much you have to 0-2 drinks a day. Know how much alcohol is in your drink. In the [place] one drink equals one 12 oz bottle of beer (355 mL), one 5 oz glass of wine (148 mL), or one 1 oz glass of hard liquor (44 mL). Lifestyle Do not use any products that contain nicotine or tobacco. These products include cigarettes, chewing tobacco, and vaping devices, such as e-cigarettes. If you need help quitting, ask your health care provider. Do not use street drugs. Do not share needles. Ask your health care provider for help if you need support or information about quitting drugs. General instructions Schedule regular health, dental, and eye exams. Stay current with your vaccines. Tell your health care provider if: You often feel depressed. You have ever been abused or do not feel safe at home. Summary Adopting a healthy lifestyle and getting preventive care are important in promoting health and wellness. Follow your health care provider's instructions about healthy diet, exercising, and getting tested or screened for diseases. Follow your health care provider's instructions on monitoring your cholesterol and blood pressure. This information is not intended to replace advice given to you by your health care provider. Make sure you discuss any questions you have with your health care provider. Document Revised:  02/04/2021 Document Reviewed: 02/04/2021 Elsevier Patient Education  2024 [employer].

## 2023-02-25 NOTE — Progress Notes (Unsigned)
Subjective:    Patient ID: Brett Franklin, male    DOB: [birth year], 89 y.o.   MRN: [id number]     HPI Zyree is here for a physical exam and his chronic medical problems.   He has had a couple of episodes of coughing up blood- last episode was about 2-3 weeks ago.  It was the size of a 1/2 of a dime.  None since then.   When he eats he sometimes brings up mucus.  The mucus is clear.   No GERD.       Medications and allergies reviewed with patient and updated if appropriate.  Current Outpatient Medications on File Prior to Visit  Medication Sig Dispense Refill   blood glucose meter kit and supplies KIT Dispense based on patient and insurance preference. Use to text blood sugars daily. 1 each 0   diltiazem (CARDIZEM CD) 120 MG 24 hr capsule TAKE 1 CAPSULE(120 MG) BY MOUTH DAILY 90 capsule 1   diltiazem (CARDIZEM) 30 MG tablet TAKE 1 TABLET(30 MG) BY MOUTH EVERY 6 HOURS AS NEEDED 90 tablet 1   ELIQUIS 5 MG TABS tablet TAKE 1 TABLET(5 MG) BY MOUTH TWICE DAILY 180 tablet 3   glucose blood test strip Use to test blood sugars once daily. Dx code-E11.49 100 each 3   JANUMET 50-500 MG tablet TAKE 1 TABLET BY MOUTH DAILY 90 tablet 1   Lancets (FREESTYLE) lancets Use to check blood sugars daily. 100 each 1   levothyroxine (SYNTHROID) 50 MCG tablet TAKE 1 TABLET(50 MCG) BY MOUTH DAILY 90 tablet 3   LORazepam (ATIVAN) 0.5 MG tablet TAKE 1 TABLET(0.5 MG) BY MOUTH EVERY 12 HOURS AS NEEDED FOR ANXIETY 20 tablet 2   metoprolol tartrate (LOPRESSOR) 25 MG tablet Take 25 mg by mouth 2 (two) times daily.     mometasone (NASONEX) 50 MCG/ACT nasal spray SMARTSIG:2 Spray(s) Both Nares Every Night     MULTAQ 400 MG tablet TAKE 1 TABLET(400 MG) BY MOUTH TWICE DAILY WITH A MEAL 60 tablet 5   mupirocin ointment (BACTROBAN) 2 % Apply to wound after soaking BID 30 g 1   polyethylene glycol (MIRALAX / GLYCOLAX) 17 g packet Take 17 g by mouth daily.     pravastatin (PRAVACHOL) 20 MG tablet TAKE 1 TABLET(20  MG) BY MOUTH DAILY 90 tablet 1   No current facility-administered medications on file prior to visit.    Review of Systems  Constitutional:  Negative for fever.  HENT:  Positive for congestion (head feels stopped up). Negative for ear pain (clogged at times), postnasal drip, sore throat and trouble swallowing.   Eyes:  Negative for visual disturbance.  Respiratory:  Positive for cough (after he eats) and shortness of breath (at times). Negative for wheezing.   Cardiovascular:  Negative for chest pain, palpitations and leg swelling.  Gastrointestinal:  Negative for abdominal pain, blood in stool, constipation, diarrhea and nausea.       No gerd  Genitourinary:  Negative for dysuria.  Musculoskeletal:  Positive for arthralgias (mild), back pain (occ) and neck pain (occ).       Poor balance  Skin:  Negative for rash.  Neurological:  Negative for light-headedness and headaches.  Psychiatric/Behavioral:  Negative for dysphoric mood. The patient is not nervous/anxious.        Objective:   Vitals:   02/26/23 0745  BP: 120/80  Pulse: 69  Temp: 98 F (36.7 C)  SpO2: 96%   Filed Weights   02/26/23  0745  Weight: 154 lb (69.9 kg)   Body mass index is 24.12 kg/m.  BP Readings from Last 3 Encounters:  02/26/23 120/80  11/26/22 126/64  11/04/22 128/78    Wt Readings from Last 3 Encounters:  02/26/23 154 lb (69.9 kg)  01/05/23 156 lb (70.8 kg)  11/26/22 156 lb (70.8 kg)      Physical Exam Constitutional: He appears well-developed and well-nourished. No distress.  HENT:  Head: Normocephalic and atraumatic.  Right Ear: External ear normal.  Left Ear: External ear normal.  Normal ear canals and TM b/l  Mouth/Throat: Oropharynx is clear and moist. Eyes: Conjunctivae and EOM are normal.  Neck: Neck supple. No tracheal deviation present. No thyromegaly present.  No carotid bruit  Cardiovascular: Normal rate, regular rhythm, normal heart sounds and intact distal pulses.   No  murmur heard.  No lower extremity edema. Pulmonary/Chest: Effort normal and breath sounds normal. No respiratory distress. He has no wheezes. He has no rales.  Abdominal: Soft. He exhibits no distension. There is no tenderness.  Genitourinary: deferred  Lymphadenopathy:   He has no cervical adenopathy.  Skin: Skin is warm and dry. He is not diaphoretic.  Psychiatric: He has a normal mood and affect. His behavior is normal.         Assessment & Plan:   Physical exam: Screening blood work  ordered Exercise   walking Weight  normal Substance abuse   none   Reviewed recommended immunizations.   Health Maintenance  Topic Date Due   COVID-19 Vaccine (3 - Pfizer risk series) 12/07/2019   Medicare Annual Wellness (AWV)  02/13/2021   INFLUENZA VACCINE  04/30/2023   HEMOGLOBIN A1C  05/27/2023   FOOT EXAM  08/01/2023   OPHTHALMOLOGY EXAM  11/14/2023   DTaP/Tdap/Td (4 - Td or Tdap) 06/23/2032   Pneumonia Vaccine 25+ Years old  Completed   Zoster Vaccines- Shingrix  Completed   HPV VACCINES  Aged Out     See Problem List for Assessment and Plan of chronic medical problems.

## 2023-02-26 ENCOUNTER — Ambulatory Visit (INDEPENDENT_AMBULATORY_CARE_PROVIDER_SITE_OTHER): Payer: Medicare Other | Admitting: Internal Medicine

## 2023-02-26 VITALS — BP 120/80 | HR 69 | Temp 98.0°F | Ht 67.0 in | Wt 154.0 lb

## 2023-02-26 DIAGNOSIS — E039 Hypothyroidism, unspecified: Secondary | ICD-10-CM

## 2023-02-26 DIAGNOSIS — F419 Anxiety disorder, unspecified: Secondary | ICD-10-CM

## 2023-02-26 DIAGNOSIS — I10 Essential (primary) hypertension: Secondary | ICD-10-CM

## 2023-02-26 DIAGNOSIS — I480 Paroxysmal atrial fibrillation: Secondary | ICD-10-CM

## 2023-02-26 DIAGNOSIS — Z0000 Encounter for general adult medical examination without abnormal findings: Secondary | ICD-10-CM

## 2023-02-26 DIAGNOSIS — Z8673 Personal history of transient ischemic attack (TIA), and cerebral infarction without residual deficits: Secondary | ICD-10-CM

## 2023-02-26 DIAGNOSIS — E1149 Type 2 diabetes mellitus with other diabetic neurological complication: Secondary | ICD-10-CM | POA: Diagnosis not present

## 2023-02-26 DIAGNOSIS — Z7984 Long term (current) use of oral hypoglycemic drugs: Secondary | ICD-10-CM

## 2023-02-26 DIAGNOSIS — R5381 Other malaise: Secondary | ICD-10-CM

## 2023-02-26 DIAGNOSIS — R2689 Other abnormalities of gait and mobility: Secondary | ICD-10-CM

## 2023-02-26 DIAGNOSIS — E7849 Other hyperlipidemia: Secondary | ICD-10-CM

## 2023-02-26 NOTE — Assessment & Plan Note (Signed)
Chronic Follows with cardiology On Eliquis 5 mg twice daily, Multaq 400 mg twice daily, diltiazem 120 mg daily, metoprolol 25 mg daily Has diltiazem 30 mg prn for episodes of A-fib if needed

## 2023-02-26 NOTE — Assessment & Plan Note (Signed)
Chronic   Lab Results  Component Value Date   HGBA1C 6.4 11/26/2022   Sugars well controlled Continue Janumet 50-500 mg daily Stressed regular exercise, diabetic diet

## 2023-02-26 NOTE — Assessment & Plan Note (Signed)
Chronic  Clinically euthyroid Currently taking levothyroxine 50 mcg daily   

## 2023-02-26 NOTE — Assessment & Plan Note (Signed)
Chronic ?Blood pressure well controlled ?Continue diltiazem 120 mg daily, metoprolol 25 mg twice daily ?

## 2023-02-26 NOTE — Assessment & Plan Note (Signed)
History of stroke On Eliquis 5 mg twice daily, pravastatin 20 mg daily Blood pressure well controlled Sugars well controlled  

## 2023-02-26 NOTE — Assessment & Plan Note (Signed)
Chronic Regular exercise and healthy diet encouraged Continue pravastatin 20 mg daily 

## 2023-02-26 NOTE — Assessment & Plan Note (Signed)
Chronic Poor balance, physical deconditioning - abn gait Using cane Would benefit from additional PT - will refer

## 2023-02-26 NOTE — Assessment & Plan Note (Signed)
Chronic Controlled, Stable Continue lorazepam 0.5 mg twice daily as needed 

## 2023-03-03 ENCOUNTER — Ambulatory Visit (INDEPENDENT_AMBULATORY_CARE_PROVIDER_SITE_OTHER): Payer: Medicare Other | Admitting: Podiatry

## 2023-03-03 ENCOUNTER — Encounter: Payer: Self-pay | Admitting: Podiatry

## 2023-03-03 DIAGNOSIS — B351 Tinea unguium: Secondary | ICD-10-CM

## 2023-03-03 DIAGNOSIS — M79676 Pain in unspecified toe(s): Secondary | ICD-10-CM

## 2023-03-03 DIAGNOSIS — D2371 Other benign neoplasm of skin of right lower limb, including hip: Secondary | ICD-10-CM | POA: Diagnosis not present

## 2023-03-03 DIAGNOSIS — D2372 Other benign neoplasm of skin of left lower limb, including hip: Secondary | ICD-10-CM

## 2023-03-03 DIAGNOSIS — E1142 Type 2 diabetes mellitus with diabetic polyneuropathy: Secondary | ICD-10-CM

## 2023-03-03 DIAGNOSIS — M7751 Other enthesopathy of right foot: Secondary | ICD-10-CM | POA: Diagnosis not present

## 2023-03-03 DIAGNOSIS — M7752 Other enthesopathy of left foot: Secondary | ICD-10-CM | POA: Diagnosis not present

## 2023-03-03 DIAGNOSIS — D689 Coagulation defect, unspecified: Secondary | ICD-10-CM | POA: Diagnosis not present

## 2023-03-03 MED ORDER — DEXAMETHASONE SODIUM PHOSPHATE 120 MG/30ML IJ SOLN
4.0000 mg | Freq: Once | INTRAMUSCULAR | Status: AC
  Administered 2023-03-03: 4 mg via INTRA_ARTICULAR

## 2023-03-04 NOTE — Progress Notes (Signed)
He presents today complaining of painful areas beneath the fifth metatarsal heads bilaterally he is also complaining of painful calluses and elongated toenails.  Objective: Vital signs stable he is alert and oriented x 3 there is no erythema he does have some fluctuance beneath the fifth metatarsal heads bilaterally consistent with bursitis and that is moderately tender on palpation.  He also has overlying reactive hyperkeratotic lesions in these areas.  Toenails are long thick yellow dystrophic onychomycotic.  Assessment: Pain in limb secondary to bursitis subfifth metatarsal head bilateral.  Overlying benign skin lesions plantar aspect fifth metatarsal head bilaterally.  Pain limb secondary to onychomycosis.  Plan: Discussed etiology pathology and surgical therapies injected 2 mg of dexamethasone beneath each fifth metatarsal head bilaterally this is for the bursitis.  Also debrided the benign skin lesion.  Debrided toenails 1 through 5 bilateral.

## 2023-03-05 ENCOUNTER — Encounter: Payer: Self-pay | Admitting: Internal Medicine

## 2023-03-05 NOTE — Progress Notes (Signed)
Outside notes received. Information abstracted. Notes sent to scan.  

## 2023-04-01 NOTE — Patient Instructions (Addendum)
       Medications changes include :   none     Return in about 3 months (around 07/04/2023) for follow up.

## 2023-04-01 NOTE — Progress Notes (Signed)
Subjective:    Patient ID: Brett Franklin, male    DOB: June 27, 1926, 89 y.o.   MRN: [id number]     HPI Kenroy is here for follow up of his chronic medical problems.  He was here approximately 1 month ago and had a couple episodes of coughing up blood within the couple weeks before his visit.  I also referred him for PT for his poor balance, leg weakness.  He feels this is helping.   Overall doing well - has no concerns.   Medications and allergies reviewed with patient and updated if appropriate.  Current Outpatient Medications on File Prior to Visit  Medication Sig Dispense Refill   blood glucose meter kit and supplies KIT Dispense based on patient and insurance preference. Use to text blood sugars daily. 1 each 0   diltiazem (CARDIZEM CD) 120 MG 24 hr capsule TAKE 1 CAPSULE(120 MG) BY MOUTH DAILY 90 capsule 1   diltiazem (CARDIZEM) 30 MG tablet TAKE 1 TABLET(30 MG) BY MOUTH EVERY 6 HOURS AS NEEDED 90 tablet 1   ELIQUIS 5 MG TABS tablet TAKE 1 TABLET(5 MG) BY MOUTH TWICE DAILY 180 tablet 3   glucose blood test strip Use to test blood sugars once daily. Dx code-E11.49 100 each 3   JANUMET 50-500 MG tablet TAKE 1 TABLET BY MOUTH DAILY 90 tablet 1   Lancets (FREESTYLE) lancets Use to check blood sugars daily. 100 each 1   levothyroxine (SYNTHROID) 50 MCG tablet TAKE 1 TABLET(50 MCG) BY MOUTH DAILY 90 tablet 3   LORazepam (ATIVAN) 0.5 MG tablet TAKE 1 TABLET(0.5 MG) BY MOUTH EVERY 12 HOURS AS NEEDED FOR ANXIETY 20 tablet 2   metoprolol tartrate (LOPRESSOR) 25 MG tablet Take 25 mg by mouth 2 (two) times daily.     mometasone (NASONEX) 50 MCG/ACT nasal spray SMARTSIG:2 Spray(s) Both Nares Every Night     MULTAQ 400 MG tablet TAKE 1 TABLET(400 MG) BY MOUTH TWICE DAILY WITH A MEAL 60 tablet 5   mupirocin ointment (BACTROBAN) 2 % Apply to wound after soaking BID 30 g 1   polyethylene glycol (MIRALAX / GLYCOLAX) 17 g packet Take 17 g by mouth daily.     pravastatin (PRAVACHOL) 20 MG  tablet TAKE 1 TABLET(20 MG) BY MOUTH DAILY 90 tablet 1   No current facility-administered medications on file prior to visit.     Review of Systems  Constitutional:  Negative for fever.  HENT:  Positive for congestion (chronic) and postnasal drip.   Respiratory:  Positive for cough (chronic from PND). Negative for shortness of breath and wheezing.        Objective:   Vitals:   04/03/23 0749  BP: 116/78  Pulse: 80  Temp: 98 F (36.7 C)  SpO2: 98%   BP Readings from Last 3 Encounters:  04/03/23 116/78  02/26/23 120/80  11/26/22 126/64   Wt Readings from Last 3 Encounters:  04/03/23 153 lb (69.4 kg)  02/26/23 154 lb (69.9 kg)  01/05/23 156 lb (70.8 kg)   Body mass index is 23.96 kg/m.    Physical Exam Constitutional:      General: He is not in acute distress.    Appearance: Normal appearance. He is not ill-appearing.  HENT:     Head: Normocephalic and atraumatic.  Eyes:     Conjunctiva/sclera: Conjunctivae normal.  Cardiovascular:     Rate and Rhythm: Normal rate and regular rhythm.     Heart sounds: Normal heart sounds.  Pulmonary:     Effort: Pulmonary effort is normal. No respiratory distress.     Breath sounds: Normal breath sounds. No wheezing or rales.  Musculoskeletal:     Right lower leg: No edema.     Left lower leg: No edema.  Skin:    General: Skin is warm and dry.     Findings: No rash.  Neurological:     Mental Status: He is alert. Mental status is at baseline.  Psychiatric:        Mood and Affect: Mood normal.        Lab Results  Component Value Date   WBC 8.3 11/26/2022   HGB 13.5 11/26/2022   HCT 39.5 11/26/2022   PLT 215.0 11/26/2022   GLUCOSE 114 (H) 11/26/2022   CHOL 100 11/26/2022   TRIG 41.0 11/26/2022   HDL 58.00 11/26/2022   LDLCALC 33 11/26/2022   ALT 17 11/26/2022   AST 18 11/26/2022   NA 138 11/26/2022   K 4.2 11/26/2022   CL 103 11/26/2022   CREATININE 1.20 11/26/2022   BUN 26 (H) 11/26/2022   CO2 26 11/26/2022    TSH 4.93 11/26/2022   INR 0.99 12/21/2009   HGBA1C 6.4 11/26/2022   MICROALBUR 2.8 (H) 11/24/2019     Assessment & Plan:    See Problem List for Assessment and Plan of chronic medical problems.

## 2023-04-03 ENCOUNTER — Encounter: Payer: Self-pay | Admitting: Internal Medicine

## 2023-04-03 ENCOUNTER — Ambulatory Visit (INDEPENDENT_AMBULATORY_CARE_PROVIDER_SITE_OTHER): Payer: Medicare Other | Admitting: Internal Medicine

## 2023-04-03 VITALS — BP 116/78 | HR 80 | Temp 98.0°F | Ht 67.0 in | Wt 153.0 lb

## 2023-04-03 DIAGNOSIS — F419 Anxiety disorder, unspecified: Secondary | ICD-10-CM | POA: Diagnosis not present

## 2023-04-03 DIAGNOSIS — I10 Essential (primary) hypertension: Secondary | ICD-10-CM | POA: Diagnosis not present

## 2023-04-03 DIAGNOSIS — R2689 Other abnormalities of gait and mobility: Secondary | ICD-10-CM

## 2023-04-03 DIAGNOSIS — R042 Hemoptysis: Secondary | ICD-10-CM | POA: Diagnosis not present

## 2023-04-03 NOTE — Assessment & Plan Note (Signed)
Chronic ?Blood pressure well controlled ?Continue diltiazem 120 mg daily, metoprolol 25 mg twice daily ?

## 2023-04-03 NOTE — Assessment & Plan Note (Addendum)
1 month ago he had a couple of episodes of small amount of blood in sputum which had resolved by the time he saw me We deferred further evaluation at that time since it had resolved and was likely a benign process Today he states no further hemoptysis He has his chronic cough from PND that is unchanged

## 2023-04-03 NOTE — Assessment & Plan Note (Signed)
Chronic Currently doing physical therapy Using cane

## 2023-04-03 NOTE — Assessment & Plan Note (Signed)
Chronic Controlled, Stable Continue lorazepam 0.5 mg twice daily as needed 

## 2023-04-04 ENCOUNTER — Other Ambulatory Visit: Payer: Self-pay | Admitting: Cardiology

## 2023-05-01 ENCOUNTER — Ambulatory Visit: Payer: Medicare Other | Admitting: Physician Assistant

## 2023-05-01 ENCOUNTER — Encounter: Payer: Self-pay | Admitting: Physician Assistant

## 2023-05-01 VITALS — BP 112/56 | HR 58 | Ht 69.5 in | Wt 153.0 lb

## 2023-05-01 DIAGNOSIS — I480 Paroxysmal atrial fibrillation: Secondary | ICD-10-CM

## 2023-05-01 DIAGNOSIS — I10 Essential (primary) hypertension: Secondary | ICD-10-CM | POA: Diagnosis not present

## 2023-05-01 DIAGNOSIS — D6869 Other thrombophilia: Secondary | ICD-10-CM | POA: Diagnosis not present

## 2023-05-01 MED ORDER — DILTIAZEM HCL ER COATED BEADS 120 MG PO CP24: 120.0000 mg | ORAL_CAPSULE | Freq: Every day | ORAL | 3 refills | Status: DC

## 2023-05-01 MED ORDER — DILTIAZEM HCL 30 MG PO TABS: 30.0000 mg | ORAL_TABLET | ORAL | 3 refills | Status: DC | PRN

## 2023-05-01 NOTE — Patient Instructions (Signed)
Medication Instructions:   Your physician recommends that you continue on your current medications as directed. Please refer to the Current Medication list given to you today.  *If you need a refill on your cardiac medications before your next appointment, please call your pharmacy*    Follow-Up: At [facility], you and your health needs are our priority.  As part of our continuing mission to provide you with exceptional heart care, we have created designated Provider Care Teams.  These Care Teams include your primary Cardiologist (physician) and Advanced Practice Providers (APPs -  Physician Assistants and Nurse Practitioners) who all work together to provide you with the care you need, when you need it.  We recommend signing up for the patient portal called "MyChart".  Sign up information is provided on this After Visit Summary.  MyChart is used to connect with patients for Virtual Visits (Telemedicine).  Patients are able to view lab/test results, encounter notes, upcoming appointments, etc.  Non-urgent messages can be sent to your provider as well.   To learn more about what you can do with MyChart, go to [email or web address].    Your next appointment:   6 month(s)  Provider:   Dr. Elberta Fortis or Francis Dowse PA-C

## 2023-05-01 NOTE — Progress Notes (Signed)
Cardiology Office Note Date:  05/01/2023  Patient ID:  Brett Franklin, Brett Franklin [birth year], MRN [id number] PCP:  Pincus Sanes, MD  Electrophysiologist: Dr. Elberta Fortis    Chief Complaint:  6 mo  History of Present Illness: Brett Franklin is a 89 y.o. male with history of HTN, stroke, DM, AFib  He saw Dr. Elberta Fortis 10/21/22, doing well, tolerating meds, no changes were made.  TODAY He is accompanied by his wife No CP, palpitations or cardiac awareness No SOB, DOE No near syncope or syncope Denies bleeding or signs of bleeding other then easy bruising.  They don't travel as much, they (he) gets out in the garden, they like to go to the casino and enjoy activities with friends.  AFib/AAD hx Dx 2020 Multaq started Sept 2020   Past Medical History:  Diagnosis Date   Adenomatous polyp of colon 11/1996   Dr Russella Dar   Anxiety    BPH (benign prostatic hyperplasia)    Cervical spinal stenosis    CVA (cerebral vascular accident) (HCC)    Diabetes mellitus, type 2 (HCC)    GERD (gastroesophageal reflux disease)    Hearing loss    Hemorrhoids    Hypertension    IBS (irritable bowel syndrome)    Melanoma (HCC)    Nephrolithiasis     X 4    Past Surgical History:  Procedure Laterality Date   CARDIOVERSION N/A 04/13/2019   Procedure: CARDIOVERSION;  Surgeon: Jodelle Red, MD;  Location: [facility] ENDOSCOPY;  Service: Cardiovascular;  Laterality: N/A;   CATARACT EXTRACTION Left    CHOLECYSTECTOMY     COLONOSCOPY W/ POLYPECTOMY     Dr.Stark   CYSTOSCOPY     Dr.Kimbrough     Current Outpatient Medications  Medication Sig Dispense Refill   blood glucose meter kit and supplies KIT Dispense based on patient and insurance preference. Use to text blood sugars daily. 1 each 0   diltiazem (CARDIZEM CD) 120 MG 24 hr capsule TAKE 1 CAPSULE(120 MG) BY MOUTH DAILY 90 capsule 1   diltiazem (CARDIZEM) 30 MG tablet TAKE 1 TABLET(30 MG) BY MOUTH EVERY 6 HOURS AS NEEDED 90 tablet 1   ELIQUIS 5  MG TABS tablet TAKE 1 TABLET(5 MG) BY MOUTH TWICE DAILY 180 tablet 3   glucose blood test strip Use to test blood sugars once daily. Dx code-E11.49 100 each 3   JANUMET 50-500 MG tablet TAKE 1 TABLET BY MOUTH DAILY 90 tablet 1   Lancets (FREESTYLE) lancets Use to check blood sugars daily. 100 each 1   levothyroxine (SYNTHROID) 50 MCG tablet TAKE 1 TABLET(50 MCG) BY MOUTH DAILY 90 tablet 3   LORazepam (ATIVAN) 0.5 MG tablet TAKE 1 TABLET(0.5 MG) BY MOUTH EVERY 12 HOURS AS NEEDED FOR ANXIETY 20 tablet 2   metoprolol tartrate (LOPRESSOR) 25 MG tablet Take 25 mg by mouth 2 (two) times daily.     mometasone (NASONEX) 50 MCG/ACT nasal spray SMARTSIG:2 Spray(s) Both Nares Every Night     MULTAQ 400 MG tablet TAKE 1 TABLET(400 MG) BY MOUTH TWICE DAILY WITH A MEAL 60 tablet 5   mupirocin ointment (BACTROBAN) 2 % Apply to wound after soaking BID 30 g 1   polyethylene glycol (MIRALAX / GLYCOLAX) 17 g packet Take 17 g by mouth daily.     pravastatin (PRAVACHOL) 20 MG tablet TAKE 1 TABLET(20 MG) BY MOUTH DAILY 90 tablet 1   No current facility-administered medications for this visit.    Allergies:   Lisinopril  Social History:  The patient  reports that he has never smoked. He has never used smokeless tobacco. He reports that he does not drink alcohol and does not use drugs.   Family History:  The patient's family history includes Alcohol abuse in his father; Diabetes type I in an other family member; Heart attack (age of onset: 26) in his maternal grandfather; Heart disease in his father.  ROS:  Please see the history of present illness.    All other systems are reviewed and otherwise negative.   PHYSICAL EXAM:  VS:  BP (!) 112/56   Pulse (!) 58   Ht 5' 9.5" (1.765 m)   Wt 153 lb (69.4 kg)   SpO2 94%   BMI 22.27 kg/m  BMI: Body mass index is 22.27 kg/m. Well nourished, well developed, in no acute distress HEENT: normocephalic, atraumatic Neck: no JVD, carotid bruits or masses Cardiac:    RRR; no significant murmurs, no rubs, or gallops Lungs:  CTA b/l, no wheezing, rhonchi or rales Abd: soft, nontender MS: no deformity, age appropriate atrophy Ext: no edema Skin: warm and dry, no rash Neuro:  No gross deficits appreciated Psych: euthymic mood, full affect  EKG:  not done today    12/21/2009: TTE  Study Conclusions   Left ventricle: The cavity size was normal. Wall thickness was   normal. Systolic function was normal. The estimated ejection   fraction was in the range of 55% to 60%.    Recent Labs: 11/26/2022: ALT 17; BUN 26; Creatinine, Ser 1.20; Hemoglobin 13.5; Platelets 215.0; Potassium 4.2; Sodium 138; TSH 4.93  11/26/2022: Cholesterol 100; HDL 58.00; LDL Cholesterol 33; Total CHOL/HDL Ratio 2; Triglycerides 41.0; VLDL 8.2   CrCl cannot be calculated (Patient's most recent lab result is older than the maximum 21 days allowed.).   Wt Readings from Last 3 Encounters:  05/01/23 153 lb (69.4 kg)  04/03/23 153 lb (69.4 kg)  02/26/23 154 lb (69.9 kg)     Other studies reviewed: Additional studies/records reviewed today include: summarized above  ASSESSMENT AND PLAN:  Paroxysmal AFib CHA2DS2Vasc is 5, on Eliquis, appropriately dosed Low/no burden by symptoms continue multaq  Sees his PMD every 3 mo, gets labs 2x/year   HTN Looks great   Secondary hypercoagulable state    Disposition: F/u with [place] again in 26mo, sooner if needed  Current medicines are reviewed at length with the patient today.  The patient did not have any concerns regarding medicines.  Brett Fredrickson, PA-C 05/01/2023 10:56 AM     [facility] [address] [place] [address] [phone] (office)  [phone] (fax)

## 2023-05-12 ENCOUNTER — Ambulatory Visit: Payer: Medicare Other | Admitting: Podiatrist

## 2023-05-12 ENCOUNTER — Encounter: Payer: Self-pay | Admitting: Podiatrist

## 2023-05-12 DIAGNOSIS — D689 Coagulation defect, unspecified: Secondary | ICD-10-CM | POA: Diagnosis not present

## 2023-05-12 DIAGNOSIS — B351 Tinea unguium: Secondary | ICD-10-CM | POA: Diagnosis not present

## 2023-05-12 DIAGNOSIS — M79676 Pain in unspecified toe(s): Secondary | ICD-10-CM

## 2023-05-12 DIAGNOSIS — E1142 Type 2 diabetes mellitus with diabetic polyneuropathy: Secondary | ICD-10-CM

## 2023-05-12 DIAGNOSIS — D2372 Other benign neoplasm of skin of left lower limb, including hip: Secondary | ICD-10-CM | POA: Diagnosis not present

## 2023-05-12 DIAGNOSIS — D2371 Other benign neoplasm of skin of right lower limb, including hip: Secondary | ICD-10-CM

## 2023-05-12 NOTE — Progress Notes (Signed)
   Chief Complaint  Patient presents with   Debridement    Trim toenails/calluses     HPI: Patient is 89 y.o. male who presents today for painful areas beneath the fifth metatarsal heads bilateral.  He relates he gets injections to help with the pain.  As well as callus debridement.    Allergies  Allergen Reactions   Lisinopril Cough    Hacking cough; he was changed to losartan    Review of systems is negative except as noted in the HPI.  Denies nausea/ vomiting/ fevers/ chills or night sweats.   Denies difficulty breathing, denies calf pain or tenderness  Physical Exam  Patient is awake, alert, and oriented x 3.  In no acute distress.    Vascular status is intact with palpable pedal pulses DP and PT bilateral and capillary refill time less than 3 seconds bilateral.  No edema or erythema noted.   Neurological exam reveals epicritic and protective sensation grossly intact bilateral.   Dermatological exam reveals skin is supple and dry to bilateral feet.  No open lesions present.  Pain submet 5 bilaterally with palpable bursae noted. Digital nails are at a good length today-  debridement not indicated.   Musculoskeletal exam: Musculature intact with dorsiflexion, plantarflexion, inversion, eversion. Ankle and First MPJ joint range of motion normal.     Assessment:   ICD-10-CM   1. Pain due to onychomycosis of toenail  B35.1    M79.676     2. Diabetic polyneuropathy associated with type 2 diabetes mellitus (HCC)  E11.42     3. Coagulation defect (HCC)  D68.9     4. Benign neoplasm of skin of left foot  D23.72     5. Benign neoplasm of skin of right foot  D23.71        Plan: Debridement of toenails was not needed at todays visit.   Injected his bursae fifth metatarsal bilateral and debrided his callus lesions and applied salinocaine and a bandage    Recommended follow up in 4 weeks with Dr. Al Corpus. He is to call sooner if any pedal concerns arise.

## 2023-05-31 ENCOUNTER — Other Ambulatory Visit: Payer: Self-pay | Admitting: Internal Medicine

## 2023-06-16 ENCOUNTER — Ambulatory Visit (INDEPENDENT_AMBULATORY_CARE_PROVIDER_SITE_OTHER): Payer: Medicare Other | Admitting: Podiatry

## 2023-06-16 DIAGNOSIS — D2371 Other benign neoplasm of skin of right lower limb, including hip: Secondary | ICD-10-CM | POA: Diagnosis not present

## 2023-06-16 DIAGNOSIS — E1142 Type 2 diabetes mellitus with diabetic polyneuropathy: Secondary | ICD-10-CM | POA: Diagnosis not present

## 2023-06-16 DIAGNOSIS — M79676 Pain in unspecified toe(s): Secondary | ICD-10-CM

## 2023-06-16 DIAGNOSIS — B351 Tinea unguium: Secondary | ICD-10-CM

## 2023-06-16 DIAGNOSIS — D689 Coagulation defect, unspecified: Secondary | ICD-10-CM

## 2023-06-16 DIAGNOSIS — D2372 Other benign neoplasm of skin of left lower limb, including hip: Secondary | ICD-10-CM | POA: Diagnosis not present

## 2023-06-16 NOTE — Progress Notes (Signed)
He presents today complaining of pain to his fifth metatarsals bilateral.  Objective: Vital signs stable he is alert oriented x 3 bursitis beneath the fifth metatarsal heads bilateral with benign skin lesions subfifth metatarsal head bilateral.  No open lesions or wounds.  Assessment: Painful bursitis subfifth metatarsal heads bilateral with benign skin lesions.  Plan: Debridement of benign skin lesions and injected the bursa today 2 mg of dexamethasone local anesthetic.  Follow-up with him in 3 to 4 weeks

## 2023-06-18 ENCOUNTER — Encounter: Payer: Self-pay | Admitting: Family Medicine

## 2023-06-18 ENCOUNTER — Ambulatory Visit (INDEPENDENT_AMBULATORY_CARE_PROVIDER_SITE_OTHER): Payer: Medicare Other | Admitting: Family Medicine

## 2023-06-18 VITALS — BP 134/82 | HR 85 | Temp 98.1°F | Ht 69.5 in | Wt 155.0 lb

## 2023-06-18 DIAGNOSIS — R52 Pain, unspecified: Secondary | ICD-10-CM | POA: Diagnosis not present

## 2023-06-18 DIAGNOSIS — U071 COVID-19: Secondary | ICD-10-CM | POA: Insufficient documentation

## 2023-06-18 DIAGNOSIS — H903 Sensorineural hearing loss, bilateral: Secondary | ICD-10-CM | POA: Insufficient documentation

## 2023-06-18 DIAGNOSIS — R051 Acute cough: Secondary | ICD-10-CM | POA: Diagnosis not present

## 2023-06-18 LAB — POC COVID19 BINAXNOW: SARS Coronavirus 2 Ag: POSITIVE — AB

## 2023-06-18 MED ORDER — PROMETHAZINE-DM 6.25-15 MG/5ML PO SYRP: 2.5000 mL | ORAL_SOLUTION | Freq: Four times a day (QID) | ORAL | 0 refills | Status: DC | PRN

## 2023-06-18 NOTE — Patient Instructions (Addendum)
Your Covid test is positive.   Treat your symptoms with Tylenol, vitamins, and staying hydrated.   Take the cough syrup I prescribed but be aware this medication is sedating.   Follow up if you are getting significantly worse.

## 2023-06-18 NOTE — Progress Notes (Signed)
Subjective:  Brett Franklin is a 89 y.o. male who presents for a 2 day hx of body aches, fatigue, rhinorrhea, congestion, ST and cough. His family member had Covid last week.   Denies fever, chills, dizziness, chest pain, shortness of breath, abdominal pain, N/V/D.    ROS as in subjective.   Objective: There were no vitals filed for this visit.  General appearance: Alert, WD/WN, no distress, mildly ill appearing                             Skin: warm, no rash                           Head: no sinus tenderness                            Eyes: conjunctiva normal, corneas clear, PERRLA                            Ears: pearly TMs, external ear canals normal                          Nose: mask on              Mouth/throat: mask on                            Neck: supple, no adenopathy                          Heart: RRR                         Lungs: CTA bilaterally, no wheezes, rales, or rhonchi      Assessment: COVID-19 virus infection  Acute cough - Plan: promethazine-dextromethorphan (PROMETHAZINE-DM) 6.25-15 MG/5ML syrup, POC COVID-19 BinaxNow  Body aches - Plan: POC COVID-19 BinaxNow   Plan: Covid positive. Paxlovid not prescribed. He is on medications contraindicated. No acute distress. Requests medication for cough. Promethazine DM prescribed and caution advised.   Suggested symptomatic OTC remedies. Nasal saline spray for congestion.  Tylenol OTC.  Call/return if worsening.

## 2023-07-02 ENCOUNTER — Other Ambulatory Visit: Payer: Self-pay | Admitting: Internal Medicine

## 2023-07-05 ENCOUNTER — Encounter: Payer: Self-pay | Admitting: Internal Medicine

## 2023-07-05 NOTE — Patient Instructions (Addendum)
    Flu immunization administered today.     Blood work was ordered.   The lab is on the first floor.    Medications changes include :   None     Return in about 3 months (around 10/06/2023) for follow up.

## 2023-07-05 NOTE — Progress Notes (Unsigned)
Subjective:    Patient ID: Brett Franklin, male    DOB: 1926-02-24, 89 y.o.   MRN: [id number]     HPI Lasalle is here for follow up of his chronic medical problems.  Overall doing well.  Medications and allergies reviewed with patient and updated if appropriate.  Current Outpatient Medications on File Prior to Visit  Medication Sig Dispense Refill   blood glucose meter kit and supplies KIT Dispense based on patient and insurance preference. Use to text blood sugars daily. 1 each 0   diltiazem (CARDIZEM CD) 120 MG 24 hr capsule Take 1 capsule (120 mg total) by mouth daily. 90 capsule 3   diltiazem (CARDIZEM) 30 MG tablet Take 1 tablet (30 mg total) by mouth as needed. 90 tablet 3   ELIQUIS 5 MG TABS tablet TAKE 1 TABLET(5 MG) BY MOUTH TWICE DAILY 180 tablet 3   glucose blood test strip Use to test blood sugars once daily. Dx code-E11.49 100 each 3   JANUMET 50-500 MG tablet TAKE 1 TABLET BY MOUTH DAILY 90 tablet 1   Lancets (FREESTYLE) lancets Use to check blood sugars daily. 100 each 1   levothyroxine (SYNTHROID) 50 MCG tablet TAKE 1 TABLET(50 MCG) BY MOUTH DAILY 90 tablet 3   LORazepam (ATIVAN) 0.5 MG tablet TAKE 1 TABLET(0.5 MG) BY MOUTH EVERY 12 HOURS AS NEEDED FOR ANXIETY 20 tablet 2   metoprolol tartrate (LOPRESSOR) 25 MG tablet Take 25 mg by mouth 2 (two) times daily.     mometasone (NASONEX) 50 MCG/ACT nasal spray SMARTSIG:2 Spray(s) Both Nares Every Night     MULTAQ 400 MG tablet TAKE 1 TABLET(400 MG) BY MOUTH TWICE DAILY WITH A MEAL. 60 tablet 5   mupirocin ointment (BACTROBAN) 2 % Apply to wound after soaking BID 30 g 1   polyethylene glycol (MIRALAX / GLYCOLAX) 17 g packet Take 17 g by mouth daily.     pravastatin (PRAVACHOL) 20 MG tablet TAKE 1 TABLET(20 MG) BY MOUTH DAILY 90 tablet 1   No current facility-administered medications on file prior to visit.     Review of Systems  Constitutional:  Positive for fatigue (residual from covid). Negative for fever.   HENT:  Positive for congestion. Negative for sinus pain and sore throat.   Respiratory:  Positive for cough (dry from cough, occ brings up white phlegm). Negative for shortness of breath and wheezing.   Cardiovascular:  Negative for chest pain, palpitations and leg swelling.  Neurological:  Negative for light-headedness and headaches.       Objective:   Vitals:   07/06/23 0754  BP: 108/72  Pulse: 82  Temp: 98.2 F (36.8 C)  SpO2: 100%   BP Readings from Last 3 Encounters:  07/06/23 108/72  06/18/23 134/82  05/01/23 (!) 112/56   Wt Readings from Last 3 Encounters:  07/06/23 152 lb (68.9 kg)  06/18/23 155 lb (70.3 kg)  05/01/23 153 lb (69.4 kg)   Body mass index is 22.12 kg/m.    Physical Exam Constitutional:      General: He is not in acute distress.    Appearance: Normal appearance. He is not ill-appearing.  HENT:     Head: Normocephalic and atraumatic.  Eyes:     Conjunctiva/sclera: Conjunctivae normal.  Cardiovascular:     Rate and Rhythm: Normal rate and regular rhythm.     Heart sounds: Normal heart sounds.  Pulmonary:     Effort: Pulmonary effort is normal. No respiratory distress.  Breath sounds: Normal breath sounds. No wheezing or rales.  Musculoskeletal:     Right lower leg: No edema.     Left lower leg: No edema.  Skin:    General: Skin is warm and dry.     Findings: No rash.  Neurological:     Mental Status: He is alert. Mental status is at baseline.  Psychiatric:        Mood and Affect: Mood normal.        Lab Results  Component Value Date   WBC 8.3 11/26/2022   HGB 13.5 11/26/2022   HCT 39.5 11/26/2022   PLT 215.0 11/26/2022   GLUCOSE 114 (H) 11/26/2022   CHOL 100 11/26/2022   TRIG 41.0 11/26/2022   HDL 58.00 11/26/2022   LDLCALC 33 11/26/2022   ALT 17 11/26/2022   AST 18 11/26/2022   NA 138 11/26/2022   K 4.2 11/26/2022   CL 103 11/26/2022   CREATININE 1.20 11/26/2022   BUN 26 (H) 11/26/2022   CO2 26 11/26/2022   TSH  4.93 11/26/2022   INR 0.99 12/21/2009   HGBA1C 6.4 11/26/2022   MICROALBUR 2.8 (H) 11/24/2019     Assessment & Plan:    See Problem List for Assessment and Plan of chronic medical problems.

## 2023-07-06 ENCOUNTER — Ambulatory Visit (INDEPENDENT_AMBULATORY_CARE_PROVIDER_SITE_OTHER): Payer: Medicare Other | Admitting: Internal Medicine

## 2023-07-06 VITALS — BP 108/72 | HR 82 | Temp 98.2°F | Ht 69.5 in | Wt 152.0 lb

## 2023-07-06 DIAGNOSIS — Z7984 Long term (current) use of oral hypoglycemic drugs: Secondary | ICD-10-CM

## 2023-07-06 DIAGNOSIS — E7849 Other hyperlipidemia: Secondary | ICD-10-CM

## 2023-07-06 DIAGNOSIS — M542 Cervicalgia: Secondary | ICD-10-CM

## 2023-07-06 DIAGNOSIS — E039 Hypothyroidism, unspecified: Secondary | ICD-10-CM

## 2023-07-06 DIAGNOSIS — Z23 Encounter for immunization: Secondary | ICD-10-CM

## 2023-07-06 DIAGNOSIS — E1149 Type 2 diabetes mellitus with other diabetic neurological complication: Secondary | ICD-10-CM

## 2023-07-06 DIAGNOSIS — I480 Paroxysmal atrial fibrillation: Secondary | ICD-10-CM

## 2023-07-06 DIAGNOSIS — I10 Essential (primary) hypertension: Secondary | ICD-10-CM

## 2023-07-06 DIAGNOSIS — F419 Anxiety disorder, unspecified: Secondary | ICD-10-CM

## 2023-07-06 LAB — LIPID PANEL
Cholesterol: 99 mg/dL (ref 0–200)
HDL: 57.4 mg/dL (ref >39.00)
LDL Cholesterol: 34 mg/dL (ref 0–99)
NonHDL: 41.36
Total CHOL/HDL Ratio: 2
Triglycerides: 39 mg/dL (ref 0.0–149.0)
VLDL: 7.8 mg/dL (ref 0.0–40.0)

## 2023-07-06 LAB — CBC WITH DIFFERENTIAL/PLATELET
Basophils Absolute: 0 10*3/uL (ref 0.0–0.1)
Basophils Relative: 0.3 % (ref 0.0–3.0)
Eosinophils Absolute: 0.1 10*3/uL (ref 0.0–0.7)
Eosinophils Relative: 0.9 % (ref 0.0–5.0)
HCT: 40.1 % (ref 39.0–52.0)
Hemoglobin: 13.2 g/dL (ref 13.0–17.0)
Lymphocytes Relative: 21.3 % (ref 12.0–46.0)
Lymphs Abs: 1.7 10*3/uL (ref 0.7–4.0)
MCHC: 32.9 g/dL (ref 30.0–36.0)
MCV: 97.5 fL (ref 78.0–100.0)
Monocytes Absolute: 0.8 10*3/uL (ref 0.1–1.0)
Monocytes Relative: 10.6 % (ref 3.0–12.0)
Neutro Abs: 5.3 10*3/uL (ref 1.4–7.7)
Neutrophils Relative %: 66.9 % (ref 43.0–77.0)
Platelets: 245 10*3/uL (ref 150.0–400.0)
RBC: 4.11 Mil/uL — ABNORMAL LOW (ref 4.22–5.81)
RDW: 14.3 % (ref 11.5–15.5)
WBC: 7.9 10*3/uL (ref 4.0–10.5)

## 2023-07-06 LAB — COMPREHENSIVE METABOLIC PANEL
ALT: 16 U/L (ref 0–53)
AST: 18 U/L (ref 0–37)
Albumin: 3.9 g/dL (ref 3.5–5.2)
Alkaline Phosphatase: 60 U/L (ref 39–117)
BUN: 21 mg/dL (ref 6–23)
CO2: 27 meq/L (ref 19–32)
Calcium: 9.2 mg/dL (ref 8.4–10.5)
Chloride: 103 meq/L (ref 96–112)
Creatinine, Ser: 1.21 mg/dL (ref 0.40–1.50)
GFR: 50.86 mL/min — ABNORMAL LOW (ref >60.00)
Glucose, Bld: 106 mg/dL — ABNORMAL HIGH (ref 70–99)
Potassium: 4.4 meq/L (ref 3.5–5.1)
Sodium: 138 meq/L (ref 135–145)
Total Bilirubin: 0.6 mg/dL (ref 0.2–1.2)
Total Protein: 6.8 g/dL (ref 6.0–8.3)

## 2023-07-06 LAB — TSH: TSH: 4.77 u[IU]/mL (ref 0.35–5.50)

## 2023-07-06 LAB — HEMOGLOBIN A1C: Hgb A1c MFr Bld: 6.7 % — ABNORMAL HIGH (ref 4.6–6.5)

## 2023-07-06 MED ORDER — LEVOTHYROXINE SODIUM 50 MCG PO TABS: 50.0000 ug | ORAL_TABLET | Freq: Every day | ORAL | 3 refills | Status: DC

## 2023-07-06 MED ORDER — LORAZEPAM 0.5 MG PO TABS: ORAL_TABLET | ORAL | 5 refills | Status: DC

## 2023-07-06 NOTE — Assessment & Plan Note (Signed)
Chronic Intermittent Has taken Tylenol #3-1 tablet every 8 hours as needed-currently not on his medication list he does not take often, but I will refill if needed

## 2023-07-06 NOTE — Assessment & Plan Note (Signed)
Chronic Follows with cardiology CBC, CMP On Eliquis 5 mg twice daily, Multaq 400 mg twice daily, diltiazem 120 mg daily, metoprolol 25 mg daily Has diltiazem 30 mg prn for episodes of A-fib if needed

## 2023-07-06 NOTE — Assessment & Plan Note (Signed)
Chronic  Clinically euthyroid Check TSH-will titrate dose if necessary Currently taking levothyroxine 50 mcg daily

## 2023-07-06 NOTE — Addendum Note (Signed)
Addended by: Karma Ganja on: 07/06/2023 09:14 AM   Modules accepted: Orders

## 2023-07-06 NOTE — Assessment & Plan Note (Signed)
Chronic  Lab Results  Component Value Date   HGBA1C 6.4 11/26/2022   Sugars well controlled Check A1c Continue Janumet 50-500 mg daily Stressed regular exercise, diabetic diet

## 2023-07-06 NOTE — Assessment & Plan Note (Signed)
Chronic Regular exercise and healthy diet encouraged Check lipid panel, CMP Continue pravastatin 20 mg daily 

## 2023-07-06 NOTE — Assessment & Plan Note (Signed)
Chronic Controlled, Stable Continue lorazepam 0.5 mg twice daily as needed

## 2023-07-06 NOTE — Addendum Note (Signed)
Addended by: Karma Ganja on: 07/06/2023 08:55 AM   Modules accepted: Orders

## 2023-07-06 NOTE — Assessment & Plan Note (Signed)
Chronic Blood pressure well controlled CBC, CMP Continue diltiazem 120 mg daily, metoprolol 25 mg twice daily

## 2023-07-07 ENCOUNTER — Ambulatory Visit: Payer: Medicare Other | Admitting: Podiatry

## 2023-07-09 ENCOUNTER — Encounter: Payer: Self-pay | Admitting: Podiatry

## 2023-07-09 ENCOUNTER — Ambulatory Visit: Payer: Medicare Other | Admitting: Podiatry

## 2023-07-09 DIAGNOSIS — M79676 Pain in unspecified toe(s): Secondary | ICD-10-CM

## 2023-07-09 DIAGNOSIS — D2371 Other benign neoplasm of skin of right lower limb, including hip: Secondary | ICD-10-CM | POA: Diagnosis not present

## 2023-07-09 DIAGNOSIS — M7752 Other enthesopathy of left foot: Secondary | ICD-10-CM | POA: Diagnosis not present

## 2023-07-09 DIAGNOSIS — D689 Coagulation defect, unspecified: Secondary | ICD-10-CM

## 2023-07-09 DIAGNOSIS — D2372 Other benign neoplasm of skin of left lower limb, including hip: Secondary | ICD-10-CM

## 2023-07-09 DIAGNOSIS — E1142 Type 2 diabetes mellitus with diabetic polyneuropathy: Secondary | ICD-10-CM | POA: Diagnosis not present

## 2023-07-09 DIAGNOSIS — B351 Tinea unguium: Secondary | ICD-10-CM | POA: Diagnosis not present

## 2023-07-09 MED ORDER — DEXAMETHASONE SODIUM PHOSPHATE 120 MG/30ML IJ SOLN
2.0000 mg | Freq: Once | INTRAMUSCULAR | Status: AC
Start: 2023-07-09 — End: 2023-07-09
  Administered 2023-07-09: 2 mg via INTRA_ARTICULAR

## 2023-07-09 NOTE — Progress Notes (Signed)
He presents today chief complaint of a painful area beneath the fifth metatarsal head of the left foot.  He states that he feels like walking on a rock.  Is also complaining about his painful elongated toenails.  Objective: Vital signs stable alert oriented x 3.  Pulses are palpable.  There is no erythema edema cellulitis drainage or odor.  He has fluctuance beneath the fifth metatarsal head of the left foot.  Toenails are slightly elongated thickened discolored dystrophic painful.  Assessment: Benign skin lesion beneath the fifth metatarsal head of the left foot with bursitis.  He also has pain in limb secondary to onychomycosis.  Plan: Debrided nails 1 through 5 bilateral injected 4 mg of dexamethasone local anesthetic beneath the fifth metatarsal head to alleviate the bursitis.  I also debrided the benign reactive skin lesion.

## 2023-07-22 ENCOUNTER — Encounter: Payer: Self-pay | Admitting: Internal Medicine

## 2023-07-22 ENCOUNTER — Ambulatory Visit: Payer: Medicare Other | Admitting: Internal Medicine

## 2023-07-22 VITALS — BP 114/78 | HR 64 | Temp 98.6°F | Ht 69.5 in

## 2023-07-22 DIAGNOSIS — I10 Essential (primary) hypertension: Secondary | ICD-10-CM

## 2023-07-22 DIAGNOSIS — T148XXA Other injury of unspecified body region, initial encounter: Secondary | ICD-10-CM | POA: Diagnosis not present

## 2023-07-22 MED ORDER — MUPIROCIN 2 % EX OINT: TOPICAL_OINTMENT | CUTANEOUS | 1 refills | Status: AC

## 2023-07-22 NOTE — Assessment & Plan Note (Signed)
Chronic Blood pressure well controlled Denies any issues with lightheadedness or dizziness Continue diltiazem 120 mg daily, metoprolol 25 mg twice daily

## 2023-07-22 NOTE — Assessment & Plan Note (Signed)
Acute Related to fall 5 days ago Tetanus up-to-date No symptoms consistent with infection Wounds bandaged-antibacterial ointment, nonstick pads and Coban Wife will continue to change bandages May need debridement-referral ordered for wound care center

## 2023-07-22 NOTE — Progress Notes (Signed)
Subjective:    Patient ID: Brett Franklin, male    DOB: Jun 12, 1926, 89 y.o.   MRN: [id number]      HPI Brett Franklin is here for  Chief Complaint  Patient presents with   Fall    Fall on Friday night coming down the ramp he fell; Right arm hit rail and he hurt left leg  He is here today with his wife.   [place] he thinks he got his feet tangled up.  He denies any other concerning symptoms that would have resulted in the fall.  He scraped his right forearm and left lower leg.  He has two abrasions/skin tears.  He has not had any fevers.  He denies any pus, but a couple of the wounds have been oozing blood because he is on Eliquis.  His wife has changed his bandages.  He denies any pain.    Medications and allergies reviewed with patient and updated if appropriate.  Current Outpatient Medications on File Prior to Visit  Medication Sig Dispense Refill   blood glucose meter kit and supplies KIT Dispense based on patient and insurance preference. Use to text blood sugars daily. 1 each 0   diltiazem (CARDIZEM CD) 120 MG 24 hr capsule Take 1 capsule (120 mg total) by mouth daily. 90 capsule 3   diltiazem (CARDIZEM) 30 MG tablet Take 1 tablet (30 mg total) by mouth as needed. 90 tablet 3   ELIQUIS 5 MG TABS tablet TAKE 1 TABLET(5 MG) BY MOUTH TWICE DAILY 180 tablet 3   glucose blood test strip Use to test blood sugars once daily. Dx code-E11.49 100 each 3   JANUMET 50-500 MG tablet TAKE 1 TABLET BY MOUTH DAILY 90 tablet 1   Lancets (FREESTYLE) lancets Use to check blood sugars daily. 100 each 1   levothyroxine (SYNTHROID) 50 MCG tablet Take 1 tablet (50 mcg total) by mouth daily before breakfast. 90 tablet 3   LORazepam (ATIVAN) 0.5 MG tablet TAKE 1 TABLET(0.5 MG) BY MOUTH EVERY 12 HOURS AS NEEDED FOR ANXIETY 20 tablet 5   metoprolol tartrate (LOPRESSOR) 25 MG tablet Take 25 mg by mouth 2 (two) times daily.     mometasone (NASONEX) 50 MCG/ACT  nasal spray SMARTSIG:2 Spray(s) Both Nares Every Night     MULTAQ 400 MG tablet TAKE 1 TABLET(400 MG) BY MOUTH TWICE DAILY WITH A MEAL. 60 tablet 5   mupirocin ointment (BACTROBAN) 2 % Apply to wound after soaking BID 30 g 1   polyethylene glycol (MIRALAX / GLYCOLAX) 17 g packet Take 17 g by mouth daily.     pravastatin (PRAVACHOL) 20 MG tablet TAKE 1 TABLET(20 MG) BY MOUTH DAILY 90 tablet 1   No current facility-administered medications on file prior to visit.    Review of Systems     Objective:   Vitals:   07/22/23 1015  BP: 114/78  Pulse: 64  Temp: 98.6 F (37 C)  SpO2: 98%   BP Readings from Last 3 Encounters:  07/22/23 114/78  07/06/23 108/72  06/18/23 134/82   Wt Readings from Last 3 Encounters:  07/06/23 152 lb (68.9 kg)  06/18/23 155 lb (70.3 kg)  05/01/23 153 lb (69.4 kg)   Body mass index is 22.12 kg/m.    Physical Exam Constitutional:      General: He is not in acute distress.    Appearance: Normal appearance. He is not ill-appearing.  HENT:  Head: Normocephalic and atraumatic.  Skin:    General: Skin is warm and dry.     Comments: Irregular shaped skin tear left proximal anterior lower leg, some edges with dead skin, no active bleeding, no pus.  No surrounding erythema.  No surrounding edema or fluctuance.  Right medial forearm with 3 skin abrasions/skin tears-2 small, 1 medium size.  There is slight oozing of blood, but no pus.  No surrounding erythema or swelling.  Some areas of dead skin.  Neurological:     Mental Status: He is alert. Mental status is at baseline.  Psychiatric:        Mood and Affect: Mood normal.            Assessment & Plan:    See Problem List for Assessment and Plan of chronic medical problems.

## 2023-07-22 NOTE — Patient Instructions (Signed)
        Medications changes include :   none    A referral was ordered wound center and someone will call you to schedule an appointment.

## 2023-08-04 ENCOUNTER — Ambulatory Visit (INDEPENDENT_AMBULATORY_CARE_PROVIDER_SITE_OTHER): Payer: Medicare Other | Admitting: Podiatry

## 2023-08-04 ENCOUNTER — Encounter: Payer: Self-pay | Admitting: Podiatry

## 2023-08-04 DIAGNOSIS — M7752 Other enthesopathy of left foot: Secondary | ICD-10-CM | POA: Diagnosis not present

## 2023-08-04 DIAGNOSIS — D2372 Other benign neoplasm of skin of left lower limb, including hip: Secondary | ICD-10-CM

## 2023-08-04 MED ORDER — DEXAMETHASONE SODIUM PHOSPHATE 120 MG/30ML IJ SOLN
2.0000 mg | Freq: Once | INTRAMUSCULAR | Status: AC
Start: 2023-08-04 — End: 2023-08-04
  Administered 2023-08-04: 2 mg via INTRA_ARTICULAR

## 2023-08-04 NOTE — Progress Notes (Signed)
He presents today chief complaint of a painful callus beneath the fifth metatarsal head of the left foot.  Objective: Vital signs are stable he is alert oriented x 3 bursitis beneath the fifth metatarsal head of the left foot.  Benign skin lesion subfifth met head left.  Plan: Discussed etiology pathology and surgical therapies at this point injected 2 mg of dexamethasone local anesthetic beneath the fifth met head of the left foot for the bursitis.  Debrided the reactive hyperkeratotic tissue he felt instant relief.

## 2023-09-01 ENCOUNTER — Encounter: Payer: Self-pay | Admitting: Podiatry

## 2023-09-01 ENCOUNTER — Ambulatory Visit (INDEPENDENT_AMBULATORY_CARE_PROVIDER_SITE_OTHER): Payer: Medicare Other | Admitting: Podiatry

## 2023-09-01 DIAGNOSIS — M7751 Other enthesopathy of right foot: Secondary | ICD-10-CM

## 2023-09-01 DIAGNOSIS — D2371 Other benign neoplasm of skin of right lower limb, including hip: Secondary | ICD-10-CM

## 2023-09-01 DIAGNOSIS — D2372 Other benign neoplasm of skin of left lower limb, including hip: Secondary | ICD-10-CM | POA: Diagnosis not present

## 2023-09-01 DIAGNOSIS — M7752 Other enthesopathy of left foot: Secondary | ICD-10-CM | POA: Diagnosis not present

## 2023-09-01 DIAGNOSIS — E1142 Type 2 diabetes mellitus with diabetic polyneuropathy: Secondary | ICD-10-CM | POA: Diagnosis not present

## 2023-09-01 DIAGNOSIS — D689 Coagulation defect, unspecified: Secondary | ICD-10-CM

## 2023-09-01 MED ORDER — DEXAMETHASONE SODIUM PHOSPHATE 120 MG/30ML IJ SOLN
4.0000 mg | Freq: Once | INTRAMUSCULAR | Status: AC
Start: 2023-09-01 — End: 2023-09-01
  Administered 2023-09-01: 4 mg via INTRA_ARTICULAR

## 2023-09-01 NOTE — Progress Notes (Signed)
He presents today chief complaint of painful calluses plantar aspect of the forefoot laterally bilateral.  His nails are in good shape currently do not need to be trimmed.  Objective: Vital signs stable alert and oriented x 3.  Pulses are palpable.  Benign skin lesions plantar aspect of the bilateral foot with underlying bursa subfifth metatarsals bilateral.  Mild erythema fluctuance is noted.  Assessment: Bursitis capsulitis fifth metatarsophalangeal joint benign skin lesion.  Plan: I injected the bursa today of dexamethasone 2 mg injected into each bursa.  Debrided overlying benign skin lesions.  Follow-up with him in 3 to 4 weeks at which time we will trim nails and calluses and bursa.

## 2023-09-11 ENCOUNTER — Telehealth: Payer: Self-pay | Admitting: Cardiology

## 2023-09-11 NOTE — Telephone Encounter (Signed)
Patient c/o Palpitations:  High priority if patient c/o lightheadedness, shortness of breath, or chest pain  How long have you had palpitations/irregular HR/ Afib? Are you having the symptoms now? This morning  Are you currently experiencing lightheadedness, SOB or CP? No  Do you have a history of afib (atrial fibrillation) or irregular heart rhythm? Yes  Have you checked your BP or HR? (document readings if available): HR 110 normally in the 60  Are you experiencing any other symptoms? Per wife, patient feels washed out.

## 2023-09-11 NOTE — Telephone Encounter (Signed)
Spoke to wife. She reports that he went to the eye doctor today and had eyes dilated, since then his HRs have been low 100s, highest 115 bpm. He took PRN Diltiazem 30 mg at 10:30 am & again at 12:30 pm. Informed wife that HRs are probably secondary to eye dilation medication.  Pt is not having any symptoms, just feels washed out. Educated to only take diltiazem 30 mg PRN q6h and to call office and speak with someone if does not help/improve HRs. This makes her feel so much better and relieved going in to the weekend with a plan in place. If persists they will call next week to discuss/schedule OV. She appreciates my call.

## 2023-10-04 ENCOUNTER — Encounter: Payer: Self-pay | Admitting: Internal Medicine

## 2023-10-04 NOTE — Patient Instructions (Addendum)
       Medications changes include :   None      Return in about 3 months (around 01/04/2024) for follow up.

## 2023-10-04 NOTE — Progress Notes (Signed)
 Subjective:    Patient ID: Brett Franklin, male    DOB: [birth year], 89 y.o.   MRN: [id number]     HPI Brett Franklin is here for follow up of his chronic medical problems.  Last labs 07/06/23.    No falls.    He wakes up at night with a dry mouth.    He has a cough and increased mucus often after eating.  Usually after dinner - transient.  It does not bother him much.  It is not daily and not getting worse.   Does PT twice a week.  Rides his bike in the winter.     Medications and allergies reviewed with patient and updated if appropriate.  Current Outpatient Medications on File Prior to Visit  Medication Sig Dispense Refill   blood glucose meter kit and supplies KIT Dispense based on patient and insurance preference. Use to text blood sugars daily. 1 each 0   diltiazem  (CARDIZEM  CD) 120 MG 24 hr capsule Take 1 capsule (120 mg total) by mouth daily. 90 capsule 3   diltiazem  (CARDIZEM ) 30 MG tablet Take 1 tablet (30 mg total) by mouth as needed. 90 tablet 3   ELIQUIS  5 MG TABS tablet TAKE 1 TABLET(5 MG) BY MOUTH TWICE DAILY 180 tablet 3   glucose blood test strip Use to test blood sugars once daily. Dx code-E11.49 100 each 3   JANUMET  50-500 MG tablet TAKE 1 TABLET BY MOUTH DAILY 90 tablet 1   Lancets (FREESTYLE) lancets Use to check blood sugars daily. 100 each 1   levothyroxine  (SYNTHROID ) 50 MCG tablet Take 1 tablet (50 mcg total) by mouth daily before breakfast. 90 tablet 3   LORazepam  (ATIVAN ) 0.5 MG tablet TAKE 1 TABLET(0.5 MG) BY MOUTH EVERY 12 HOURS AS NEEDED FOR ANXIETY 20 tablet 5   metoprolol  tartrate (LOPRESSOR ) 25 MG tablet Take 25 mg by mouth 2 (two) times daily.     mometasone (NASONEX) 50 MCG/ACT nasal spray SMARTSIG:2 Spray(s) Both Nares Every Night     MULTAQ  400 MG tablet TAKE 1 TABLET(400 MG) BY MOUTH TWICE DAILY WITH A MEAL. 60 tablet 5   mupirocin  ointment (BACTROBAN ) 2 % Apply to wound with dressing changes 30 g 1   polyethylene glycol (MIRALAX / GLYCOLAX) 17  g packet Take 17 g by mouth daily.     pravastatin  (PRAVACHOL ) 20 MG tablet TAKE 1 TABLET(20 MG) BY MOUTH DAILY 90 tablet 1   No current facility-administered medications on file prior to visit.     Review of Systems  Constitutional:  Negative for fever.  HENT:  Negative for trouble swallowing.   Respiratory:  Positive for cough (mild - at night or after eating dinner). Negative for shortness of breath and wheezing.   Cardiovascular:  Negative for chest pain, palpitations and leg swelling.  Gastrointestinal:        Denies gerd  Neurological:  Negative for light-headedness and headaches.       Objective:   Vitals:   10/06/23 0944  BP: 108/76  Pulse: 72  Temp: 98 F (36.7 C)  SpO2: 96%   BP Readings from Last 3 Encounters:  10/06/23 108/76  07/22/23 114/78  07/06/23 108/72   Wt Readings from Last 3 Encounters:  10/06/23 152 lb (68.9 kg)  07/06/23 152 lb (68.9 kg)  06/18/23 155 lb (70.3 kg)   Body mass index is 22.12 kg/m.    Physical Exam Constitutional:      General: He is  not in acute distress.    Appearance: Normal appearance. He is not ill-appearing.  HENT:     Head: Normocephalic and atraumatic.  Eyes:     Conjunctiva/sclera: Conjunctivae normal.  Cardiovascular:     Rate and Rhythm: Normal rate and regular rhythm.     Heart sounds: Normal heart sounds.  Pulmonary:     Effort: Pulmonary effort is normal. No respiratory distress.     Breath sounds: Normal breath sounds. No wheezing or rales.  Musculoskeletal:     Right lower leg: No edema.     Left lower leg: No edema.  Skin:    General: Skin is warm and dry.     Findings: No rash.  Neurological:     Mental Status: He is alert. Mental status is at baseline.  Psychiatric:        Mood and Affect: Mood normal.        Lab Results  Component Value Date   WBC 7.9 07/06/2023   HGB 13.2 07/06/2023   HCT 40.1 07/06/2023   PLT 245.0 07/06/2023   GLUCOSE 106 (H) 07/06/2023   CHOL 99 07/06/2023    TRIG 39.0 07/06/2023   HDL 57.40 07/06/2023   LDLCALC 34 07/06/2023   ALT 16 07/06/2023   AST 18 07/06/2023   NA 138 07/06/2023   K 4.4 07/06/2023   CL 103 07/06/2023   CREATININE 1.21 07/06/2023   BUN 21 07/06/2023   CO2 27 07/06/2023   TSH 4.77 07/06/2023   INR 0.99 12/21/2009   HGBA1C 6.7 (H) 07/06/2023   MICROALBUR 2.8 (H) 11/24/2019     Assessment & Plan:    See Problem List for Assessment and Plan of chronic medical problems.

## 2023-10-06 ENCOUNTER — Ambulatory Visit (INDEPENDENT_AMBULATORY_CARE_PROVIDER_SITE_OTHER): Payer: Medicare Other | Admitting: Internal Medicine

## 2023-10-06 VITALS — BP 108/76 | HR 72 | Temp 98.0°F | Ht 69.5 in | Wt 152.0 lb

## 2023-10-06 DIAGNOSIS — I480 Paroxysmal atrial fibrillation: Secondary | ICD-10-CM

## 2023-10-06 DIAGNOSIS — E039 Hypothyroidism, unspecified: Secondary | ICD-10-CM

## 2023-10-06 DIAGNOSIS — E1149 Type 2 diabetes mellitus with other diabetic neurological complication: Secondary | ICD-10-CM | POA: Diagnosis not present

## 2023-10-06 DIAGNOSIS — I10 Essential (primary) hypertension: Secondary | ICD-10-CM

## 2023-10-06 DIAGNOSIS — E7849 Other hyperlipidemia: Secondary | ICD-10-CM

## 2023-10-06 DIAGNOSIS — F419 Anxiety disorder, unspecified: Secondary | ICD-10-CM

## 2023-10-06 DIAGNOSIS — Z7984 Long term (current) use of oral hypoglycemic drugs: Secondary | ICD-10-CM

## 2023-10-06 DIAGNOSIS — M542 Cervicalgia: Secondary | ICD-10-CM

## 2023-10-06 DIAGNOSIS — R053 Chronic cough: Secondary | ICD-10-CM

## 2023-10-06 MED ORDER — JANUMET 50-500 MG PO TABS: 1.0000 | ORAL_TABLET | Freq: Every day | ORAL | 1 refills | Status: DC

## 2023-10-06 MED ORDER — PRAVASTATIN SODIUM 20 MG PO TABS: ORAL_TABLET | ORAL | 1 refills | Status: DC

## 2023-10-06 NOTE — Assessment & Plan Note (Signed)
Chronic Blood pressure well controlled Denies any issues with lightheadedness or dizziness Continue diltiazem 120 mg daily, metoprolol 25 mg twice daily

## 2023-10-06 NOTE — Assessment & Plan Note (Signed)
 Chronic  Lab Results  Component Value Date   HGBA1C 6.7 (H) 07/06/2023   Sugars well controlled Continue Janumet 50-500 mg daily Stressed regular exercise, diabetic diet

## 2023-10-06 NOTE — Assessment & Plan Note (Signed)
 Chronic Has had an intermittent cough for years off and after the Transient but not daily No dysphagia Denies heartburn Does not bother him enough to undergo further evaluation or do a trial of medication so we will just monitor

## 2023-10-06 NOTE — Assessment & Plan Note (Signed)
 Chronic  Clinically euthyroid Last tsh normal Currently taking levothyroxine 50 mcg daily - continue

## 2023-10-06 NOTE — Assessment & Plan Note (Signed)
Chronic Follows with cardiology On Eliquis 5 mg twice daily, Multaq 400 mg twice daily, diltiazem 120 mg daily, metoprolol 25 mg daily Has diltiazem 30 mg prn for episodes of A-fib if needed

## 2023-10-06 NOTE — Assessment & Plan Note (Signed)
Chronic Controlled, Stable Continue lorazepam 0.5 mg twice daily as needed

## 2023-10-06 NOTE — Assessment & Plan Note (Signed)
 Chronic Intermittent Has taken Tylenol #3-1 tablet every 8 hours as needed in the past-currently not on his medication list he does not take often, but I will refill if needed

## 2023-10-06 NOTE — Assessment & Plan Note (Signed)
 Chronic Regular exercise and healthy diet encouraged Lab Results  Component Value Date   LDLCALC 34 07/06/2023   Continue pravastatin 20 mg daily

## 2023-10-13 ENCOUNTER — Ambulatory Visit: Payer: Medicare Other | Admitting: Podiatry

## 2023-10-20 ENCOUNTER — Ambulatory Visit: Payer: Medicare Other | Admitting: Podiatry

## 2023-10-29 ENCOUNTER — Encounter: Payer: Self-pay | Admitting: Podiatry

## 2023-10-29 ENCOUNTER — Ambulatory Visit (INDEPENDENT_AMBULATORY_CARE_PROVIDER_SITE_OTHER): Payer: Medicare Other | Admitting: Podiatry

## 2023-10-29 DIAGNOSIS — B351 Tinea unguium: Secondary | ICD-10-CM | POA: Diagnosis not present

## 2023-10-29 DIAGNOSIS — D2371 Other benign neoplasm of skin of right lower limb, including hip: Secondary | ICD-10-CM | POA: Diagnosis not present

## 2023-10-29 DIAGNOSIS — E1142 Type 2 diabetes mellitus with diabetic polyneuropathy: Secondary | ICD-10-CM

## 2023-10-29 DIAGNOSIS — D689 Coagulation defect, unspecified: Secondary | ICD-10-CM | POA: Diagnosis not present

## 2023-10-29 DIAGNOSIS — D2372 Other benign neoplasm of skin of left lower limb, including hip: Secondary | ICD-10-CM

## 2023-10-29 DIAGNOSIS — M79676 Pain in unspecified toe(s): Secondary | ICD-10-CM

## 2023-10-29 NOTE — Progress Notes (Signed)
He presents today chief complaint of painful calluses bilateral foot and painful elongated toenails.  Objective: Vital signs are stable he is alert and oriented x 3 benign hyperkeratotic lesion plantar aspect of the bilateral foot particularly fifth metatarsal bilaterally.  Toenails are long thick yellow dystrophic with mycotic.  Assessment: Pain in limb secondary to onychomycosis benign skin lesion.  Plan: Debridement of benign skin lesion debridement toenails 1 through 5 bilaterally.  Follow-up with him in about 3 to 4 weeks.

## 2023-11-02 ENCOUNTER — Other Ambulatory Visit: Payer: Self-pay | Admitting: Internal Medicine

## 2023-11-12 ENCOUNTER — Encounter: Payer: Self-pay | Admitting: Cardiology

## 2023-11-12 ENCOUNTER — Ambulatory Visit: Payer: Medicare Other | Attending: Cardiology | Admitting: Cardiology

## 2023-11-12 VITALS — BP 114/56 | HR 56 | Ht 69.5 in | Wt 149.0 lb

## 2023-11-12 DIAGNOSIS — I10 Essential (primary) hypertension: Secondary | ICD-10-CM

## 2023-11-12 DIAGNOSIS — I4819 Other persistent atrial fibrillation: Secondary | ICD-10-CM

## 2023-11-12 DIAGNOSIS — I4892 Unspecified atrial flutter: Secondary | ICD-10-CM

## 2023-11-12 DIAGNOSIS — D6869 Other thrombophilia: Secondary | ICD-10-CM | POA: Diagnosis not present

## 2023-11-12 NOTE — Progress Notes (Signed)
  Electrophysiology Office Note:   Date:  11/12/2023  ID:  Brett Franklin, DOB [birth year], MRN [id number]  Primary Cardiologist: None Primary Heart Failure: None Electrophysiologist: Kensi Karr Jorja Loa, MD      History of Present Illness:   Brett Franklin is a 89 y.o. male with h/o pretension, CVA, diabetes, atrial fibrillation seen today for routine electrophysiology followup.   Since last being seen in our clinic the patient reports doing well.  He has no chest pain or shortness of breath.  He is able to do all of his daily activities.  He did have 1 episode of palpitations 3 to 4 months ago.  He had just been to the eye doctor and had his eyes dilated.  They apparently gave him a double dose to dilate his eyes.  He returned home and had significant palpitations.  When he woke up the next morning, he felt well without complaint.  he denies chest pain, palpitations, dyspnea, PND, orthopnea, nausea, vomiting, dizziness, syncope, edema, weight gain, or early satiety.   Review of systems complete and found to be negative unless listed in HPI.   EP Information / Studies Reviewed:    EKG is ordered today. Personal review as below.  EKG Interpretation Date/Time:  Thursday November 12 2023 10:47:32 EST Ventricular Rate:  56 PR Interval:  202 QRS Duration:  106 QT Interval:  454 QTC Calculation: 438 R Axis:   86  Text Interpretation: Sinus bradycardia When compared with ECG of 15-Jun-2019 09:03, No significant change was found Confirmed by Bowen Kia ([id number]) on 11/12/2023 10:53:14 AM     Risk Assessment/Calculations:    CHA2DS2-VASc Score = 6   This indicates a 9.7% annual risk of stroke. The patient's score is based upon: CHF History: 0 HTN History: 1 Diabetes History: 1 Stroke History: 2 Vascular Disease History: 0 Age Score: 2 Gender Score: 0             Physical Exam:   VS:  BP (!) 114/56 (BP Location: Right Arm, Patient Position: Sitting, Cuff Size: Normal)   Pulse  (!) 56   Ht 5' 9.5" (1.765 m)   Wt 149 lb (67.6 kg)   SpO2 97%   BMI 21.69 kg/m    Wt Readings from Last 3 Encounters:  11/12/23 149 lb (67.6 kg)  10/06/23 152 lb (68.9 kg)  07/06/23 152 lb (68.9 kg)     GEN: Well nourished, well developed in no acute distress NECK: No JVD; No carotid bruits CARDIAC: Regular rate and rhythm, no murmurs, rubs, gallops RESPIRATORY:  Clear to auscultation without rales, wheezing or rhonchi  ABDOMEN: Soft, non-tender, non-distended EXTREMITIES:  No edema; No deformity   ASSESSMENT AND PLAN:    1.  Paroxysmal atrial fibrillation: Currently on Multaq 400 mg twice daily.  Remains in sinus rhythm.  Brett Franklin continue with current management.  2.  Secondary hypercoagulable state: Currently on Eliquis for atrial fibrillation  3.  Hypertension: Currently well-controlled  Follow up with EP APP in 6 months  Signed, Jniyah Dantuono Jorja Loa, MD

## 2023-11-26 ENCOUNTER — Ambulatory Visit (INDEPENDENT_AMBULATORY_CARE_PROVIDER_SITE_OTHER): Payer: Medicare Other | Admitting: Podiatry

## 2023-11-26 DIAGNOSIS — D2371 Other benign neoplasm of skin of right lower limb, including hip: Secondary | ICD-10-CM | POA: Diagnosis not present

## 2023-11-26 DIAGNOSIS — B351 Tinea unguium: Secondary | ICD-10-CM | POA: Diagnosis not present

## 2023-11-26 DIAGNOSIS — D2372 Other benign neoplasm of skin of left lower limb, including hip: Secondary | ICD-10-CM

## 2023-11-26 DIAGNOSIS — E1142 Type 2 diabetes mellitus with diabetic polyneuropathy: Secondary | ICD-10-CM | POA: Diagnosis not present

## 2023-11-26 DIAGNOSIS — D689 Coagulation defect, unspecified: Secondary | ICD-10-CM

## 2023-11-26 DIAGNOSIS — M79676 Pain in unspecified toe(s): Secondary | ICD-10-CM

## 2023-11-26 NOTE — Progress Notes (Signed)
 He presents today with chief complaint of painful calluses bilaterally painful elongated toenails.  Incurvated margins.  Objective: Vital signs are stable he is alert and oriented x 3.  Pulses are palpable no open lesions or wounds.  Benign hyperkeratotic lesion plantar aspect of the bilateral foot particularly beneath the fifth metatarsal heads bilaterally.  Toenails are thick and slightly elongated and tender on palpation extremities: Debridement.  Assessment: Pain in limb secondary to benign skin lesions and ingrown toenails.  Plan: Debridement of benign skin lesions debridement of toenails 1 through 5 bilateral, toes.

## 2023-11-30 ENCOUNTER — Encounter: Payer: Self-pay | Admitting: Internal Medicine

## 2023-11-30 ENCOUNTER — Ambulatory Visit: Payer: Self-pay | Admitting: Internal Medicine

## 2023-11-30 NOTE — Telephone Encounter (Signed)
 Copied from CRM [id number]. Topic: Clinical - Red Word Triage >> Nov 30, 2023 10:40 AM Alvino Blood C wrote: Red Word that prompted transfer to Nurse Triage: Patient has been coughing up blood and has had a cough for some time   Chief Complaint: Cough Symptoms: Cough, runny nose  Frequency: Mild, mostly after eating  Pertinent Negatives: Patient denies fever, shortness of breath  Disposition: [] ED /[] Urgent Care (no appt availability in office) / [x] Appointment(In office/virtual)/ []  [facility]/ [] Home Care/ [] Refused Recommended Disposition /[] [facility]/ []  Follow-up with PCP Additional Notes: Patient's wife called to have the patient re-evaluated for his cough. She states his cough is mild today and is worse after eating. She states that he has had some intermittent blood flecks in his sputum. Patient has no shortness of breath, fever, or chest pain. Appointment made for tomorrow for the patient.    Reason for Disposition  Cough has been present for > 3 weeks  Answer Assessment - Initial Assessment Questions 1. ONSET: "When did the cough begin?"      3-4 months  2. SEVERITY: "How bad is the cough today?" "Did the blood appear after a coughing spell?"      Mild 3. SPUTUM: "Describe the color of your sputum" (none, dry cough; clear, white, yellow, green)     White  4. HEMOPTYSIS: "How much blood?" (flecks, streaks, tablespoons, etc.)     Intermittent flecks of blood 5. DIFFICULTY BREATHING: "Are you having difficulty breathing?" If Yes, ask: "How bad is it?" (e.g., mild, moderate, severe)    - MILD: No SOB at rest, mild SOB with walking, speaks normally in sentences, can lie down, no retractions, pulse < 100.    - MODERATE: SOB at rest, SOB with minimal exertion and prefers to sit, cannot lie down flat, speaks in phrases, mild retractions, audible wheezing, pulse 100-120.    - SEVERE: Very SOB at rest, speaks in single words, struggling to breathe, sitting hunched  forward, retractions, pulse > 120      No 6. FEVER: "Do you have a fever?" If Yes, ask: "What is your temperature, how was it measured, and when did it start?"     No 7. CARDIAC HISTORY: "Do you have any history of heart disease?" (e.g., heart attack, congestive heart failure)      Atrial fibrillation  8. LUNG HISTORY: "Do you have any history of lung disease?"  (e.g., pulmonary embolus, asthma, emphysema)     No 9. PE RISK FACTORS: "Do you have a history of blood clots?" (or: recent major surgery, recent prolonged travel, bedridden)     No 10. OTHER SYMPTOMS: "Do you have any other symptoms?" (e.g., runny nose, wheezing, chest pain)       Runny nose  Protocols used: Coughing Up Blood-A-AH

## 2023-11-30 NOTE — Patient Instructions (Addendum)
     Have a chest xray downstairs    Medications changes include :   None    A referral was ordered for pulmonary.    Return if symptoms worsen or fail to improve.

## 2023-11-30 NOTE — Progress Notes (Unsigned)
 Subjective:    Patient ID: Brett Franklin, male    DOB: 08-10-1926, 89 y.o.   MRN: [id number]      HPI Brett Franklin is here for No chief complaint on file.   He is here for an acute visit for cold symptoms.  His symptoms started  He is experiencing   He has tried taking      Medications and allergies reviewed with patient and updated if appropriate.  Current Outpatient Medications on File Prior to Visit  Medication Sig Dispense Refill   blood glucose meter kit and supplies KIT Dispense based on patient and insurance preference. Use to text blood sugars daily. 1 each 0   diltiazem (CARDIZEM CD) 120 MG 24 hr capsule Take 1 capsule (120 mg total) by mouth daily. 90 capsule 3   diltiazem (CARDIZEM) 30 MG tablet Take 1 tablet (30 mg total) by mouth as needed. 90 tablet 3   ELIQUIS 5 MG TABS tablet TAKE 1 TABLET(5 MG) BY MOUTH TWICE DAILY 180 tablet 3   glucose blood test strip Use to test blood sugars once daily. Dx code-E11.49 100 each 3   Lancets (FREESTYLE) lancets Use to check blood sugars daily. 100 each 1   levothyroxine (SYNTHROID) 50 MCG tablet TAKE 1 TABLET(50 MCG) BY MOUTH DAILY 90 tablet 3   LORazepam (ATIVAN) 0.5 MG tablet TAKE 1 TABLET(0.5 MG) BY MOUTH EVERY 12 HOURS AS NEEDED FOR ANXIETY 20 tablet 5   metoprolol tartrate (LOPRESSOR) 25 MG tablet Take 25 mg by mouth 2 (two) times daily.     mometasone (NASONEX) 50 MCG/ACT nasal spray SMARTSIG:2 Spray(s) Both Nares Every Night     MULTAQ 400 MG tablet TAKE 1 TABLET(400 MG) BY MOUTH TWICE DAILY WITH A MEAL. 60 tablet 5   mupirocin ointment (BACTROBAN) 2 % Apply to wound with dressing changes 30 g 1   polyethylene glycol (MIRALAX / GLYCOLAX) 17 g packet Take 17 g by mouth daily.     pravastatin (PRAVACHOL) 20 MG tablet TAKE 1 TABLET(20 MG) BY MOUTH DAILY 90 tablet 1   sitaGLIPtin-metformin (JANUMET) 50-500 MG tablet Take 1 tablet by mouth daily. 90 tablet 1   No current facility-administered medications on file prior to  visit.    Review of Systems     Objective:  There were no vitals filed for this visit. BP Readings from Last 3 Encounters:  11/12/23 (!) 114/56  10/06/23 108/76  07/22/23 114/78   Wt Readings from Last 3 Encounters:  11/12/23 149 lb (67.6 kg)  10/06/23 152 lb (68.9 kg)  07/06/23 152 lb (68.9 kg)   There is no height or weight on file to calculate BMI.    Physical Exam Constitutional:      General: He is not in acute distress.    Appearance: Normal appearance. He is not ill-appearing.  HENT:     Head: Normocephalic.     Right Ear: Tympanic membrane, ear canal and external ear normal. There is no impacted cerumen.     Left Ear: Tympanic membrane, ear canal and external ear normal. There is no impacted cerumen.     Mouth/Throat:     Mouth: Mucous membranes are moist.     Pharynx: No oropharyngeal exudate or posterior oropharyngeal erythema.  Eyes:     Conjunctiva/sclera: Conjunctivae normal.  Cardiovascular:     Rate and Rhythm: Normal rate and regular rhythm.  Pulmonary:     Effort: Pulmonary effort is normal. No respiratory distress.  Breath sounds: Normal breath sounds. No wheezing or rales.  Musculoskeletal:     Cervical back: Neck supple. No tenderness.  Lymphadenopathy:     Cervical: No cervical adenopathy.  Skin:    General: Skin is warm and dry.     Findings: No rash.  Neurological:     Mental Status: He is alert.            Assessment & Plan:    See Problem List for Assessment and Plan of chronic medical problems.

## 2023-12-01 ENCOUNTER — Ambulatory Visit (INDEPENDENT_AMBULATORY_CARE_PROVIDER_SITE_OTHER): Admitting: Internal Medicine

## 2023-12-01 ENCOUNTER — Ambulatory Visit (INDEPENDENT_AMBULATORY_CARE_PROVIDER_SITE_OTHER)

## 2023-12-01 VITALS — BP 114/60 | HR 55 | Temp 97.5°F | Ht 69.5 in | Wt 149.0 lb

## 2023-12-01 DIAGNOSIS — R042 Hemoptysis: Secondary | ICD-10-CM

## 2023-12-01 DIAGNOSIS — I10 Essential (primary) hypertension: Secondary | ICD-10-CM

## 2023-12-01 DIAGNOSIS — R053 Chronic cough: Secondary | ICD-10-CM

## 2023-12-01 NOTE — Assessment & Plan Note (Signed)
 Chronic Has intermittent specks of blood in his cough that is chronic for several months now Typically only coughs after eating dinner-his larger meal He does cough up clear mucus-?  Related to PND versus other It is a very small amount of blood and sporadic Given that it has been persistent will refer to pulmonary for further input

## 2023-12-01 NOTE — Assessment & Plan Note (Signed)
 Chronic cough Typically coughs after he eats dinner and will bring up clear phlegm Cough may last 1-2 minutes Clear phlegm may have occasional speck of blood in it-because this has been persistent over the last several months he wanted to come back to have it reevaluated Chest x-ray today Referral to pulmonary to get their input He typically only coughs after dinner and is not coughing continuously No fevers, difficulty swallowing, GERD

## 2023-12-01 NOTE — Assessment & Plan Note (Signed)
Chronic ?Blood pressure well controlled ?Continue diltiazem 120 mg daily, metoprolol 25 mg twice daily ?

## 2023-12-02 ENCOUNTER — Other Ambulatory Visit: Payer: Self-pay | Admitting: Internal Medicine

## 2023-12-22 ENCOUNTER — Ambulatory Visit (INDEPENDENT_AMBULATORY_CARE_PROVIDER_SITE_OTHER): Payer: Medicare Other | Admitting: Podiatry

## 2023-12-22 DIAGNOSIS — M79676 Pain in unspecified toe(s): Secondary | ICD-10-CM

## 2023-12-22 DIAGNOSIS — D2372 Other benign neoplasm of skin of left lower limb, including hip: Secondary | ICD-10-CM

## 2023-12-22 DIAGNOSIS — D689 Coagulation defect, unspecified: Secondary | ICD-10-CM | POA: Diagnosis not present

## 2023-12-22 DIAGNOSIS — B351 Tinea unguium: Secondary | ICD-10-CM

## 2023-12-22 DIAGNOSIS — D2371 Other benign neoplasm of skin of right lower limb, including hip: Secondary | ICD-10-CM

## 2023-12-22 DIAGNOSIS — E1142 Type 2 diabetes mellitus with diabetic polyneuropathy: Secondary | ICD-10-CM | POA: Diagnosis not present

## 2023-12-23 NOTE — Progress Notes (Signed)
 He presents today chief complaint of painful calluses beneath the fifth metatarsal heads bilaterally.  Objective: Vital signs are stable he is alert and oriented x 3.  Pulses are palpable.  His toenails are in good shape he has just these painful areas that are painful for him benign hyperkeratotic lesions not demonstrating any preulcerative date.  Assessment: Pain in limb secondary to benign skin lesion subfifth metatarsal head bilaterally.  Plan: Mechanical debridement destruction of the lesion with a scalpel I will follow-up with him on an as-needed basis.

## 2024-01-02 ENCOUNTER — Other Ambulatory Visit: Payer: Self-pay | Admitting: Internal Medicine

## 2024-01-03 ENCOUNTER — Encounter: Payer: Self-pay | Admitting: Internal Medicine

## 2024-01-03 NOTE — Patient Instructions (Addendum)
      Blood work was ordered.       Medications changes include :   None    A referral was ordered for [facility] ENT and physical therapy and someone will call you to schedule an appointment.     Return in about 3 months (around 04/04/2024) for follow up.

## 2024-01-03 NOTE — Progress Notes (Unsigned)
      Subjective:    Patient ID: Brett Franklin, male    DOB: [birth year], 89 y.o.   MRN: [id number]     HPI Brett Franklin is here for follow up of his chronic medical problems.    Medications and allergies reviewed with patient and updated if appropriate.  Current Outpatient Medications on File Prior to Visit  Medication Sig Dispense Refill   blood glucose meter kit and supplies KIT Dispense based on patient and insurance preference. Use to text blood sugars daily. 1 each 0   diltiazem (CARDIZEM CD) 120 MG 24 hr capsule Take 1 capsule (120 mg total) by mouth daily. 90 capsule 3   diltiazem (CARDIZEM) 30 MG tablet Take 1 tablet (30 mg total) by mouth as needed. 90 tablet 3   ELIQUIS 5 MG TABS tablet TAKE 1 TABLET(5 MG) BY MOUTH TWICE DAILY 180 tablet 3   glucose blood test strip Use to test blood sugars once daily. Dx code-E11.49 100 each 3   Lancets (FREESTYLE) lancets Use to check blood sugars daily. 100 each 1   levothyroxine (SYNTHROID) 50 MCG tablet TAKE 1 TABLET(50 MCG) BY MOUTH DAILY 90 tablet 3   LORazepam (ATIVAN) 0.5 MG tablet TAKE 1 TABLET(0.5 MG) BY MOUTH EVERY 12 HOURS AS NEEDED FOR ANXIETY 20 tablet 5   metoprolol tartrate (LOPRESSOR) 25 MG tablet Take 25 mg by mouth 2 (two) times daily.     mometasone (NASONEX) 50 MCG/ACT nasal spray SMARTSIG:2 Spray(s) Both Nares Every Night     MULTAQ 400 MG tablet TAKE 1 TABLET(400 MG) BY MOUTH TWICE DAILY WITH A MEAL. 60 tablet 5   mupirocin ointment (BACTROBAN) 2 % Apply to wound with dressing changes 30 g 1   polyethylene glycol (MIRALAX / GLYCOLAX) 17 g packet Take 17 g by mouth daily.     pravastatin (PRAVACHOL) 20 MG tablet TAKE 1 TABLET(20 MG) BY MOUTH DAILY 90 tablet 1   sitaGLIPtin-metformin (JANUMET) 50-500 MG tablet Take 1 tablet by mouth daily. 90 tablet 1   No current facility-administered medications on file prior to visit.     Review of Systems     Objective:  There were no vitals filed for this visit. BP Readings  from Last 3 Encounters:  12/01/23 114/60  11/12/23 (!) 114/56  10/06/23 108/76   Wt Readings from Last 3 Encounters:  12/01/23 149 lb (67.6 kg)  11/12/23 149 lb (67.6 kg)  10/06/23 152 lb (68.9 kg)   There is no height or weight on file to calculate BMI.    Physical Exam     Lab Results  Component Value Date   WBC 7.9 07/06/2023   HGB 13.2 07/06/2023   HCT 40.1 07/06/2023   PLT 245.0 07/06/2023   GLUCOSE 106 (H) 07/06/2023   CHOL 99 07/06/2023   TRIG 39.0 07/06/2023   HDL 57.40 07/06/2023   LDLCALC 34 07/06/2023   ALT 16 07/06/2023   AST 18 07/06/2023   NA 138 07/06/2023   K 4.4 07/06/2023   CL 103 07/06/2023   CREATININE 1.21 07/06/2023   BUN 21 07/06/2023   CO2 27 07/06/2023   TSH 4.77 07/06/2023   INR 0.99 12/21/2009   HGBA1C 6.7 (H) 07/06/2023   MICROALBUR 2.8 (H) 11/24/2019     Assessment & Plan:    See Problem List for Assessment and Plan of chronic medical problems.

## 2024-01-04 ENCOUNTER — Ambulatory Visit (INDEPENDENT_AMBULATORY_CARE_PROVIDER_SITE_OTHER): Payer: Medicare Other | Admitting: Internal Medicine

## 2024-01-04 VITALS — BP 112/60 | HR 60 | Temp 98.0°F | Ht 69.5 in | Wt 150.0 lb

## 2024-01-04 DIAGNOSIS — Z7984 Long term (current) use of oral hypoglycemic drugs: Secondary | ICD-10-CM | POA: Diagnosis not present

## 2024-01-04 DIAGNOSIS — E1149 Type 2 diabetes mellitus with other diabetic neurological complication: Secondary | ICD-10-CM

## 2024-01-04 DIAGNOSIS — R2689 Other abnormalities of gait and mobility: Secondary | ICD-10-CM

## 2024-01-04 DIAGNOSIS — E7849 Other hyperlipidemia: Secondary | ICD-10-CM | POA: Diagnosis not present

## 2024-01-04 DIAGNOSIS — R053 Chronic cough: Secondary | ICD-10-CM

## 2024-01-04 DIAGNOSIS — F419 Anxiety disorder, unspecified: Secondary | ICD-10-CM

## 2024-01-04 DIAGNOSIS — I10 Essential (primary) hypertension: Secondary | ICD-10-CM | POA: Diagnosis not present

## 2024-01-04 DIAGNOSIS — M542 Cervicalgia: Secondary | ICD-10-CM

## 2024-01-04 DIAGNOSIS — E039 Hypothyroidism, unspecified: Secondary | ICD-10-CM

## 2024-01-04 DIAGNOSIS — I480 Paroxysmal atrial fibrillation: Secondary | ICD-10-CM

## 2024-01-04 LAB — LIPID PANEL
Cholesterol: 93 mg/dL (ref 0–200)
HDL: 55.6 mg/dL (ref >39.00)
LDL Cholesterol: 26 mg/dL (ref 0–99)
NonHDL: 36.93
Total CHOL/HDL Ratio: 2
Triglycerides: 55 mg/dL (ref 0.0–149.0)
VLDL: 11 mg/dL (ref 0.0–40.0)

## 2024-01-04 LAB — COMPREHENSIVE METABOLIC PANEL WITH GFR
ALT: 14 U/L (ref 0–53)
AST: 17 U/L (ref 0–37)
Albumin: 4.3 g/dL (ref 3.5–5.2)
Alkaline Phosphatase: 67 U/L (ref 39–117)
BUN: 27 mg/dL — ABNORMAL HIGH (ref 6–23)
CO2: 26 meq/L (ref 19–32)
Calcium: 9.5 mg/dL (ref 8.4–10.5)
Chloride: 104 meq/L (ref 96–112)
Creatinine, Ser: 1.29 mg/dL (ref 0.40–1.50)
GFR: 46.94 mL/min — ABNORMAL LOW (ref >60.00)
Glucose, Bld: 126 mg/dL — ABNORMAL HIGH (ref 70–99)
Potassium: 4.5 meq/L (ref 3.5–5.1)
Sodium: 140 meq/L (ref 135–145)
Total Bilirubin: 0.5 mg/dL (ref 0.2–1.2)
Total Protein: 7.1 g/dL (ref 6.0–8.3)

## 2024-01-04 LAB — CBC WITH DIFFERENTIAL/PLATELET
Basophils Absolute: 0 10*3/uL (ref 0.0–0.1)
Basophils Relative: 0.2 % (ref 0.0–3.0)
Eosinophils Absolute: 0 10*3/uL (ref 0.0–0.7)
Eosinophils Relative: 0.4 % (ref 0.0–5.0)
HCT: 40.6 % (ref 39.0–52.0)
Hemoglobin: 13.8 g/dL (ref 13.0–17.0)
Lymphocytes Relative: 16.1 % (ref 12.0–46.0)
Lymphs Abs: 1.6 10*3/uL (ref 0.7–4.0)
MCHC: 34 g/dL (ref 30.0–36.0)
MCV: 98.1 fl (ref 78.0–100.0)
Monocytes Absolute: 0.9 10*3/uL (ref 0.1–1.0)
Monocytes Relative: 9.6 % (ref 3.0–12.0)
Neutro Abs: 7.1 10*3/uL (ref 1.4–7.7)
Neutrophils Relative %: 73.7 % (ref 43.0–77.0)
Platelets: 202 10*3/uL (ref 150.0–400.0)
RBC: 4.13 Mil/uL — ABNORMAL LOW (ref 4.22–5.81)
RDW: 14.8 % (ref 11.5–15.5)
WBC: 9.7 10*3/uL (ref 4.0–10.5)

## 2024-01-04 LAB — HEMOGLOBIN A1C: Hgb A1c MFr Bld: 6.5 % (ref 4.6–6.5)

## 2024-01-04 LAB — TSH: TSH: 3.57 u[IU]/mL (ref 0.35–5.50)

## 2024-01-04 NOTE — Assessment & Plan Note (Signed)
 Chronic Intermittent Continue Tylenol as needed

## 2024-01-04 NOTE — Assessment & Plan Note (Signed)
 Chronic Blood pressure well controlled Cmp, cbc Continue diltiazem 120 mg daily, metoprolol 25 mg twice daily

## 2024-01-04 NOTE — Assessment & Plan Note (Signed)
 Chronic Has improved with physical therapy in the past Since stopping physical therapy his balance has gotten worse Using a cane to ambulate Denies any falls Referral back to physical therapy

## 2024-01-04 NOTE — Assessment & Plan Note (Addendum)
 Chronic  Lab Results  Component Value Date   HGBA1C 6.7 (H) 07/06/2023   Sugars well controlled Check a1c Continue Janumet 50-500 mg daily Stressed regular exercise, diabetic diet

## 2024-01-04 NOTE — Assessment & Plan Note (Signed)
Chronic Controlled, Stable Continue lorazepam 0.5 mg twice daily as needed

## 2024-01-04 NOTE — Assessment & Plan Note (Signed)
 Chronic Often coughs after he eats dinner and he will bring up a lot of clear phlegm. Cough can last 1-2 minutes He occasionally will have a very small amount of blood in it. Chest x-ray last month-lungs were clear He denied fevers, difficulty swallowing, reflux Will refer to ENT for further evaluation

## 2024-01-04 NOTE — Assessment & Plan Note (Signed)
 Chronic  Clinically euthyroid Check tsh Currently taking levothyroxine 50 mcg daily - continue, will adjust if needed

## 2024-01-04 NOTE — Assessment & Plan Note (Addendum)
 Chronic Follows with cardiology On Eliquis 5 mg twice daily, Multaq 400 mg twice daily, diltiazem 120 mg daily, metoprolol 25 mg daily Has diltiazem 30 mg prn for episodes of A-fib if needed-he took his first dose last week and it did help when he denies any side effects Check cbc, cmp

## 2024-01-04 NOTE — Assessment & Plan Note (Signed)
 Chronic Regular exercise and healthy diet encouraged Lab Results  Component Value Date   LDLCALC 34 07/06/2023   Continue pravastatin 20 mg daily Check lipids, cmp

## 2024-01-07 ENCOUNTER — Telehealth: Payer: Self-pay | Admitting: Internal Medicine

## 2024-01-07 NOTE — Telephone Encounter (Signed)
 Copied from CRM [id number]. Topic: Clinical - Lab/Test Results >> Jan 07, 2024 12:20 PM [place] J wrote: Reason for CRM: Patient's wife calling back for lab results. Lab results were relayed.

## 2024-01-19 ENCOUNTER — Ambulatory Visit (INDEPENDENT_AMBULATORY_CARE_PROVIDER_SITE_OTHER): Admitting: Podiatry

## 2024-01-19 ENCOUNTER — Encounter: Payer: Self-pay | Admitting: Podiatry

## 2024-01-19 DIAGNOSIS — D2372 Other benign neoplasm of skin of left lower limb, including hip: Secondary | ICD-10-CM

## 2024-01-19 DIAGNOSIS — D2371 Other benign neoplasm of skin of right lower limb, including hip: Secondary | ICD-10-CM

## 2024-01-20 ENCOUNTER — Other Ambulatory Visit: Payer: Self-pay | Admitting: Internal Medicine

## 2024-01-20 MED ORDER — BLOOD GLUCOSE MONITOR KIT: PACK | 0 refills | Status: DC

## 2024-01-20 MED ORDER — FREESTYLE LANCETS MISC: 1 refills | Status: DC

## 2024-01-20 MED ORDER — GLUCOSE BLOOD VI STRP: ORAL_STRIP | 3 refills | Status: AC

## 2024-01-20 NOTE — Progress Notes (Signed)
 He presents today for follow-up of his calluses.  He denies fever chills nausea vomit muscle aches pains calf pain back pain chest pain shortness of breath.  States that his calluses are painful as he refers to the subfifth metatarsal bilateral.  Objective: No open lesions are noted today mild reactive hyperkeratotic benign lesion subfifth metatarsal head bilateral.  No preulcerative lesions noted.  Pulses are palpable.  Assessment: Benign skin lesion subfifth met bilateral.  Plan: Debridement of reactive hyperkeratotic tissue.

## 2024-01-20 NOTE — Telephone Encounter (Signed)
 Copied from CRM [id number]. Topic: Clinical - Medication Refill >> Jan 20, 2024  8:18 AM Alethia Huxley E wrote: Most Recent Primary Care Visit:  Provider: BURNS, Beckey Bourgeois  Department: [facility]  Visit Type: OFFICE VISIT  Date: 01/04/2024  Medication: blood glucose meter kit and supplies KIT glucose blood test strip Lancets (FREESTYLE) lancets  Has the patient contacted their pharmacy? Yes (Agent: If no, request that the patient contact the pharmacy for the refill. If patient does not wish to contact the pharmacy document the reason why and proceed with request.) (Agent: If yes, when and what did the pharmacy advise?)  Is this the correct pharmacy for this prescription? Yes If no, delete pharmacy and type the correct one.  This is the patient's preferred pharmacy:  [id number] [place], [place] - [address] AT [facility] [address] [facility] [address] [place] [address] Phone: [phone] Fax: [phone]   Has the prescription been filled recently? No  Is the patient out of the medication? Yes  Has the patient been seen for an appointment in the last year OR does the patient have an upcoming appointment? Yes  Can we respond through MyChart? No  Agent: Please be advised that Rx refills may take up to 3 business days. We ask that you follow-up with your pharmacy.

## 2024-02-01 ENCOUNTER — Other Ambulatory Visit: Payer: Self-pay | Admitting: Internal Medicine

## 2024-02-08 ENCOUNTER — Telehealth: Payer: Self-pay | Admitting: Cardiology

## 2024-02-08 NOTE — Telephone Encounter (Signed)
 Spoke with patient's wife Patsy (OK per DPR). She reports for the past week patient has noticed his HR "jumping all over the place." She reports his baseline is HR in the 60's, but lately has been running up into the 90's occasionally low 100's. He has taken his PRN diltiazem  a few times with no improvement.  Patsy states patient denies SOB or chest pain. She states he feels weak and tired, and when his HR gets up into the 90's at times he feels like he might pass out.   Patsy requesting patient to be seen in clinic. Offered next available appt with Dr. Lawana Pray tomorrow 5/13 at 11:45am, she accepted.   Advised Patsy on ED precautions if symptoms worsen or HR remains elevated, she verbalized understanding.

## 2024-02-08 NOTE — Telephone Encounter (Signed)
 Patient c/o Palpitations:  STAT if patient reporting lightheadedness, shortness of breath, or chest pain  How long have you had palpitations/irregular HR/ Afib? Are you having the symptoms now? Wife couldn't give me a straight answer, says he just does not feel good.   Are you currently experiencing lightheadedness, SOB or CP? no  Do you have a history of afib (atrial fibrillation) or irregular heart rhythm? Yes, afib  Have you checked your BP or HR? (document readings if available): HR 93 (today) BP: 121/71 (yesterday)  Are you experiencing any other symptoms? Weakness and just does not feel good. Felt like he might pass out a couple of days ago

## 2024-02-09 ENCOUNTER — Encounter: Payer: Self-pay | Admitting: Cardiology

## 2024-02-09 ENCOUNTER — Ambulatory Visit

## 2024-02-09 ENCOUNTER — Ambulatory Visit: Attending: Cardiology | Admitting: Cardiology

## 2024-02-09 VITALS — BP 94/50 | HR 59 | Ht 69.5 in | Wt 148.0 lb

## 2024-02-09 DIAGNOSIS — I4892 Unspecified atrial flutter: Secondary | ICD-10-CM | POA: Diagnosis not present

## 2024-02-09 DIAGNOSIS — I4819 Other persistent atrial fibrillation: Secondary | ICD-10-CM

## 2024-02-09 DIAGNOSIS — I10 Essential (primary) hypertension: Secondary | ICD-10-CM

## 2024-02-09 DIAGNOSIS — D6869 Other thrombophilia: Secondary | ICD-10-CM

## 2024-02-09 NOTE — Patient Instructions (Signed)
 Testing/Procedures:  Brett Franklin- Long Term Monitor Instructions  Your physician has requested you wear a ZIO patch monitor for 14 days.  This is a single patch monitor. [employer] supplies one patch monitor per enrollment. Additional stickers are not available. Please do not apply patch if you will be having a Nuclear Stress Test,  Echocardiogram, Cardiac CT, MRI, or Chest Xray during the period you would be wearing the  monitor. The patch cannot be worn during these tests. You cannot remove and re-apply the  ZIO XT patch monitor.  Your ZIO patch monitor will be mailed 3 day USPS to your address on file. It may take 3-5 days  to receive your monitor after you have been enrolled.  Once you have received your monitor, please review the enclosed instructions. Your monitor  has already been registered assigning a specific monitor serial # to you.  Billing and Patient Assistance Program Information  We have supplied [employer] with any of your insurance information on file for billing purposes. [employer] offers a sliding scale Patient Assistance Program for patients that do not have  insurance, or whose insurance does not completely cover the cost of the ZIO monitor.  You must apply for the Patient Assistance Program to qualify for this discounted rate.  To apply, please call [employer] at [phone], select option 4, select option 2, ask to apply for  Patient Assistance Program. Brett Franklin will ask your household income, and how many people  are in your household. They will quote your out-of-pocket cost based on that information.  [employer] will also be able to set up a 52-month, interest-free payment plan if needed.  Applying the monitor   Shave hair from upper left chest.  Hold abrader disc by orange tab. Rub abrader in 40 strokes over the upper left chest as  indicated in your monitor instructions.  Clean area with 4 enclosed alcohol pads. Let dry.  Apply patch as indicated in monitor instructions.  Patch will be placed under collarbone on left  side of chest with arrow pointing upward.  Rub patch adhesive wings for 2 minutes. Remove white label marked "1". Remove the white  label marked "2". Rub patch adhesive wings for 2 additional minutes.  While looking in a mirror, press and release button in center of patch. A small green light will  flash 3-4 times. This will be your only indicator that the monitor has been turned on.  Do not shower for the first 24 hours. You may shower after the first 24 hours.  Press the button if you feel a symptom. You will hear a small click. Record Date, Time and  Symptom in the Patient Logbook.  When you are ready to remove the patch, follow instructions on the last 2 pages of Patient  Logbook. Stick patch monitor onto the last page of Patient Logbook.  Place Patient Logbook in the blue and white box. Use locking tab on box and tape box closed  securely. The blue and white box has prepaid postage on it. Please place it in the mailbox as  soon as possible. Your physician should have your test results approximately 7 days after the  monitor has been mailed back to [facility].  Call [facility] Customer Care at [phone] if you have questions regarding  your ZIO XT patch monitor. Call them immediately if you see an orange light blinking on your  monitor.  If your monitor falls off in less than 4 days, contact our Monitor department at [phone].  If your monitor becomes loose or falls off after 4 days call [employer] at [phone] for  suggestions on securing your monitor   Follow-Up: At [facility], you and your health needs are our priority.  As part of our continuing mission to provide you with exceptional heart care, our providers are all part of one team.  This team includes your primary Cardiologist (physician) and Advanced Practice Providers or APPs (Physician Assistants and Nurse Practitioners) who all work together to provide  you with the care you need, when you need it.  Your next appointment:   To be determined by monitor results

## 2024-02-09 NOTE — Progress Notes (Signed)
  Electrophysiology Office Note:   Date:  02/09/2024  ID:  Brett Franklin, DOB July 25, 1926, MRN [id number]  Primary Cardiologist: None Primary Heart Failure: None Electrophysiologist: Joelyn Lover Cortland Ding, MD      History of Present Illness:   Brett Franklin is a 89 y.o. male with h/o hypertension, CVA, diabetes, atrial fibrillation seen today for routine electrophysiology followup.   He presents today after having heart rates in the 90s at times over the last 10 days 2 weeks.  He feels weak and fatigued when this occurs.  When he is in normal rhythm he feels well.  He has been having issues with balance and has been going to physical therapy.  Prior to 2 weeks ago, he felt well without any issues.  he denies chest pain, dyspnea, PND, orthopnea, nausea, vomiting, dizziness, syncope, edema, weight gain, or early satiety.   Review of systems complete and found to be negative unless listed in HPI.   EP Information / Studies Reviewed:    EKG is ordered today. Personal review as below.  EKG Interpretation Date/Time:  Tuesday Feb 09 2024 11:53:08 EDT Ventricular Rate:  59 PR Interval:  188 QRS Duration:  104 QT Interval:  458 QTC Calculation: 453 R Axis:   88  Text Interpretation: Sinus bradycardia with sinus arrhythmia Nonspecific ST abnormality When compared with ECG of 12-Nov-2023 10:47, No significant change was found Confirmed by Zasha Belleau ([id number]) on 02/09/2024 11:55:29 AM     Risk Assessment/Calculations:    CHA2DS2-VASc Score = 6   This indicates a 9.7% annual risk of stroke. The patient's score is based upon: CHF History: 0 HTN History: 1 Diabetes History: 1 Stroke History: 2 Vascular Disease History: 0 Age Score: 2 Gender Score: 0            Physical Exam:   VS:  BP (!) 94/50 (BP Location: Right Arm, Patient Position: Sitting, Cuff Size: Normal)   Pulse (!) 59   Ht 5' 9.5" (1.765 m)   Wt 148 lb (67.1 kg)   SpO2 95%   BMI 21.54 kg/m    Wt Readings from  Last 3 Encounters:  02/09/24 148 lb (67.1 kg)  01/04/24 150 lb (68 kg)  12/01/23 149 lb (67.6 kg)     GEN: Well nourished, well developed in no acute distress NECK: No JVD; No carotid bruits CARDIAC: Regular rate and rhythm, no murmurs, rubs, gallops RESPIRATORY:  Clear to auscultation without rales, wheezing or rhonchi  ABDOMEN: Soft, non-tender, non-distended EXTREMITIES:  No edema; No deformity   ASSESSMENT AND PLAN:    1.  Paroxysmal atrial fibrillation: On Multaq .  No obvious episodes recently.  He has had some rapid heart rates.  Unclear as to the cause of this.  Akeela Busk have him wear a 2-week monitor.  2.  Secondary hypercoagulable state: On Eliquis  for atrial fibrillation  3.  Hypertension: Well-controlled  Follow up with Dr. Lawana Pray post monitoring   Signed, Spyridon Hornstein Cortland Ding, MD

## 2024-02-09 NOTE — Progress Notes (Unsigned)
 Applied a 14 day Zio XT monitor to patient in the office ?

## 2024-02-10 ENCOUNTER — Ambulatory Visit (INDEPENDENT_AMBULATORY_CARE_PROVIDER_SITE_OTHER): Admitting: Otolaryngology

## 2024-02-10 ENCOUNTER — Encounter (INDEPENDENT_AMBULATORY_CARE_PROVIDER_SITE_OTHER): Payer: Self-pay | Admitting: Otolaryngology

## 2024-02-10 VITALS — BP 146/73 | HR 87

## 2024-02-10 DIAGNOSIS — R1310 Dysphagia, unspecified: Secondary | ICD-10-CM | POA: Diagnosis not present

## 2024-02-10 DIAGNOSIS — R0981 Nasal congestion: Secondary | ICD-10-CM | POA: Diagnosis not present

## 2024-02-10 DIAGNOSIS — R0982 Postnasal drip: Secondary | ICD-10-CM

## 2024-02-10 DIAGNOSIS — H903 Sensorineural hearing loss, bilateral: Secondary | ICD-10-CM

## 2024-02-10 DIAGNOSIS — R053 Chronic cough: Secondary | ICD-10-CM

## 2024-02-10 DIAGNOSIS — R042 Hemoptysis: Secondary | ICD-10-CM

## 2024-02-10 DIAGNOSIS — H905 Unspecified sensorineural hearing loss: Secondary | ICD-10-CM | POA: Diagnosis not present

## 2024-02-10 DIAGNOSIS — R490 Dysphonia: Secondary | ICD-10-CM

## 2024-02-10 DIAGNOSIS — J383 Other diseases of vocal cords: Secondary | ICD-10-CM

## 2024-02-10 DIAGNOSIS — J343 Hypertrophy of nasal turbinates: Secondary | ICD-10-CM

## 2024-02-10 DIAGNOSIS — H9190 Unspecified hearing loss, unspecified ear: Secondary | ICD-10-CM

## 2024-02-10 DIAGNOSIS — J342 Deviated nasal septum: Secondary | ICD-10-CM

## 2024-02-10 NOTE — Progress Notes (Signed)
 ENT CONSULT:  Reason for Consult: intermittent hemoptysis (blood tinged sputum)  HPI: Discussed the use of AI scribe software for clinical note transcription with the patient, who gave verbal consent to proceed.  History of Present Illness Brett Franklin is a 89 year old male with hx of atrial fibrillation, on Eliquis , who presents with hemoptysis. He was referred by his primary care doctor for evaluation of hemoptysis.  Approximately six to eight months ago, he began experiencing episodes of hemoptysis. The blood is described as a small amount, typically occurring in the morning, mixed with phlegm and not associated with pain. The episodes are sporadic, with days to weeks between occurrences. The most recent episode occurred this morning. No chronic cough, epistaxis, or any worsening of symptoms over time.  He often coughs after eating, producing white phlegm, sometimes tinged with blood. There is no difficulty swallowing or choking while eating or drinking, but coughing after meals is noted.   He has a history of atrial fibrillation for which he has been taking Eliquis  for the past ten years. He underwent an upper endoscopy in 2011, which was normal, and an esophageal dilation was performed at that time. A chest x-ray conducted in March was normal. He has not seen a pulmonary specialist yet.  He reports nasal congestion and hearing difficulties. Wears hearing aids. A recent hearing test was conducted 2-4 months ago. Of note, he had esophagram 2013 which showed some aspiration. No swallow studies since then.    Records Reviewed:  Cards OV 02/09/24 Brett Franklin is a 89 y.o. male with h/o hypertension, CVA, diabetes, atrial fibrillation seen today for routine electrophysiology followup.    He presents today after having heart rates in the 90s at times over the last 10 days 2 weeks.  He feels weak and fatigued when this occurs.  When he is in normal rhythm he feels well.  He has been having  issues with balance and has been going to physical therapy.  Prior to 2 weeks ago, he felt well without any issues.   he denies chest pain, dyspnea, PND, orthopnea, nausea, vomiting, dizziness, syncope, edema, weight gain, or early satiety.   1.  Paroxysmal atrial fibrillation: On Multaq .  No obvious episodes recently.  He has had some rapid heart rates.  Unclear as to the cause of this.  Will have him wear a 2-week monitor.   2.  Secondary hypercoagulable state: On Eliquis  for atrial fibrillation   3.  Hypertension: Well-controlled      Past Medical History:  Diagnosis Date   Adenomatous polyp of colon 11/1996   Dr Sandrea Cruel   Anxiety    BPH (benign prostatic hyperplasia)    Cervical spinal stenosis    CVA (cerebral vascular accident) (HCC)    Diabetes mellitus, type 2 (HCC)    GERD (gastroesophageal reflux disease)    Hearing loss    Hemorrhoids    Hypertension    IBS (irritable bowel syndrome)    Melanoma (HCC)    Nephrolithiasis     X 4    Past Surgical History:  Procedure Laterality Date   CARDIOVERSION N/A 04/13/2019   Procedure: CARDIOVERSION;  Surgeon: Sheryle Donning, MD;  Location: [facility] ENDOSCOPY;  Service: Cardiovascular;  Laterality: N/A;   CATARACT EXTRACTION Left    CHOLECYSTECTOMY     COLONOSCOPY W/ POLYPECTOMY     Dr.Stark   CYSTOSCOPY     Dr.Kimbrough     Family History  Problem Relation Age of Onset  Alcohol abuse Father    Heart disease Father    Heart attack Maternal Grandfather 55   Diabetes type I Other        grandson   Cancer Neg Hx    Stroke Neg Hx     Social History:  reports that he has never smoked. He has never used smokeless tobacco. He reports that he does not drink alcohol and does not use drugs.  Allergies:  Allergies  Allergen Reactions   Lisinopril Cough    Hacking cough; he was changed to losartan     Medications: I have reviewed the patient's current medications.  The PMH, PSH, Medications, Allergies, and SH were  reviewed and updated.  ROS: Constitutional: Negative for fever, weight loss and weight gain. Cardiovascular: Negative for chest pain and dyspnea on exertion. Respiratory: Is not experiencing shortness of breath at rest. Gastrointestinal: Negative for nausea and vomiting. Neurological: Negative for headaches. Psychiatric: The patient is not nervous/anxious  Blood pressure (!) 146/73, pulse 87, SpO2 97%. There is no height or weight on file to calculate BMI.  PHYSICAL EXAM:  Exam: General: Well-developed, well-nourished Respiratory Respiratory effort: Equal inspiration and expiration without stridor Cardiovascular Peripheral Vascular: Warm extremities with equal color/perfusion Eyes: No nystagmus with equal extraocular motion bilaterally Neuro/Psych/Balance: Patient oriented to person, place, and time; Appropriate mood and affect; Gait is intact with no imbalance; Cranial nerves I-XII are intact Head and Face Inspection: Normocephalic and atraumatic without mass or lesion Palpation: Facial skeleton intact without bony stepoffs Salivary Glands: No mass or tenderness Facial Strength: Facial motility symmetric and full bilaterally ENT Pinna: External ear intact and fully developed External canal: Canal is patent with intact skin Tympanic Membrane: Clear and mobile External Nose: No scar or anatomic deformity Internal Nose: Septum is S-shaped. No polyp, or purulence. Mucosal edema and erythema present.  Bilateral inferior turbinate hypertrophy. No blood or clot or excoriated mucosa, no evidence of epistaxis.  Lips, Teeth, and gums: Mucosa and teeth intact and viable TMJ: No pain to palpation with full mobility Oral cavity/oropharynx: No erythema or exudate, no lesions present No blood or clot or trauma/evidence of prior bleeding  Nasopharynx: No mass or lesion with intact mucosa Hypopharynx: Intact mucosa without pooling of secretions Larynx Glottic: Full true vocal cord mobility  without lesion or mass. VF atrophy and glottic insufficiency.  Supraglottic: Normal appearing epiglottis and AE folds Interarytenoid Space: Moderate pachydermia&edema Subglottic Space: Patent without lesion or edema Neck Neck and Trachea: Midline trachea without mass or lesion Thyroid : No mass or nodularity Lymphatics: No lymphadenopathy  Procedure: Preoperative diagnosis: cough with meals and hemoptysis, hx of dysphagia    Postoperative diagnosis:   Same  Procedure: Flexible fiberoptic laryngoscopy  Surgeon: Artice Last, MD  Anesthesia: Topical lidocaine  and Afrin Complications: None Condition is stable throughout exam  Indications and consent:  The patient presents to the clinic with above symptoms. Indirect laryngoscopy view was incomplete. Thus it was recommended that they undergo a flexible fiberoptic laryngoscopy. All of the risks, benefits, and potential complications were reviewed with the patient preoperatively and verbal informed consent was obtained.  Procedure: The patient was seated upright in the clinic. Topical lidocaine  and Afrin were applied to the nasal cavity. After adequate anesthesia had occurred, I then proceeded to pass the flexible telescope into the nasal cavity. The nasal cavity was patent without rhinorrhea or polyp. The nasopharynx was also patent without mass or lesion. The base of tongue was visualized and was normal. There were no signs of pooling  of secretions in the piriform sinuses. The true vocal folds were mobile bilaterally. There were no signs of glottic or supraglottic mucosal lesion or mass. There was moderate interarytenoid pachydermia and post cricoid edema. The telescope was then slowly withdrawn and the patient tolerated the procedure throughout.     PROCEDURE NOTE: nasal endoscopy  Preoperative diagnosis: chronic nasal congestion symptoms  Postoperative diagnosis: same  Procedure: Diagnostic nasal endoscopy (33295)  Surgeon: Artice Last, M.D.  Anesthesia: Topical lidocaine  and Afrin  H&P REVIEW: The patient's history and physical were reviewed today prior to procedure. All medications were reviewed and updated as well. Complications: None Condition is stable throughout exam Indications and consent: The patient presents with symptoms of chronic sinusitis not responding to previous therapies. All the risks, benefits, and potential complications were reviewed with the patient preoperatively and informed consent was obtained. The time out was completed with confirmation of the correct procedure.   Procedure: The patient was seated upright in the clinic. Topical lidocaine  and Afrin were applied to the nasal cavity. After adequate anesthesia had occurred, the rigid nasal endoscope was passed into the nasal cavity. The nasal mucosa, turbinates, septum, and sinus drainage pathways were visualized bilaterally. This revealed no purulence or significant secretions that might be cultured. There were no polyps or sites of significant inflammation. The mucosa was intact and there no crusting present. No signs of active epistaxis, no blood or clot. The scope was then slowly withdrawn and the patient tolerated the procedure well. There were no complications or blood loss.   Studies Reviewed: 12/01/23 Chronic cough and intermittent hemoptysis  FINDINGS: The heart size and mediastinal contours are within normal limits. Both lungs are clear. The visualized skeletal structures are unremarkable.   IMPRESSION: No active cardiopulmonary disease.  Esophagram 07/14/12 Findings:  Multilevel degenerative changes of the cervical spine  most pronounced at C6-7.  Following contrast ingestion of thin  barium, aspiration occurred.  This elicited a cough reflex.  Following ingestion of effervescent crystals and thick barium, the  esophageal mucosa appears smooth.  No ulceration or mass  identified. No hernia or diverticulum. Tertiary  contractions  occurred with moderate frequency.  Gastroesophageal reflux occurred  to the level of the cervical esophagus.  A barium-coated tablet  passed into the stomach without delay.   IMPRESSION:  Gastroesophageal reflux.   Tertiary contractions/esophageal dysmotility with moderate  frequency.   Aspiration occurred with the initial swallow however was not  subsequently observed.  This may have been exacerbated by  positioning (when the patient had his chin elevated). Aspiration  elicited a cough reflex.   EGD 2011   Assessment/Plan: Encounter Diagnoses  Name Primary?   Hemoptysis Yes   Hard of hearing    Dysphagia, unspecified type    Nasal septal deviation    Chronic nasal congestion    Post-nasal drip    Glottic insufficiency    Age-related vocal fold atrophy    Hoarseness    Chronic cough    Hypertrophy of both inferior nasal turbinates    Sensorineural hearing loss (SNHL) of both ears     Assessment and Plan Assessment & Plan Hemoptysis Intermittent hemoptysis for 6-8 months, primarily in the morning, low-volume and mixed with mucus. No upper airway bleeding sources identified during exam including nasal endoscopy and flexible laryngoscopy. No signs of epistaxis and no lesions to explain hemoptysis. Differential diagnosis includes pulmonary or gastrointestinal sources. He is at risk for epistaxis 2/2 being on Eliquis  but nasal endoscopy without any signs  of epistaxis even though he had hemoptysis this morning. Previous chest x-ray was normal.  - Refer to pulmonary specialist for evaluation of potential pulmonary bleeding sources. - if Pulmonary workup of hemoptysis is unrevealing, will need to see GI   Dysphagia coughing after meals  Last esophagram 2013 was with some aspiration. Flex scope today without pooling of secretions but he did have VF atrophy and glottic insufficiency. Need repeat swallow study to rule out aspiration and penetration as a source of his  coughing.  - Order repeat swallow study to assess for aspiration and swallowing difficulties (MBS/esophagram)  Chronic nasal congestion  Nasal endoscopy with narrowing of b/l nasal passages and S-shaped septum.  - Advised saline nasal spray to reduce nasal congestion.  Atrial fibrillation Chronic atrial fibrillation managed with Eliquis  for 10 years. Eliquis  may increase minor bleeding risk  SNHL hearing aid dependent  - annual hearing test - just had one recently   F/u  MBS esophagram Pulm referral  RTC 2 mo  Thank you for allowing me to participate in the care of this patient. Please do not hesitate to contact me with any questions or concerns.   Artice Last, MD Otolaryngology [facility] Phone: [phone] Fax: [phone]    02/10/2024, 5:23 PM

## 2024-02-11 ENCOUNTER — Other Ambulatory Visit (HOSPITAL_COMMUNITY): Payer: Self-pay | Admitting: Otolaryngology

## 2024-02-11 DIAGNOSIS — R059 Cough, unspecified: Secondary | ICD-10-CM

## 2024-02-11 DIAGNOSIS — R1310 Dysphagia, unspecified: Secondary | ICD-10-CM

## 2024-02-23 ENCOUNTER — Encounter: Payer: Self-pay | Admitting: Podiatry

## 2024-02-23 ENCOUNTER — Ambulatory Visit (INDEPENDENT_AMBULATORY_CARE_PROVIDER_SITE_OTHER): Admitting: Podiatry

## 2024-02-23 DIAGNOSIS — D2372 Other benign neoplasm of skin of left lower limb, including hip: Secondary | ICD-10-CM | POA: Diagnosis not present

## 2024-02-23 DIAGNOSIS — B351 Tinea unguium: Secondary | ICD-10-CM

## 2024-02-23 DIAGNOSIS — M79676 Pain in unspecified toe(s): Secondary | ICD-10-CM | POA: Diagnosis not present

## 2024-02-24 NOTE — Progress Notes (Signed)
 He presents today with chief complaint of painful calluses bilaterally painful elongated toenails.  Incurvated margins.  Objective: Vital signs are stable he is alert and oriented x 3.  Pulses are palpable no open lesions or wounds.  Benign hyperkeratotic lesion plantar aspect of the bilateral foot particularly beneath the fifth metatarsal heads bilaterally.  Toenails are thick and slightly elongated and tender on palpation extremities: Debridement.  Assessment: Pain in limb secondary to benign skin lesions and ingrown toenails.  Plan: Debridement of benign skin lesions debridement of toenails 1 through 5 bilateral, toes.

## 2024-02-28 ENCOUNTER — Ambulatory Visit: Payer: Self-pay | Admitting: Cardiology

## 2024-02-28 DIAGNOSIS — I4819 Other persistent atrial fibrillation: Secondary | ICD-10-CM

## 2024-02-28 DIAGNOSIS — I4892 Unspecified atrial flutter: Secondary | ICD-10-CM | POA: Diagnosis not present

## 2024-03-01 ENCOUNTER — Ambulatory Visit (HOSPITAL_COMMUNITY)
Admission: RE | Admit: 2024-03-01 | Discharge: 2024-03-01 | Disposition: A | Discharge status: Home / Self Care | Source: Ambulatory Visit | Attending: Internal Medicine | Admitting: Internal Medicine

## 2024-03-01 ENCOUNTER — Ambulatory Visit (HOSPITAL_COMMUNITY)
Admission: RE | Admit: 2024-03-01 | Discharge: 2024-03-01 | Disposition: A | Discharge status: Home / Self Care | Source: Ambulatory Visit | Attending: Otolaryngology | Admitting: Otolaryngology

## 2024-03-01 DIAGNOSIS — R042 Hemoptysis: Secondary | ICD-10-CM | POA: Diagnosis not present

## 2024-03-01 DIAGNOSIS — I4891 Unspecified atrial fibrillation: Secondary | ICD-10-CM | POA: Insufficient documentation

## 2024-03-01 DIAGNOSIS — R1310 Dysphagia, unspecified: Secondary | ICD-10-CM | POA: Diagnosis present

## 2024-03-01 DIAGNOSIS — E119 Type 2 diabetes mellitus without complications: Secondary | ICD-10-CM | POA: Diagnosis not present

## 2024-03-01 DIAGNOSIS — R1314 Dysphagia, pharyngoesophageal phase: Secondary | ICD-10-CM | POA: Insufficient documentation

## 2024-03-01 DIAGNOSIS — R059 Cough, unspecified: Secondary | ICD-10-CM

## 2024-03-01 DIAGNOSIS — K224 Dyskinesia of esophagus: Secondary | ICD-10-CM | POA: Diagnosis not present

## 2024-03-01 DIAGNOSIS — Z8673 Personal history of transient ischemic attack (TIA), and cerebral infarction without residual deficits: Secondary | ICD-10-CM | POA: Insufficient documentation

## 2024-03-01 DIAGNOSIS — I10 Essential (primary) hypertension: Secondary | ICD-10-CM | POA: Insufficient documentation

## 2024-03-01 DIAGNOSIS — K219 Gastro-esophageal reflux disease without esophagitis: Secondary | ICD-10-CM | POA: Insufficient documentation

## 2024-03-01 NOTE — Therapy (Signed)
 Modified Barium Swallow Study  Patient Details  Name: ANDRU GENTER MRN: [id number] Date of Birth: August 11, 1926  Today's Date: 03/01/2024  Modified Barium Swallow completed.  Full report located under Chart Review in the Imaging Section.  History of Present Illness Shad Ledvina is a 89 y.o. male with PMH: HTN, CVA, DM, a-fib, GERD. He presented to this OP MBS from referral following ENT appointment for intermittent hemoptysis. He reportedly coughs after eating, producing white phlegm which is sometimes tinged with blood. While sitting in radiology suite prior to starting this MBS, SLP spoke with him about his concerns. Overall he says he feels fine, he denies swallowing difficulties, denies any recent PNA's, difficulty breathing, fevers, denies any loss of appetite or PO intake, denies any pain from coughing. He told SLP that coughing up blood has been "sporadic" and started about 6 months ago.   Clinical Impression Patient presents with a pharyngoesophageal dysphagia as per this MBS with suspected impact from structural changes (suspected cervical osteophytes and cricopharyngeal bar) and mistiming of laryngeal closure.  He exhibited adequate anterior hyoid excursion and laryngeal closure, however thin liquid transited past partially closed epiglottis into laryngeal vestibule with thin liquids. This lead to penetration to the vocal cords which did not clear (PAS 5 ) as well as trace silent aspiration (PAS 8) just below vocal cords. No penetration or aspiration occured with nectar thick, honey thick, puree or solid barium textures. At end of study, SLP cued Mr. Thomure to perform a cough which was effective to clear aspirate from trachea and laryngeal vestibule. Chin tuck posture with thin liquids did prevent aspiration and reduce depth of penetration but not penetration at the beginning of the study but towards the end when chin tuck tried again, patient with penetration to the vocal cords. SLP discussed  results with patient and recommendation that he periodically perform a protective cough after swallows of liquids to help clear likely penetrated and aspirated PO's. Factors that may increase risk of adverse event in presence of aspiration Roderick Civatte & Jessy Morocco 2021): Frail or deconditioned  Swallow Evaluation Recommendations Recommendations: PO diet PO Diet Recommendation: Regular;Thin liquids (Level 0) Liquid Administration via: Cup Medication Administration: Whole meds with puree Supervision: Patient able to self-feed Swallowing strategies  : Clear throat intermittently;Hard cough after swallowing Postural changes: Position pt fully upright for meals;Stay upright 30-60 min after meals Oral care recommendations: Oral care BID (2x/day)    Jacqualine Mater, MA, CCC-SLP Speech Therapy

## 2024-03-04 ENCOUNTER — Telehealth: Payer: Self-pay | Admitting: *Deleted

## 2024-03-04 ENCOUNTER — Other Ambulatory Visit (HOSPITAL_COMMUNITY): Payer: Self-pay

## 2024-03-04 MED ORDER — DILTIAZEM HCL ER COATED BEADS 240 MG PO CP24
240.0000 mg | ORAL_CAPSULE | Freq: Every day | ORAL | 1 refills | Status: DC
  Filled 2024-03-04: qty 90, 90d supply, fill #0

## 2024-03-04 NOTE — Telephone Encounter (Signed)
-----   Message from Will [facility] sent at 02/28/2024  2:01 PM EDT ----- Elevated HR flutter vs svt. Increase diltiazem  to 240 mg, if continued symptoms may  need to switch to amio from multaq

## 2024-03-04 NOTE — Telephone Encounter (Signed)
----   Message from Will [facility] sent at 02/28/2024  2:01 PM EDT ----- Elevated HR flutter vs svt. Increase diltiazem  to 240 mg, if continued symptoms may  need to switch to amio from multaq    I called and spoke with wife, dpr on file.  Advised of findings and MD recommendation.  Agreeable to plan.  Advised to call the office with any issues after increasing the medication.  Wife is agreeable to plan.

## 2024-03-10 ENCOUNTER — Other Ambulatory Visit (HOSPITAL_COMMUNITY): Payer: Self-pay

## 2024-03-10 MED ORDER — DILTIAZEM HCL ER COATED BEADS 240 MG PO CP24
240.0000 mg | ORAL_CAPSULE | Freq: Every day | ORAL | 1 refills | Status: DC
Start: 2024-03-10 — End: 2024-08-02

## 2024-03-10 NOTE — Telephone Encounter (Signed)
 Question from Patsy (dpr)- been taking the new medication and it is working well. Asked for prescription to be sent in for the new dose that he has been taking.   The prescription was originally sent to [facility] pharmacy, but they do not use that pharmacy. She asked for a new rx to be sent to [facility]. RX sent.   Hr- been 50-60's   She is wondering when next appt is needed. They do not want to be seen at the Af[facility] clinic- would rather be seen here at this office.

## 2024-03-10 NOTE — Addendum Note (Signed)
 Addended by: Ellerie Arenz L on: 03/10/2024 11:50 AM   Modules accepted: Orders

## 2024-03-10 NOTE — Telephone Encounter (Signed)
 Attempted to reach wife/pt at both numbers listed.  Home number busy signal on several attempts.  Cell number unable to leave a message, voicemail full.

## 2024-03-10 NOTE — Telephone Encounter (Signed)
 Patient would like to speak with a nurse in regard to the medication increase for the patient. Please advise.

## 2024-03-23 NOTE — Telephone Encounter (Signed)
 Left message to call back

## 2024-03-28 ENCOUNTER — Ambulatory Visit

## 2024-03-28 ENCOUNTER — Telehealth: Payer: Self-pay | Admitting: Cardiology

## 2024-03-28 NOTE — Telephone Encounter (Signed)
 Wife stated patient has been out of town and they are now returning RN's call.

## 2024-03-29 LAB — HM DIABETES EYE EXAM

## 2024-03-29 NOTE — Telephone Encounter (Signed)
 Aware pt should follow up in August, 3 months after medication change. Aware scheduler will contact them to arrange OV w/ Camnitz (they only want to see him).  Wife aware office will contact them

## 2024-04-02 ENCOUNTER — Other Ambulatory Visit: Payer: Self-pay | Admitting: Internal Medicine

## 2024-04-18 ENCOUNTER — Encounter: Payer: Self-pay | Admitting: Internal Medicine

## 2024-04-18 ENCOUNTER — Other Ambulatory Visit: Payer: Self-pay | Admitting: Cardiology

## 2024-04-18 NOTE — Patient Instructions (Addendum)
      Medications changes include :   tylenol  #3 for pain as needed       Return in about 3 months (around 07/20/2024) for follow up.

## 2024-04-18 NOTE — Progress Notes (Signed)
 Subjective:    Patient ID: Brett Franklin, male    DOB: May 30, 1926, 89 y.o.   MRN: [id number]     HPI Brett Franklin is here for follow up of his chronic medical problems.  Cough is slightly better.  Dry cough often associated with eating.  Has not had any hemoptysis or productivity of the cough recently.  Has seen ENT-swallow evaluation did show some esophageal dysmotility.  He was referred to pulmonary and has an appointment scheduled.  Having increased back pain - would like to have some tylenol  #3 for prn.   Medications and allergies reviewed with patient and updated if appropriate.  Current Outpatient Medications on File Prior to Visit  Medication Sig Dispense Refill   Accu-Chek Softclix Lancets lancets FOLLOW PACKAGE DIRECTIONS TO CHECK BLOOD SUGARS DAILY 100 each 1   blood glucose meter kit and supplies KIT Dispense based on patient and insurance preference. Use to text blood sugars daily. 1 each 0   diltiazem  (CARDIZEM  CD) 240 MG 24 hr capsule Take 1 capsule (240 mg total) by mouth daily. 90 capsule 1   diltiazem  (CARDIZEM ) 30 MG tablet TAKE 1 TABLET BY MOUTH DAILY AS NEEDED 90 tablet 2   ELIQUIS  5 MG TABS tablet TAKE 1 TABLET(5 MG) BY MOUTH TWICE DAILY 180 tablet 3   glucose blood test strip Use to test blood sugars once daily. Dx code-E11.49 100 each 3   JANUMET  50-500 MG tablet TAKE 1 TABLET BY MOUTH DAILY. 90 tablet 1   levothyroxine  (SYNTHROID ) 50 MCG tablet TAKE 1 TABLET(50 MCG) BY MOUTH DAILY 90 tablet 3   metoprolol  tartrate (LOPRESSOR ) 25 MG tablet Take 25 mg by mouth 2 (two) times daily.     mometasone (NASONEX) 50 MCG/ACT nasal spray SMARTSIG:2 Spray(s) Both Nares Every Night     MULTAQ  400 MG tablet TAKE 1 TABLET(400 MG) BY MOUTH TWICE DAILY WITH A MEAL. 60 tablet 5   mupirocin  ointment (BACTROBAN ) 2 % Apply to wound with dressing changes 30 g 1   polyethylene glycol (MIRALAX / GLYCOLAX) 17 g packet Take 17 g by mouth daily.     pravastatin  (PRAVACHOL ) 20 MG tablet  TAKE 1 TABLET(20 MG) BY MOUTH DAILY 90 tablet 1   No current facility-administered medications on file prior to visit.     Review of Systems  Constitutional:  Negative for fever.  Respiratory:  Positive for cough (improved - dry - with eating). Negative for shortness of breath and wheezing.   Cardiovascular:  Negative for chest pain, palpitations and leg swelling.  Musculoskeletal:  Positive for back pain.  Neurological:  Negative for dizziness, light-headedness and headaches.       Objective:   Vitals:   04/19/24 0948  BP: 130/76  Pulse: 69  Temp: 98 F (36.7 C)  SpO2: 99%   BP Readings from Last 3 Encounters:  04/19/24 130/76  02/10/24 (!) 146/73  02/09/24 (!) 94/50   Wt Readings from Last 3 Encounters:  04/19/24 150 lb (68 kg)  02/09/24 148 lb (67.1 kg)  01/04/24 150 lb (68 kg)   Body mass index is 21.83 kg/m.    Physical Exam Constitutional:      General: He is not in acute distress.    Appearance: Normal appearance. He is not ill-appearing.  HENT:     Head: Normocephalic and atraumatic.  Eyes:     Conjunctiva/sclera: Conjunctivae normal.  Cardiovascular:     Rate and Rhythm: Normal rate and regular rhythm.  Heart sounds: Normal heart sounds.  Pulmonary:     Effort: Pulmonary effort is normal. No respiratory distress.     Breath sounds: Normal breath sounds. No wheezing or rales.  Musculoskeletal:     Right lower leg: No edema.     Left lower leg: No edema.  Skin:    General: Skin is warm and dry.     Findings: No rash.  Neurological:     Mental Status: He is alert. Mental status is at baseline.  Psychiatric:        Mood and Affect: Mood normal.        Lab Results  Component Value Date   WBC 9.7 01/04/2024   HGB 13.8 01/04/2024   HCT 40.6 01/04/2024   PLT 202.0 01/04/2024   GLUCOSE 126 (H) 01/04/2024   CHOL 93 01/04/2024   TRIG 55.0 01/04/2024   HDL 55.60 01/04/2024   LDLCALC 26 01/04/2024   ALT 14 01/04/2024   AST 17 01/04/2024    NA 140 01/04/2024   K 4.5 01/04/2024   CL 104 01/04/2024   CREATININE 1.29 01/04/2024   BUN 27 (H) 01/04/2024   CO2 26 01/04/2024   TSH 3.57 01/04/2024   INR 0.99 12/21/2009   HGBA1C 6.5 01/04/2024   MICROALBUR 1.0 04/11/2010     Assessment & Plan:    See Problem List for Assessment and Plan of chronic medical problems.

## 2024-04-19 ENCOUNTER — Ambulatory Visit (INDEPENDENT_AMBULATORY_CARE_PROVIDER_SITE_OTHER): Admitting: Internal Medicine

## 2024-04-19 VITALS — BP 130/76 | HR 69 | Temp 98.0°F | Ht 69.5 in | Wt 150.0 lb

## 2024-04-19 DIAGNOSIS — E1149 Type 2 diabetes mellitus with other diabetic neurological complication: Secondary | ICD-10-CM

## 2024-04-19 DIAGNOSIS — I10 Essential (primary) hypertension: Secondary | ICD-10-CM | POA: Diagnosis not present

## 2024-04-19 DIAGNOSIS — M545 Low back pain, unspecified: Secondary | ICD-10-CM

## 2024-04-19 DIAGNOSIS — M542 Cervicalgia: Secondary | ICD-10-CM

## 2024-04-19 DIAGNOSIS — E039 Hypothyroidism, unspecified: Secondary | ICD-10-CM

## 2024-04-19 DIAGNOSIS — K224 Dyskinesia of esophagus: Secondary | ICD-10-CM | POA: Insufficient documentation

## 2024-04-19 DIAGNOSIS — I480 Paroxysmal atrial fibrillation: Secondary | ICD-10-CM

## 2024-04-19 DIAGNOSIS — G8929 Other chronic pain: Secondary | ICD-10-CM

## 2024-04-19 DIAGNOSIS — E7849 Other hyperlipidemia: Secondary | ICD-10-CM

## 2024-04-19 DIAGNOSIS — R053 Chronic cough: Secondary | ICD-10-CM

## 2024-04-19 DIAGNOSIS — F419 Anxiety disorder, unspecified: Secondary | ICD-10-CM

## 2024-04-19 MED ORDER — ACETAMINOPHEN-CODEINE 300-30 MG PO TABS: 1.0000 | ORAL_TABLET | Freq: Three times a day (TID) | ORAL | 0 refills | Status: AC | PRN

## 2024-04-19 MED ORDER — LORAZEPAM 0.5 MG PO TABS: ORAL_TABLET | ORAL | 5 refills | Status: AC

## 2024-04-19 NOTE — Assessment & Plan Note (Signed)
 Chronic Regular exercise and healthy diet encouraged Lab Results  Component Value Date   LDLCALC 26 01/04/2024   Continue pravastatin  20 mg daily

## 2024-04-19 NOTE — Assessment & Plan Note (Signed)
 Chronic  Clinically euthyroid TSH 3 months ago in normal range Continue levothyroxine  50 mcg daily

## 2024-04-19 NOTE — Assessment & Plan Note (Addendum)
 Chronic Follows with cardiology On Eliquis  5 mg twice daily, Multaq  400 mg twice daily, diltiazem  240 mg daily, metoprolol  25 mg daily Has diltiazem  30 mg prn for episodes of A-fib if needed

## 2024-04-19 NOTE — Assessment & Plan Note (Signed)
 Chronic Intermittent - having increased lower back pain recently At 1 point was taking Tylenol  3 on occasion for neck/back pain and wondered about having a refill Will refill Tylenol  3-1 tab every 8 hours as needed-uses infrequently and tolerates medication well For more mild pain will take Tylenol  only

## 2024-04-19 NOTE — Assessment & Plan Note (Signed)
 Chronic Blood pressure well controlled Continue diltiazem  240 mg daily, metoprolol  25 mg twice daily

## 2024-04-19 NOTE — Assessment & Plan Note (Signed)
 Chronic Often coughs after he eats dinner and he will bring up a lot of clear phlegm. Cough can last 1-2 minutes He occasionally will have a very small amount of blood in it. Chest x-ray last month-lungs were clear Has seen ENT Had swallow evaluation-there was evidence of esophageal dysmotility Referred to pulmonary and has a scheduled appointment Cough is improved a little-just dry at this point-no recent hemoptysis or productivity Advised caution with swallowing

## 2024-04-19 NOTE — Assessment & Plan Note (Signed)
 Chronic Controlled, Stable Continue lorazepam  0.5 mg twice daily as needed-takes infrequently

## 2024-04-19 NOTE — Assessment & Plan Note (Signed)
 Chronic Intermittent Continue Tylenol as needed

## 2024-04-19 NOTE — Assessment & Plan Note (Signed)
 Chronic  Lab Results  Component Value Date   HGBA1C 6.5 01/04/2024   Sugars well controlled Continue Janumet  50-500 mg daily Continue regular exercise, diabetic diet

## 2024-04-19 NOTE — Assessment & Plan Note (Signed)
 Chronic cough-often associated with eating Did have swallow evaluation and saw ENT and there was some evidence of esophageal dysmotility which is likely the cause of the cough There has been some improvement in cough Advised caution with eating

## 2024-04-22 ENCOUNTER — Telehealth (INDEPENDENT_AMBULATORY_CARE_PROVIDER_SITE_OTHER): Payer: Self-pay | Admitting: Otolaryngology

## 2024-04-22 NOTE — Telephone Encounter (Signed)
 04/22/24 Notified w/correct location [address]

## 2024-04-26 ENCOUNTER — Ambulatory Visit (INDEPENDENT_AMBULATORY_CARE_PROVIDER_SITE_OTHER): Admitting: Podiatry

## 2024-04-26 ENCOUNTER — Encounter (INDEPENDENT_AMBULATORY_CARE_PROVIDER_SITE_OTHER): Payer: Self-pay | Admitting: Otolaryngology

## 2024-04-26 ENCOUNTER — Ambulatory Visit (INDEPENDENT_AMBULATORY_CARE_PROVIDER_SITE_OTHER): Admitting: Otolaryngology

## 2024-04-26 ENCOUNTER — Encounter: Payer: Self-pay | Admitting: Podiatry

## 2024-04-26 VITALS — BP 148/68 | HR 69

## 2024-04-26 DIAGNOSIS — R1314 Dysphagia, pharyngoesophageal phase: Secondary | ICD-10-CM | POA: Diagnosis not present

## 2024-04-26 DIAGNOSIS — M79676 Pain in unspecified toe(s): Secondary | ICD-10-CM | POA: Diagnosis not present

## 2024-04-26 DIAGNOSIS — R0981 Nasal congestion: Secondary | ICD-10-CM

## 2024-04-26 DIAGNOSIS — J383 Other diseases of vocal cords: Secondary | ICD-10-CM

## 2024-04-26 DIAGNOSIS — R490 Dysphonia: Secondary | ICD-10-CM

## 2024-04-26 DIAGNOSIS — I4891 Unspecified atrial fibrillation: Secondary | ICD-10-CM

## 2024-04-26 DIAGNOSIS — R0982 Postnasal drip: Secondary | ICD-10-CM

## 2024-04-26 DIAGNOSIS — D2372 Other benign neoplasm of skin of left lower limb, including hip: Secondary | ICD-10-CM

## 2024-04-26 DIAGNOSIS — B351 Tinea unguium: Secondary | ICD-10-CM | POA: Diagnosis not present

## 2024-04-26 DIAGNOSIS — E1142 Type 2 diabetes mellitus with diabetic polyneuropathy: Secondary | ICD-10-CM | POA: Diagnosis not present

## 2024-04-26 DIAGNOSIS — R1310 Dysphagia, unspecified: Secondary | ICD-10-CM

## 2024-04-26 DIAGNOSIS — D689 Coagulation defect, unspecified: Secondary | ICD-10-CM | POA: Diagnosis not present

## 2024-04-26 DIAGNOSIS — D2371 Other benign neoplasm of skin of right lower limb, including hip: Secondary | ICD-10-CM | POA: Diagnosis not present

## 2024-04-26 DIAGNOSIS — R042 Hemoptysis: Secondary | ICD-10-CM

## 2024-04-26 DIAGNOSIS — H903 Sensorineural hearing loss, bilateral: Secondary | ICD-10-CM

## 2024-04-26 NOTE — Progress Notes (Signed)
 ENT Progress Note:   Update 04/26/2024  Discussed the use of AI scribe software for clinical note transcription with the patient, who gave verbal consent to proceed.  History of Present Illness  Brett Franklin is a 89 year old male who presents with swallowing difficulties and episodes of spitting out blood.  He experiences swallowing difficulties. Swallow evaluation showed esophageal dysmotility and a benign osteophyte growth along the spine/pharyngeal dysphagia with trace aspiration of thins. Food feels like it is going down, he denies modifying his diet and reports he is doing well with swallowing. Not interested in swallow therapy.  He had a couple of mild episodes of phlegm that was blood tinged. These episodes have decreased in frequency and severity since his last visit.  He has noticed a reduction in the amount of phlegm he coughs up after eating, which was previously frequent. He is scheduled to see Pulm next month.  Records Reviewed:  Initial Evaluation  Reason for Consult: intermittent hemoptysis (blood tinged sputum)  HPI: Discussed the use of AI scribe software for clinical note transcription with the patient, who gave verbal consent to proceed.  History of Present Illness Brett Franklin is a 89 year old male with hx of atrial fibrillation, on Eliquis , who presents with hemoptysis. He was referred by his primary care doctor for evaluation of hemoptysis.  Approximately six to eight months ago, he began experiencing episodes of hemoptysis. The blood is described as a small amount, typically occurring in the morning, mixed with phlegm and not associated with pain. The episodes are sporadic, with days to weeks between occurrences. The most recent episode occurred this morning. No chronic cough, epistaxis, or any worsening of symptoms over time.  He often coughs after eating, producing white phlegm, sometimes tinged with blood. There is no difficulty swallowing or choking while  eating or drinking, but coughing after meals is noted.   He has a history of atrial fibrillation for which he has been taking Eliquis  for the past ten years. He underwent an upper endoscopy in 2011, which was normal, and an esophageal dilation was performed at that time. A chest x-ray conducted in March was normal. He has not seen a pulmonary specialist yet.  He reports nasal congestion and hearing difficulties. Wears hearing aids. A recent hearing test was conducted 2-4 months ago. Of note, he had esophagram 2013 which showed some aspiration. No swallow studies since then.     Cards OV 2024/02/16 Brett Franklin is a 89 y.o. male with h/o hypertension, CVA, diabetes, atrial fibrillation seen today for routine electrophysiology followup.    He presents today after having heart rates in the 90s at times over the last 10 days 2 weeks.  He feels weak and fatigued when this occurs.  When he is in normal rhythm he feels well.  He has been having issues with balance and has been going to physical therapy.  Prior to 2 weeks ago, he felt well without any issues.   he denies chest pain, dyspnea, PND, orthopnea, nausea, vomiting, dizziness, syncope, edema, weight gain, or early satiety.   1.  Paroxysmal atrial fibrillation: On Multaq .  No obvious episodes recently.  He has had some rapid heart rates.  Unclear as to the cause of this.  Will have him wear a 2-week monitor.   2.  Secondary hypercoagulable state: On Eliquis  for atrial fibrillation   3.  Hypertension: Well-controlled      Past Medical History:  Diagnosis Date   Adenomatous  polyp of colon 11/1996   Dr Aneita   Anxiety    BPH (benign prostatic hyperplasia)    Cervical spinal stenosis    CVA (cerebral vascular accident) (HCC)    Diabetes mellitus, type 2 (HCC)    GERD (gastroesophageal reflux disease)    Hearing loss    Hemorrhoids    Hypertension    IBS (irritable bowel syndrome)    Melanoma (HCC)    Nephrolithiasis     X 4     Past Surgical History:  Procedure Laterality Date   CARDIOVERSION N/A 04/13/2019   Procedure: CARDIOVERSION;  Surgeon: Lonni Slain, MD;  Location: [facility] ENDOSCOPY;  Service: Cardiovascular;  Laterality: N/A;   CATARACT EXTRACTION Left    CHOLECYSTECTOMY     COLONOSCOPY W/ POLYPECTOMY     Dr.Stark   CYSTOSCOPY     Dr.Kimbrough     Family History  Problem Relation Age of Onset   Alcohol abuse Father    Heart disease Father    Heart attack Maternal Grandfather 22   Diabetes type I Other        grandson   Cancer Neg Hx    Stroke Neg Hx     Social History:  reports that he has never smoked. He has never used smokeless tobacco. He reports that he does not drink alcohol and does not use drugs.  Allergies:  Allergies  Allergen Reactions   Lisinopril Cough    Hacking cough; he was changed to losartan     Medications: I have reviewed the patient's current medications.  The PMH, PSH, Medications, Allergies, and SH were reviewed and updated.  ROS: Constitutional: Negative for fever, weight loss and weight gain. Cardiovascular: Negative for chest pain and dyspnea on exertion. Respiratory: Is not experiencing shortness of breath at rest. Gastrointestinal: Negative for nausea and vomiting. Neurological: Negative for headaches. Psychiatric: The patient is not nervous/anxious  Blood pressure (!) 148/68, pulse 69, SpO2 97%. There is no height or weight on file to calculate BMI.  PHYSICAL EXAM:  Exam: General: Well-developed, well-nourished Respiratory Respiratory effort: Equal inspiration and expiration without stridor Cardiovascular Peripheral Vascular: Warm extremities with equal color/perfusion Eyes: No nystagmus with equal extraocular motion bilaterally Neuro/Psych/Balance: Patient oriented to person, place, and time; Appropriate mood and affect; Gait is intact with no imbalance; Cranial nerves I-XII are intact Head and Face Inspection: Normocephalic and  atraumatic without mass or lesion Palpation: Facial skeleton intact without bony stepoffs Salivary Glands: No mass or tenderness Facial Strength: Facial motility symmetric and full bilaterally ENT Pinna: External ear intact and fully developed External canal: Canal is patent with intact skin Tympanic Membrane: Clear and mobile External Nose: No scar or anatomic deformity Internal Nose: Septum is S-shaped. No polyp, or purulence. Mucosal edema and erythema present.  Bilateral inferior turbinate hypertrophy. No blood or clot or excoriated mucosa, no evidence of epistaxis.  Lips, Teeth, and gums: Mucosa and teeth intact and viable TMJ: No pain to palpation with full mobility Oral cavity/oropharynx: No erythema or exudate, no lesions present No blood or clot or trauma/evidence of prior bleeding  Neck Neck and Trachea: Midline trachea without mass or lesion Thyroid : No mass or nodularity Lymphatics: No lymphadenopathy  Studies Reviewed: 12/01/23 Chronic cough and intermittent hemoptysis  FINDINGS: The heart size and mediastinal contours are within normal limits. Both lungs are clear. The visualized skeletal structures are unremarkable.   IMPRESSION: No active cardiopulmonary disease.  Esophagram 07/14/12 Findings:  Multilevel degenerative changes of the cervical spine  most pronounced at  C6-7.  Following contrast ingestion of thin  barium, aspiration occurred.  This elicited a cough reflex.  Following ingestion of effervescent crystals and thick barium, the  esophageal mucosa appears smooth.  No ulceration or mass  identified. No hernia or diverticulum. Tertiary contractions  occurred with moderate frequency.  Gastroesophageal reflux occurred  to the level of the cervical esophagus.  A barium-coated tablet  passed into the stomach without delay.   IMPRESSION:  Gastroesophageal reflux.   Tertiary contractions/esophageal dysmotility with moderate  frequency.   Aspiration occurred  with the initial swallow however was not  subsequently observed.  This may have been exacerbated by  positioning (when the patient had his chin elevated). Aspiration  elicited a cough reflex.   EGD 2011   MBS 03/01/24 Clinical Impression: Patient presents with a pharyngoesophageal dysphagia as per this MBS with suspected impact from structural changes (suspected cervical osteophytes and cricopharyngeal bar) and mistiming of laryngeal closure. He exhibited adequate anterior hyoid excursion and laryngeal closure, however thin liquid transited past partially closed epiglottis into laryngeal vestibule with thin liquids. This lead to penetration to the vocal cords which did not clear (PAS 5 ) as well as trace silent aspiration (PAS 8) just below vocal cords. No penetration or aspiration occured with nectar thick, honey thick, puree or solid barium textures. At end of study, SLP cued Mr. Molina to perform a cough which was effective to clear aspirate from trachea and laryngeal vestibule. Chin tuck posture with thin liquids did prevent aspiration and reduce depth of penetration but not penetration at the beginning of the study but towards the end when chin tuck tried again, patient with penetration to the vocal cords. SLP discussed results with patient and recommendation that he periodically perform a protective cough after swallows of liquids to help clear likely penetrated and aspirated PO's.   Esophagram 03/01/24 Narrative & Impression  .  CLINICAL DATA: 89 year old male with dysphagia, hemoptysis. Barium swallow requested.   EXAM: ESOPHAGUS/BARIUM SWALLOW/TABLET STUDY   TECHNIQUE: Single contrast examination was performed using thin liquid barium. This exam was performed by Solmon Ku PA-C, and was supervised and interpreted by Rockey Kilts, MD.   FLUOROSCOPY: Radiation Exposure Index (as provided by the fluoroscopic device): 5.5 mGy Kerma   COMPARISON:  None Available.   FINDINGS: Exam  was performed upright, as the patient had demonstrated aspiration on modified swallow. No gross mucosal abnormality. No tight esophageal stricture Esophageal dysmotility, as evidenced by tertiary contractions and contrast stasis throughout the esophagus.   Initial delay of passage of a 13 mm barium tablet in the pharynx. Subsequent passage into the stomach.   IMPRESSION: 1. Limited barium swallow in the setting of advanced age, mobility, and aspiration.   2. Esophageal dysmotility evidenced by poor primary stripping wave and tertiary contractions.     Assessment/Plan: Encounter Diagnoses  Name Primary?   Hemoptysis Yes   Dysphagia, unspecified type    Glottic insufficiency    Age-related vocal fold atrophy    Hoarseness    Post-nasal drip    Chronic nasal congestion    Sensorineural hearing loss (SNHL) of both ears      Assessment and Plan Assessment & Plan Hemoptysis Intermittent hemoptysis for 6-8 months, primarily in the morning, low-volume and mixed with mucus. No upper airway bleeding sources identified during exam including nasal endoscopy and flexible laryngoscopy. No signs of epistaxis and no lesions to explain hemoptysis. Differential diagnosis includes pulmonary or gastrointestinal sources. He is at risk for epistaxis  2/2 being on Eliquis  but nasal endoscopy without any signs of epistaxis even though he had hemoptysis this morning. Previous chest x-ray was normal.  - Refer to pulmonary specialist for evaluation of potential pulmonary bleeding sources. - if Pulmonary workup of hemoptysis is unrevealing, will need to see GI   Dysphagia coughing after meals  Last esophagram 2013 was with some aspiration. Flex scope today without pooling of secretions but he did have VF atrophy and glottic insufficiency. Need repeat swallow study to rule out aspiration and penetration as a source of his coughing.  - Order repeat swallow study to assess for aspiration and swallowing  difficulties (MBS/esophagram)  Chronic nasal congestion  Nasal endoscopy with narrowing of b/l nasal passages and S-shaped septum.  - Advised saline nasal spray to reduce nasal congestion.  Atrial fibrillation Chronic atrial fibrillation managed with Eliquis  for 10 years. Eliquis  may increase minor bleeding risk  SNHL hearing aid dependent  - annual hearing test - just had one recently   Update 04/26/2024 Assessment & Plan  Dysphagia pharyngoesophageal Esophageal dysmotility and osteophyte-induced esophageal compression on MBS/esophagram We discussed benefits of swallow therapy but he would like to hold off. - Encouraged focused eating and good oral hygiene.  Hemoptysis Reduced frequency, mild in severity - prior scope of upper airway without source of bleeding or epistaxis. Scheduled to see Pulm in Sept - see Pulm as scheduled   Atrial fibrillation Chronic atrial fibrillation managed with Eliquis  for 10 years. Eliquis  may increase minor bleeding risk     Elena Larry, MD [facility] Phone: [phone] Fax: [phone]    04/26/2024, 10:26 AM

## 2024-04-26 NOTE — Progress Notes (Signed)
 He presents today with chief complaint of painful calluses bilaterally painful elongated toenails.  Incurvated margins.  Objective: Vital signs are stable he is alert and oriented x 3.  Pulses are palpable no open lesions or wounds.  Benign hyperkeratotic lesion plantar aspect of the bilateral foot particularly beneath the fifth metatarsal heads bilaterally.  Toenails are thick and slightly elongated and tender on palpation extremities: Debridement.  Assessment: Pain in limb secondary to benign skin lesions and ingrown toenails.  Plan: Debridement of benign skin lesions debridement of toenails 1 through 5 bilateral, toes.

## 2024-05-02 NOTE — Progress Notes (Unsigned)
  Electrophysiology Office Note:   Date:  05/03/2024  ID:  Brett Franklin, DOB 04/28/1926, MRN [id number]  Primary Cardiologist: None Primary Heart Failure: None Electrophysiologist: Jhovany Weidinger Gladis Norton, MD      History of Present Illness:   Brett Franklin is a 89 y.o. male with h/o hypertension, CVA, diabetes, atrial fibrillation seen today for routine electrophysiology followup.   Since last being seen in our clinic the patient reports doing for the most part well.  He has having some fatigue, weakness.  This has been going on for the last few months.  He wore a cardiac monitor that showed a 63% atrial fibrillation/flutter burden with at times rapid rates.  he denies chest pain, palpitations, dyspnea, PND, orthopnea, nausea, vomiting, dizziness, syncope, edema, weight gain, or early satiety.   Review of systems complete and found to be negative unless listed in HPI.   EP Information / Studies Reviewed:    EKG is ordered today. Personal review as below.  EKG Interpretation Date/Time:  Tuesday May 03 2024 11:32:58 EDT Ventricular Rate:  55 PR Interval:  204 QRS Duration:  106 QT Interval:  460 QTC Calculation: 440 R Axis:   83  Text Interpretation: Sinus bradycardia with Premature ventricular complexes Nonspecific ST abnormality When compared with ECG of 09-Feb-2024 11:53, Premature ventricular complexes Confirmed by Shyler Holzman ([id number]) on 05/03/2024 12:09:39 PM     Risk Assessment/Calculations:    CHA2DS2-VASc Score = 6   This indicates a 9.7% annual risk of stroke. The patient's score is based upon: CHF History: 0 HTN History: 1 Diabetes History: 1 Stroke History: 2 Vascular Disease History: 0 Age Score: 2 Gender Score: 0            Physical Exam:   VS:  BP 114/62 (BP Location: Right Arm, Patient Position: Sitting, Cuff Size: Normal)   Pulse (!) 55   Ht 5' 9.5 (1.765 m)   Wt 148 lb 8 oz (67.4 kg)   SpO2 100%   BMI 21.62 kg/m    Wt Readings from Last 3  Encounters:  05/03/24 148 lb 8 oz (67.4 kg)  04/19/24 150 lb (68 kg)  02/09/24 148 lb (67.1 kg)     GEN: Well nourished, well developed in no acute distress NECK: No JVD; No carotid bruits CARDIAC: Regular rate and rhythm, no murmurs, rubs, gallops RESPIRATORY:  Clear to auscultation without rales, wheezing or rhonchi  ABDOMEN: Soft, non-tender, non-distended EXTREMITIES:  No edema; No deformity   ASSESSMENT AND PLAN:    1.  Paroxysmal atrial fibrillation: On Multaq .  He is continue to have episodes of atrial fibrillation and flutter.  He feels quite poorly when he has any his arrhythmias.  He would prefer further rhythm control.  Mandel Seiden stop Multaq  and start amiodarone .  2.  Secondary hypercoagulable state: On Eliquis   3.  Hypertension: Well-controlled  Follow up with [facility] Clinic in 3 months  Signed, Paytin Ramakrishnan Gladis Norton, MD

## 2024-05-03 ENCOUNTER — Ambulatory Visit: Attending: Cardiology | Admitting: Cardiology

## 2024-05-03 ENCOUNTER — Encounter: Payer: Self-pay | Admitting: Cardiology

## 2024-05-03 VITALS — BP 114/62 | HR 55 | Ht 69.5 in | Wt 148.5 lb

## 2024-05-03 DIAGNOSIS — I4892 Unspecified atrial flutter: Secondary | ICD-10-CM

## 2024-05-03 DIAGNOSIS — I4819 Other persistent atrial fibrillation: Secondary | ICD-10-CM | POA: Diagnosis not present

## 2024-05-03 DIAGNOSIS — D6869 Other thrombophilia: Secondary | ICD-10-CM | POA: Diagnosis not present

## 2024-05-03 MED ORDER — AMIODARONE HCL 200 MG PO TABS: ORAL_TABLET | ORAL | 3 refills | Status: DC

## 2024-05-03 NOTE — Patient Instructions (Addendum)
 Medication Instructions:  Your physician has recommended you make the following change in your medication:  START Amiodarone   - take 1 tablet (200 mg total) TWICE a day for 1 month, then  - take 1 tablet (200 mg total) ONCE a day   *If you need a refill on your cardiac medications before your next appointment, please call your pharmacy*  Lab Work: None ordered  Testing/Procedures: None ordered  Follow-Up: At [facility], you and your health needs are our priority.  As part of our continuing mission to provide you with exceptional heart care, our providers are all part of one team.  This team includes your primary Cardiologist (physician) and Advanced Practice Providers or APPs (Physician Assistants and Nurse Practitioners) who all work together to provide you with the care you need, when you need it.  Your next appointment:   3 month(s)  Provider:   You will follow up in the At[facility] Fibrillation Clinic located at [facility]. Your provider will be: Clint R. Fenton, PA-C or Fairy Heinrich, PA-C    Thank you for choosing [employer]!!   Maeola Domino, RN [phone]   Other Instructions  Amiodarone  Tablets What is this medication? AMIODARONE  (a MEE oh da rone) prevents and treats a fast or irregular heartbeat (arrhythmia). It works by slowing down overactive electric signals in the heart, which stabilizes your heart rhythm. It belongs to a group of medications called antiarrhythmics. This medicine may be used for other purposes; ask your health care provider or pharmacist if you have questions. COMMON BRAND NAME(S): Cordarone , Pacerone  What should I tell my care team before I take this medication? They need to know if you have any of these conditions: Liver disease Lung disease Other heart problems Thyroid  disease An unusual or allergic reaction to amiodarone , iodine, other medications, foods, dyes, or preservatives Pregnant or trying to get  pregnant Breast-feeding How should I use this medication? Take this medication by mouth with water. Take it as directed on the prescription label at the same time every day. You can take it with or without food. You should always take it the same way. Keep taking it unless your care team tells you to stop. A special MedGuide will be given to you by the pharmacist with each prescription and refill. Be sure to read this information carefully each time. Talk to your care team about the use of this medication in children. Special care may be needed. Overdosage: If you think you have taken too much of this medicine contact a poison control center or emergency room at once. NOTE: This medicine is only for you. Do not share this medicine with others. What if I miss a dose? If you miss a dose, take it as soon as you can. If it is almost time for your next dose, take only that dose. Do not take double or extra doses. What may interact with this medication? Do not take this medication with any of the following: Abarelix Apomorphine Arsenic trioxide Certain antibiotics, such as erythromycin, gemifloxacin, levofloxacin, or pentamidine Certain medications for depression, such as amoxapine or tricyclic antidepressants Certain medications for fungal infections, such as fluconazole, itraconazole, ketoconazole, posaconazole, or voriconazole Certain medications for irregular heartbeat, such as disopyramide, dronedarone , ibutilide, propafenone, or sotalol Certain medications for malaria, such as chloroquine or halofantrine Cisapride Droperidol Haloperidol [place] Maprotiline Methadone Phenothiazines, such as chlorpromazine, mesoridazine, or thioridazine Pimozide Ranolazine Red yeast rice Vardenafil This medication may also interact with the following: Antivirals for  HIV Certain medications for blood pressure, heart disease, irregular heartbeat Certain medications for cholesterol, such as  atorvastatin, cerivastatin, lovastatin, or simvastatin  Certain medications for hepatitis C, such as sofosbuvir and ledipasvir; sofosbuvir Certain medications for seizures, such as phenytoin Certain medications for thyroid  problems Certain medications that prevent or treat blood clots, such as warfarin Cholestyramine Cimetidine Clopidogrel Cyclosporine Dextromethorphan Diuretics Dofetilide Fentanyl General anesthetics Grapefruit juice Lidocaine  Loratadine Methotrexate Other medications that cause heart rhythm changes Procainamide Quinidine Rifabutin, rifampin, or rifapentine St. John's Wort Trazodone Ziprasidone This list may not describe all possible interactions. Give your health care provider a list of all the medicines, herbs, non-prescription drugs, or dietary supplements you use. Also tell them if you smoke, drink alcohol, or use illegal drugs. Some items may interact with your medicine. What should I watch for while using this medication? Your condition will be monitored closely when you first begin therapy. This medication is often started in a hospital or other monitored health care setting. Once you are on maintenance therapy, visit your care team for regular checks on your progress. Because your condition and use of this medication carry some risk, it is a good idea to carry an identification card, necklace, or bracelet with details of your condition, medications, and care team. This medication may affect your coordination, reaction time, or judgment. Do not drive or operate machinery until you know how this medication affects you. Sit up or stand slowly to reduce the risk of dizzy or fainting spells. Drinking alcohol with this medication can increase the risk of these side effects. This medication can make you more sensitive to the sun. Keep out of the sun. If you cannot avoid being in the sun, wear protective clothing and sunscreen. Do not use sun lamps, tanning beds, or  tanning booths. You should have regular eye exams before and during treatment. Call your care team if you have blurred vision, see halos, or your eyes become sensitive to light. Your eyes may get dry. It may be helpful to use a lubricating eye solution or artificial tears solution. If you are going to have surgery or a procedure that requires contrast dyes, tell your care team that you are taking this medication. What side effects may I notice from receiving this medication? Side effects that you should report to your care team as soon as possible: Allergic reactions--skin rash, itching, hives, swelling of the face, lips, tongue, or throat Bluish-gray skin Change in vision such as blurry vision, seeing halos around lights, vision loss Heart failure--shortness of breath, swelling of the ankles, feet, or hands, sudden weight gain, unusual weakness or fatigue Heart rhythm changes--fast or irregular heartbeat, dizziness, feeling faint or lightheaded, chest pain, trouble breathing High thyroid  levels (hyperthyroidism)--fast or irregular heartbeat, weight loss, excessive sweating or sensitivity to heat, tremors or shaking, anxiety, nervousness, irregular menstrual cycle or spotting Liver injury--right upper belly pain, loss of appetite, nausea, light-colored stool, dark yellow or brown urine, yellowing skin or eyes, unusual weakness or fatigue Low thyroid  levels (hypothyroidism)--unusual weakness or fatigue, sensitivity to cold, constipation, hair loss, dry skin, weight gain, feelings of depression Lung injury--shortness of breath or trouble breathing, cough, spitting up blood, chest pain, fever Pain, tingling, or numbness in the hands or feet, muscle weakness, trouble walking, loss of balance or coordination Side effects that usually do not require medical attention (report to your care team if they continue or are bothersome): Nausea Vomiting This list may not describe all possible side effects. Call  your doctor  for medical advice about side effects. You may report side effects to FDA at 1-800-FDA-1088. Where should I keep my medication? Keep out of the reach of children and pets. Store at room temperature between 20 and 25 degrees C (68 and 77 degrees F). Protect from light. Keep container tightly closed. Throw away any unused medication after the expiration date. NOTE: This sheet is a summary. It may not cover all possible information. If you have questions about this medicine, talk to your doctor, pharmacist, or health care provider.  2024 Elsevier/Gold Standard (2022-04-04 00:00:00)

## 2024-05-13 ENCOUNTER — Telehealth: Payer: Self-pay | Admitting: Cardiology

## 2024-05-13 NOTE — Telephone Encounter (Signed)
 Pt c/o medication issue:  1. Name of Medication: amiodarone  (PACERONE ) 200 MG tablet   2. How are you currently taking this medication (dosage and times per day)?    3. Are you having a reaction (difficulty breathing--STAT)? no  4. What is your medication issue? Wife is calling to speak to the nurse about medication and how it is affecting the patient. Please advise

## 2024-05-13 NOTE — Telephone Encounter (Signed)
 Spoke with Dr Nancey- DOD- said for patient to- do what was ordered/instructed at 05/03/24 appointment. The symptoms are coming from the fact that he is taking both medications- Multaq  and Amiodarone .  Called and spoke with Patsy (dpr) given the information above. She verbalized understanding, to stop the Multaq  and take Amiodarone ; pt still needs to be taking diltiazem  as well.   Given ER precautions.

## 2024-05-13 NOTE — Telephone Encounter (Signed)
 Patsy- reports that the patient has not slept in about 3-4 days- started Amiodarone - 05/07/24- since starting this medication- blurred vision, feels nervous, weak, hot flashes, feels like his lips are swollen.  No trouble breathing, some shortness of breath with activity-  From office visit 8/5- Multaq  was d/c'd  and started on Amiodarone   Pt has been taking Multaq  and Amiodarone - did not stop the Multaq - but stopped the diltiazem ....   Pt has not taken anything this morning due to these side effects. Informed them that I would speak with the provider and call them back.

## 2024-05-16 NOTE — Telephone Encounter (Signed)
 Pt wife calling to report pt still having the same symptoms from previous phone note and would like a c/b.

## 2024-05-16 NOTE — Telephone Encounter (Signed)
 Called patient wife and patient back about message. Patient complaining of feeling weak, blurred vision, SOB with activity, and just not feeling himself since starting Amiodarone  05/04/24. Patient is taking just Amiodarone  and not Multaq . He is also taking the diltiazem  as directed. Patient's wife stated patient was doing a lot better on Multaq , and he has just declined in his ADLs since starting Amiodarone . Will send message to Dr. Inocencio for advisement. Encouraged patient's wife to take patient to ED if his symptoms get worse.

## 2024-05-17 NOTE — Telephone Encounter (Signed)
 Pt spouse calling in for an update. Informed her we are still waiting on response from Dr. Inocencio.

## 2024-05-18 ENCOUNTER — Ambulatory Visit (INDEPENDENT_AMBULATORY_CARE_PROVIDER_SITE_OTHER): Admitting: Internal Medicine

## 2024-05-18 ENCOUNTER — Encounter: Payer: Self-pay | Admitting: Internal Medicine

## 2024-05-18 VITALS — BP 130/60 | HR 76 | Temp 98.1°F | Ht 69.5 in | Wt 146.8 lb

## 2024-05-18 DIAGNOSIS — F5104 Psychophysiologic insomnia: Secondary | ICD-10-CM

## 2024-05-18 DIAGNOSIS — G4700 Insomnia, unspecified: Secondary | ICD-10-CM | POA: Insufficient documentation

## 2024-05-18 MED ORDER — DRONEDARONE HCL 400 MG PO TABS: 400.0000 mg | ORAL_TABLET | Freq: Two times a day (BID) | ORAL | Status: DC

## 2024-05-18 MED ORDER — TRAZODONE HCL 50 MG PO TABS: 25.0000 mg | ORAL_TABLET | Freq: Every evening | ORAL | 3 refills | Status: DC | PRN

## 2024-05-18 NOTE — Patient Instructions (Addendum)
     Medications changes include :   trazodone  50 mg at bedtime      Return if symptoms worsen or fail to improve.

## 2024-05-18 NOTE — Telephone Encounter (Signed)
 Returned call to Pt's spouse, Patsy and shared Dr. Carolyn response: Reduce amiodarone  to daily dosing. If he does not feel better with this dosing, would switch back to Multaq .   Patsy states patient had already reduced to once daily dosing of Amiodarone  and did not notice any improvement. Today they made the decision themselves to switch back to Multaq  400 mg BID since Pt tolerated this better in the past.  Amiodarone  discontinued from medication list and Multaq  400 mg BID added. Patsy states they had an Rx filled on 8/3 and do not need any sent in at this time.   Patsy verbalized understanding of the above and expressed appreciation for call.

## 2024-05-18 NOTE — Assessment & Plan Note (Signed)
 Acute Related to recent change in medication, symptoms he had from this and anxiety Would like to avoid ativan  on a regular basis Start trazodone  25-50 mg HS prn -- hopefully this will help his sleep and would anticipate he will not need this long term

## 2024-05-18 NOTE — Progress Notes (Signed)
 Subjective:    Patient ID: Brett Franklin, male    DOB: 09/27/1926, 89 y.o.   MRN: [id number]      HPI Brett Franklin is here for  Chief Complaint  Patient presents with   Insomnia   Medication Problem    Discuss ativan  for sleep, and discuss Multaq  and Amiodarone    He is here with his wife.     Two weeks ago - went to heart doctor - started amiodarone  - took it for 4 days and was also taking his  multaq   - he started to have SOB, palps,weakness and felt terrible.    He has not been able to sleep since this started.  At some points he was not able to sleep for a day and a half.  Since 7/22 he has taken ativan  6 times.  It has helped.  He knows this should not be taken regularly.     He still feels a little weak.  He is a little anxious.      Medications and allergies reviewed with patient and updated if appropriate.  Current Outpatient Medications on File Prior to Visit  Medication Sig Dispense Refill   Accu-Chek Softclix Lancets lancets FOLLOW PACKAGE DIRECTIONS TO CHECK BLOOD SUGARS DAILY 100 each 1   acetaminophen -codeine  (TYLENOL  #3) 300-30 MG tablet Take 1 tablet by mouth every 8 (eight) hours as needed for moderate pain (pain score 4-6). 15 tablet 0   blood glucose meter kit and supplies KIT Dispense based on patient and insurance preference. Use to text blood sugars daily. 1 each 0   diltiazem  (CARDIZEM  CD) 240 MG 24 hr capsule Take 1 capsule (240 mg total) by mouth daily. 90 capsule 1   diltiazem  (CARDIZEM ) 30 MG tablet TAKE 1 TABLET BY MOUTH DAILY AS NEEDED 90 tablet 2   dronedarone  (MULTAQ ) 400 MG tablet Take 1 tablet (400 mg total) by mouth 2 (two) times daily with a meal.     ELIQUIS  5 MG TABS tablet TAKE 1 TABLET(5 MG) BY MOUTH TWICE DAILY 180 tablet 3   glucose blood test strip Use to test blood sugars once daily. Dx code-E11.49 100 each 3   JANUMET  50-500 MG tablet TAKE 1 TABLET BY MOUTH DAILY. 90 tablet 1   levothyroxine  (SYNTHROID ) 50 MCG tablet TAKE 1 TABLET(50  MCG) BY MOUTH DAILY 90 tablet 3   LORazepam  (ATIVAN ) 0.5 MG tablet TAKE 1 TABLET(0.5 MG) BY MOUTH EVERY 12 HOURS AS NEEDED FOR ANXIETY 20 tablet 5   metoprolol  tartrate (LOPRESSOR ) 25 MG tablet Take 25 mg by mouth 2 (two) times daily.     mometasone (NASONEX) 50 MCG/ACT nasal spray SMARTSIG:2 Spray(s) Both Nares Every Night     mupirocin  ointment (BACTROBAN ) 2 % Apply to wound with dressing changes 30 g 1   polyethylene glycol (MIRALAX / GLYCOLAX) 17 g packet Take 17 g by mouth daily.     pravastatin  (PRAVACHOL ) 20 MG tablet TAKE 1 TABLET(20 MG) BY MOUTH DAILY 90 tablet 1   No current facility-administered medications on file prior to visit.    Review of Systems     Objective:   Vitals:   05/18/24 1545  BP: 130/60  Pulse: 76  Temp: 98.1 F (36.7 C)  SpO2: 99%   BP Readings from Last 3 Encounters:  05/18/24 130/60  05/03/24 114/62  04/26/24 (!) 148/68   Wt Readings from Last 3 Encounters:  05/18/24 146 lb 12.8 oz (66.6 kg)  05/03/24 148 lb 8 oz (67.4 kg)  04/19/24 150 lb (68 kg)   Body mass index is 21.37 kg/m.    Physical Exam Constitutional:      General: He is not in acute distress.    Appearance: Normal appearance. He is not ill-appearing.  HENT:     Head: Normocephalic and atraumatic.  Skin:    General: Skin is warm and dry.  Neurological:     Mental Status: He is alert. Mental status is at baseline.  Psychiatric:        Behavior: Behavior normal.        Thought Content: Thought content normal.        Judgment: Judgment normal.     Comments: Slightly anxious            Assessment & Plan:    See Problem List for Assessment and Plan of chronic medical problems.

## 2024-05-26 ENCOUNTER — Encounter: Payer: Self-pay | Admitting: Cardiology

## 2024-05-26 ENCOUNTER — Telehealth: Payer: Self-pay

## 2024-05-26 NOTE — Telephone Encounter (Signed)
 error

## 2024-05-26 NOTE — Telephone Encounter (Signed)
 Spoke with spouse today.  She states he only takes the trazadone at night for sleep and was using the lorazapam for his panic attacks.  All questions answered.

## 2024-05-26 NOTE — Telephone Encounter (Signed)
 Copied from CRM [id number]. Topic: Clinical - Medication Question >> May 26, 2024 10:13 AM Harlene ORN wrote: Reason for CRM: Patient was given tramadol  for sleeping. Also has lorazapam. Is worried about a negative drug interaction. Please call back the spouse to discuss with the patient on home phone number.

## 2024-05-26 NOTE — Telephone Encounter (Signed)
 He was given trazodone  for his sleep not tramadol .  He should not be taking the lorazepam  with the trazodone -only taking trazodone  at night.  He should only take the lorazepam  when he absolutely needs it for anxiety.

## 2024-05-30 ENCOUNTER — Other Ambulatory Visit: Payer: Self-pay | Admitting: Internal Medicine

## 2024-05-30 NOTE — Progress Notes (Unsigned)
 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    History of Present Illness  05/31/2024  Pulmonary/ 1st office eval/Brett Franklin  Chief Complaint  Patient presents with   Cough    Cough with very small spot of blood.  Occurs after meals.  Sx x 6 months.  Dyspnea:  can do steps, go out on Friday nights/ walk thru [employer]  Cough: daily p eating otherwise no cough at all   Sleep: bed is flat / one pillow s noct cough  SABA use: none  02 ldz:wnwz      No obvious day to day or daytime pattern/variability or assoc excess/ purulent sputum or mucus plugs or hemoptysis or cp or chest tightness, subjective wheeze or overt sinus or hb symptoms.    Also denies any obvious fluctuation of symptoms with weather or environmental changes or other aggravating or alleviating factors except as outlined above   No unusual exposure hx or h/o childhood pna/ asthma or knowledge of premature birth.  Current Allergies, Complete Past Medical History, Past Surgical History, Family History, and Social History were reviewed in [employer] record.  ROS  The following are not active complaints unless bolded Hoarseness, sore throat, dysphagia, dental problems, itching, sneezing,  nasal congestion or discharge of excess mucus or purulent secretions, ear ache,   fever, chills, sweats, unintended wt loss or wt gain, classically pleuritic or exertional cp,  orthopnea pnd or arm/hand swelling  or leg swelling, presyncope, palpitations, abdominal pain, anorexia, nausea, vomiting, diarrhea  or change in bowel habits or change in bladder habits, change in stools  or change in urine, dysuria, hematuria,  rash, arthralgias, visual complaints, headache, numbness, weakness or ataxia or problems with walking or coordination,  change in mood or  memory.             Outpatient Medications Prior to Visit  Medication Sig Dispense Refill   Accu-Chek Softclix Lancets lancets FOLLOW PACKAGE DIRECTIONS TO CHECK BLOOD SUGARS DAILY 100 each 1   acetaminophen -codeine  (TYLENOL  #3) 300-30 MG tablet Take 1 tablet by mouth every 8 (eight) hours as needed for moderate pain (pain score 4-6). 15 tablet 0   blood glucose meter kit and supplies KIT Dispense based on patient and insurance preference. Use to text blood sugars daily. 1 each 0   diltiazem  (CARDIZEM  CD) 240 MG 24 hr capsule Take 1 capsule (240 mg total) by mouth daily. 90 capsule 1   diltiazem  (CARDIZEM ) 30 MG tablet TAKE 1 TABLET BY MOUTH DAILY AS NEEDED 90 tablet 2   dronedarone  (MULTAQ ) 400 MG tablet Take 1 tablet (400 mg total) by mouth 2 (two) times daily with a meal.     ELIQUIS  5 MG TABS tablet TAKE 1 TABLET(5 MG) BY MOUTH TWICE DAILY 180 tablet 3   glucose blood test strip Use to test blood sugars once daily. Dx code-E11.49 100 each 3   JANUMET  50-500 MG tablet TAKE 1 TABLET BY MOUTH DAILY. 90 tablet 1   levothyroxine  (SYNTHROID ) 50 MCG tablet TAKE 1 TABLET(50 MCG) BY MOUTH DAILY 90 tablet 3  LORazepam  (ATIVAN ) 0.5 MG tablet TAKE 1 TABLET(0.5 MG) BY MOUTH EVERY 12 HOURS AS NEEDED FOR ANXIETY 20 tablet 5   metoprolol  tartrate (LOPRESSOR ) 25 MG tablet Take 25 mg by mouth 2 (two) times daily.     mometasone (NASONEX) 50 MCG/ACT nasal spray SMARTSIG:2 Spray(s) Both Nares Every Night     mupirocin  ointment (BACTROBAN ) 2 % Apply to wound with dressing changes 30 g 1   polyethylene glycol (MIRALAX / GLYCOLAX) 17 g packet Take 17 g by mouth daily.     pravastatin  (PRAVACHOL ) 20 MG tablet TAKE 1 TABLET(20 MG) BY MOUTH DAILY 90 tablet 1   traZODone  (DESYREL ) 50 MG tablet Take 0.5-1 tablets (25-50 mg total) by mouth  at bedtime as needed for sleep. 30 tablet 3   No facility-administered medications prior to visit.    Past Medical History:  Diagnosis Date   Adenomatous polyp of colon 11/1996   Dr Brett Franklin   Anxiety    BPH (benign prostatic hyperplasia)    Cervical spinal stenosis    CVA (cerebral vascular accident) (HCC)    Diabetes mellitus, type 2 (HCC)    GERD (gastroesophageal reflux disease)    Hearing loss    Hemorrhoids    Hypertension    IBS (irritable bowel syndrome)    Melanoma (HCC)    Nephrolithiasis     X 4      Objective:     BP 130/64   Pulse 68   Temp (!) 97.5 F (36.4 C) (Oral)   Ht 5' 10 (1.778 m)   Wt 152 lb 3.2 oz (69 kg)   SpO2 98% Comment: room air  BMI 21.84 kg/m   SpO2: 98 % (room air)= somewhat frail elderly wm with poor hearing / no spont cough or throat clearing   HEENT : Oropharynx  cledar      Nasal turbinates nl    NECK :  without  apparent JVD/ palpable Nodes/TM    LUNGS: no acc muscle use,  Nl contour chest which is clear to A and P bilaterally without cough on insp or exp maneuvers   CV:  RRR  no s3 or murmur or increase in P2, and no edema   ABD:  soft and nontender   MS:  Gait nl   ext warm without deformities Or obvious joint restrictions  calf tenderness, cyanosis or clubbing    SKIN: warm and dry without lesions    NEURO:  alert, approp, nl sensorium with  no motor or cerebellar deficits apparent.    CXR PA and Lateral:   05/31/2024 :    I personally reviewed images and impression is as follows:    No acute findings to suggest aspiration or  source for hemoptysis        Assessment   Assessment & Plan Chronic cough 03/01/24  ST eval pos for penetration and encouraged to cough p eating  - referred for outpt speech therapy 05/31/2024   I'm surprised he has not had more or ongoing low grade hemoptyis considering he's on elquis (making it unlikely it's a pulmonary source)  but cxr x 2 is normal and he's never smoked and at time of  pulmonary eval says no heme for over a month so I don't think there will be any pulmonary source identified with addtional  studies at this point.   Discussed at length with his speech therapist Brett Franklin who doesn't thing pt and wife got the message he intended re dietary restrictions and  risk of future aspiration and rec outpt speech therapy to improve dysphagia if possible and go over his instructions in more detail.   Discussed in detail all the  indications, usual  risks and alternatives  relative to the benefits with patient who agrees to proceed with Rx as outlined.        Each maintenance medication was reviewed in detail including emphasizing most importantly the difference between maintenance and prns and under what circumstances the prns are to be triggered using an action plan format where appropriate.  Total time for H and P, chart review, counseling,   and generating customized AVS unique to this office visit / same day charting = 45 ,min new pt eval        AVS  Patient Instructions  Please remember to go to the  x-ray department  for your tests - we will call you with the results when they are available      I will be referring you back to Speech therapy for re-evaluation of your swallowing    Pulmonary follow up is as needed    Ozell America, MD 06/01/2024

## 2024-05-31 ENCOUNTER — Encounter: Payer: Self-pay | Admitting: Internal Medicine

## 2024-05-31 ENCOUNTER — Ambulatory Visit

## 2024-05-31 ENCOUNTER — Ambulatory Visit (INDEPENDENT_AMBULATORY_CARE_PROVIDER_SITE_OTHER): Admitting: Internal Medicine

## 2024-05-31 ENCOUNTER — Telehealth: Payer: Self-pay | Admitting: Cardiology

## 2024-05-31 VITALS — BP 130/64 | HR 68 | Temp 97.5°F | Ht 70.0 in | Wt 152.2 lb

## 2024-05-31 DIAGNOSIS — R053 Chronic cough: Secondary | ICD-10-CM

## 2024-05-31 MED ORDER — DRONEDARONE HCL 400 MG PO TABS: 400.0000 mg | ORAL_TABLET | Freq: Two times a day (BID) | ORAL | 3 refills | Status: AC

## 2024-05-31 NOTE — Assessment & Plan Note (Signed)
 03/01/24  ST eval pos for penetration and encouraged to cough p eating  I'm surprised he has not had more or ongoing low grade hemoptyis considering he's on elquis (making it unlikely it's a pulmonary source)  but cxr x 2 is normal and he's never smoked and at time of pulmonary eval says no heme in over a month so I don't think there will be any pulmonary source identified with addtional  studies at this point.

## 2024-05-31 NOTE — Patient Instructions (Addendum)
 Please remember to go to the  x-ray department  for your tests - we will call you with the results when they are available      I will be referring you back to Speech therapy for re-evaluation of your swallowing    Pulmonary follow up is as needed

## 2024-05-31 NOTE — Telephone Encounter (Signed)
*  STAT* If patient is at the pharmacy, call can be transferred to refill team.   1. Which medications need to be refilled? (please list name of each medication and dose if known)   dronedarone  (MULTAQ ) 400 MG tablet   2. Would you like to learn more about the convenience, safety, & potential cost savings by using the [facility]?   3. Are you open to using the [facility] (Type [facility]. ).  4. Which pharmacy/location (including street and city if local pharmacy) is medication to be sent to?  [id number] - [place], [place] - [address] AT [facility] [address]   5. Do they need a 30 day or 90 day supply?   90 day  Wife (Patsy) stated patient still has some medication left.

## 2024-05-31 NOTE — Telephone Encounter (Signed)
 RX sent in

## 2024-06-01 ENCOUNTER — Telehealth: Payer: Self-pay | Admitting: Internal Medicine

## 2024-06-01 ENCOUNTER — Encounter: Payer: Self-pay | Admitting: Internal Medicine

## 2024-06-01 DIAGNOSIS — R053 Chronic cough: Secondary | ICD-10-CM

## 2024-06-01 NOTE — Telephone Encounter (Signed)
 I called and spoke with the pt's spouse ok per DPR I notified her of the recommendations from Dr Darlean and she verbalized understanding   PCC's- I see that the referral for outpt speech was closed. Is there another practice you can try? Thanks!

## 2024-06-01 NOTE — Telephone Encounter (Signed)
 Patient order was sent

## 2024-06-01 NOTE — Progress Notes (Unsigned)
    Subjective:    Patient ID: Brett Franklin, male    DOB: [birth year], 89 y.o.   MRN: [id number]      HPI Shad is here for No chief complaint on file.   Swelling in feet -    Foggy, dark urine -      Medications and allergies reviewed with patient and updated if appropriate.  Current Outpatient Medications on File Prior to Visit  Medication Sig Dispense Refill   Accu-Chek Softclix Lancets lancets FOLLOW PACKAGE DIRECTIONS TO CHECK BLOOD SUGARS DAILY 100 each 1   acetaminophen -codeine  (TYLENOL  #3) 300-30 MG tablet Take 1 tablet by mouth every 8 (eight) hours as needed for moderate pain (pain score 4-6). 15 tablet 0   blood glucose meter kit and supplies KIT Dispense based on patient and insurance preference. Use to text blood sugars daily. 1 each 0   diltiazem  (CARDIZEM  CD) 240 MG 24 hr capsule Take 1 capsule (240 mg total) by mouth daily. 90 capsule 1   diltiazem  (CARDIZEM ) 30 MG tablet TAKE 1 TABLET BY MOUTH DAILY AS NEEDED 90 tablet 2   dronedarone  (MULTAQ ) 400 MG tablet Take 1 tablet (400 mg total) by mouth 2 (two) times daily with a meal. 180 tablet 3   ELIQUIS  5 MG TABS tablet TAKE 1 TABLET(5 MG) BY MOUTH TWICE DAILY 180 tablet 3   glucose blood test strip Use to test blood sugars once daily. Dx code-E11.49 100 each 3   JANUMET  50-500 MG tablet TAKE 1 TABLET BY MOUTH DAILY. 90 tablet 1   levothyroxine  (SYNTHROID ) 50 MCG tablet TAKE 1 TABLET(50 MCG) BY MOUTH DAILY 90 tablet 3   LORazepam  (ATIVAN ) 0.5 MG tablet TAKE 1 TABLET(0.5 MG) BY MOUTH EVERY 12 HOURS AS NEEDED FOR ANXIETY 20 tablet 5   metoprolol  tartrate (LOPRESSOR ) 25 MG tablet Take 25 mg by mouth 2 (two) times daily.     mometasone (NASONEX) 50 MCG/ACT nasal spray SMARTSIG:2 Spray(s) Both Nares Every Night     mupirocin  ointment (BACTROBAN ) 2 % Apply to wound with dressing changes 30 g 1   polyethylene glycol (MIRALAX / GLYCOLAX) 17 g packet Take 17 g by mouth daily.     pravastatin  (PRAVACHOL ) 20 MG tablet TAKE  1 TABLET(20 MG) BY MOUTH DAILY 90 tablet 1   traZODone  (DESYREL ) 50 MG tablet Take 0.5-1 tablets (25-50 mg total) by mouth at bedtime as needed for sleep. 30 tablet 3   No current facility-administered medications on file prior to visit.    Review of Systems     Objective:  There were no vitals filed for this visit. BP Readings from Last 3 Encounters:  05/31/24 130/64  05/18/24 130/60  05/03/24 114/62   Wt Readings from Last 3 Encounters:  05/31/24 152 lb 3.2 oz (69 kg)  05/18/24 146 lb 12.8 oz (66.6 kg)  05/03/24 148 lb 8 oz (67.4 kg)   There is no height or weight on file to calculate BMI.    Physical Exam         Assessment & Plan:    See Problem List for Assessment and Plan of chronic medical problems.

## 2024-06-01 NOTE — Telephone Encounter (Signed)
 Let him know I talked to inpt Speech and they rec outpt f/u

## 2024-06-01 NOTE — Telephone Encounter (Signed)
I have placed new referral

## 2024-06-01 NOTE — Telephone Encounter (Signed)
 Sonny can you put in a new order due to that office closing the order it wont let me send it to a new location.

## 2024-06-02 ENCOUNTER — Ambulatory Visit: Payer: Self-pay | Admitting: Internal Medicine

## 2024-06-02 ENCOUNTER — Ambulatory Visit (INDEPENDENT_AMBULATORY_CARE_PROVIDER_SITE_OTHER): Admitting: Internal Medicine

## 2024-06-02 VITALS — BP 128/70 | HR 64 | Temp 98.2°F | Ht 70.0 in | Wt 152.0 lb

## 2024-06-02 DIAGNOSIS — I10 Essential (primary) hypertension: Secondary | ICD-10-CM | POA: Diagnosis not present

## 2024-06-02 DIAGNOSIS — E039 Hypothyroidism, unspecified: Secondary | ICD-10-CM

## 2024-06-02 DIAGNOSIS — R600 Localized edema: Secondary | ICD-10-CM | POA: Diagnosis not present

## 2024-06-02 DIAGNOSIS — F419 Anxiety disorder, unspecified: Secondary | ICD-10-CM

## 2024-06-02 DIAGNOSIS — R82998 Other abnormal findings in urine: Secondary | ICD-10-CM | POA: Diagnosis not present

## 2024-06-02 LAB — COMPREHENSIVE METABOLIC PANEL WITH GFR
ALT: 15 U/L (ref 0–53)
AST: 17 U/L (ref 0–37)
Albumin: 3.9 g/dL (ref 3.5–5.2)
Alkaline Phosphatase: 58 U/L (ref 39–117)
BUN: 25 mg/dL — ABNORMAL HIGH (ref 6–23)
CO2: 26 meq/L (ref 19–32)
Calcium: 8.8 mg/dL (ref 8.4–10.5)
Chloride: 103 meq/L (ref 96–112)
Creatinine, Ser: 1.34 mg/dL (ref 0.40–1.50)
GFR: 44.71 mL/min — ABNORMAL LOW (ref >60.00)
Glucose, Bld: 172 mg/dL — ABNORMAL HIGH (ref 70–99)
Potassium: 4.2 meq/L (ref 3.5–5.1)
Sodium: 138 meq/L (ref 135–145)
Total Bilirubin: 0.6 mg/dL (ref 0.2–1.2)
Total Protein: 6.7 g/dL (ref 6.0–8.3)

## 2024-06-02 LAB — CBC WITH DIFFERENTIAL/PLATELET
Basophils Absolute: 0 K/uL (ref 0.0–0.1)
Basophils Relative: 0.3 % (ref 0.0–3.0)
Eosinophils Absolute: 0 K/uL (ref 0.0–0.7)
Eosinophils Relative: 0.3 % (ref 0.0–5.0)
HCT: 38.4 % — ABNORMAL LOW (ref 39.0–52.0)
Hemoglobin: 12.9 g/dL — ABNORMAL LOW (ref 13.0–17.0)
Lymphocytes Relative: 12.7 % (ref 12.0–46.0)
Lymphs Abs: 1.3 K/uL (ref 0.7–4.0)
MCHC: 33.7 g/dL (ref 30.0–36.0)
MCV: 97 fl (ref 78.0–100.0)
Monocytes Absolute: 1 K/uL (ref 0.1–1.0)
Monocytes Relative: 9.9 % (ref 3.0–12.0)
Neutro Abs: 7.9 K/uL — ABNORMAL HIGH (ref 1.4–7.7)
Neutrophils Relative %: 76.8 % (ref 43.0–77.0)
Platelets: 195 K/uL (ref 150.0–400.0)
RBC: 3.95 Mil/uL — ABNORMAL LOW (ref 4.22–5.81)
RDW: 15.1 % (ref 11.5–15.5)
WBC: 10.3 K/uL (ref 4.0–10.5)

## 2024-06-02 LAB — URINALYSIS, ROUTINE W REFLEX MICROSCOPIC
Bilirubin Urine: NEGATIVE
Hgb urine dipstick: NEGATIVE
Leukocytes,Ua: NEGATIVE
Nitrite: NEGATIVE
Specific Gravity, Urine: >=1.03 — AB (ref 1.000–1.030)
Urine Glucose: NEGATIVE
Urobilinogen, UA: 1 (ref 0.0–1.0)
pH: 5.5 (ref 5.0–8.0)

## 2024-06-02 LAB — TSH: TSH: 6.54 u[IU]/mL — ABNORMAL HIGH (ref 0.35–5.50)

## 2024-06-02 LAB — BRAIN NATRIURETIC PEPTIDE: Pro B Natriuretic peptide (BNP): 635 pg/mL — ABNORMAL HIGH (ref 0.0–100.0)

## 2024-06-02 LAB — HEMOGLOBIN A1C: Hgb A1c MFr Bld: 7.1 % — ABNORMAL HIGH (ref 4.6–6.5)

## 2024-06-02 MED ORDER — DILTIAZEM HCL ER COATED BEADS 180 MG PO CP24: 180.0000 mg | ORAL_CAPSULE | Freq: Every day | ORAL | 5 refills | Status: DC

## 2024-06-02 NOTE — Assessment & Plan Note (Signed)
 Chronic  Clinically euthyroid TSH today Continue levothyroxine  50 mcg daily

## 2024-06-02 NOTE — Assessment & Plan Note (Signed)
 Acute Started one week ago - not sure of cause No SOB, palps, cp No change in meds Continue elevating legs, walking intermittently during day Cmp, bnp, tsh, cbc Decrease diltiazem  due to orthostatic hypotension F/u in 2 weeks

## 2024-06-02 NOTE — Assessment & Plan Note (Signed)
 Chronic Blood pressure well controlled but has symptoms suggestive of orthostasis Decrease diltiazem  to 180 mg daily Continue metoprolol  25 mg twice daily Monitor BP at home

## 2024-06-02 NOTE — Assessment & Plan Note (Signed)
 Acute ? Uti Ua, ucx

## 2024-06-02 NOTE — Assessment & Plan Note (Signed)
 Chronic situation Continue lorazepam  0.5 mg twice daily as needed-takes infrequently - advised concerns with medication and to ideally avoid

## 2024-06-02 NOTE — Patient Instructions (Addendum)
      Blood work was ordered.       Medications changes include :   decrease diltiazem  to 180 mg daily     Return in about 2 weeks (around 06/16/2024) for follow up.

## 2024-06-03 LAB — URINE CULTURE

## 2024-06-03 MED ORDER — LEVOTHYROXINE SODIUM 50 MCG PO TABS: ORAL_TABLET | ORAL | Status: DC

## 2024-06-03 MED ORDER — FUROSEMIDE 20 MG PO TABS: 20.0000 mg | ORAL_TABLET | Freq: Every day | ORAL | 0 refills | Status: DC

## 2024-06-04 ENCOUNTER — Other Ambulatory Visit: Payer: Self-pay | Admitting: Internal Medicine

## 2024-06-05 ENCOUNTER — Other Ambulatory Visit: Payer: Self-pay | Admitting: Internal Medicine

## 2024-06-06 ENCOUNTER — Other Ambulatory Visit: Payer: Self-pay | Admitting: Internal Medicine

## 2024-06-08 ENCOUNTER — Other Ambulatory Visit: Payer: Self-pay | Admitting: Internal Medicine

## 2024-06-09 ENCOUNTER — Ambulatory Visit: Payer: Self-pay | Admitting: Internal Medicine

## 2024-06-09 NOTE — Progress Notes (Signed)
 Atc x1 lmtcb

## 2024-06-16 ENCOUNTER — Encounter: Payer: Self-pay | Admitting: Internal Medicine

## 2024-06-16 NOTE — Progress Notes (Unsigned)
 Subjective:    Patient ID: Brett Franklin, male    DOB: 16-Apr-1926, 89 y.o.   MRN: [id number]     HPI Brett Franklin is here for follow up of his chronic medical problems.  He was here 2 weeks ago - b/l feet swelling x 1 week. No SOB, chest pain or palps.  Also with weak spells.  Diltiazem  decreased due to orthostatic hypotension.  BNP was 635,  GFR 44.71, hgb 12.9, tsh 6.54,  a1  7.1.  urine w/o infection.   Increased thyroid  dose - 2 pills one day a week.  Given lasix  20 mg daily x 2 days only.    Acute heart failure likely related to afib  Medications and allergies reviewed with patient and updated if appropriate.  Current Outpatient Medications on File Prior to Visit  Medication Sig Dispense Refill   Accu-Chek Softclix Lancets lancets FOLLOW PACKAGE DIRECTIONS TO CHECK BLOOD SUGARS DAILY 100 each 1   acetaminophen -codeine  (TYLENOL  #3) 300-30 MG tablet Take 1 tablet by mouth every 8 (eight) hours as needed for moderate pain (pain score 4-6). 15 tablet 0   blood glucose meter kit and supplies KIT Dispense based on patient and insurance preference. Use to text blood sugars daily. 1 each 0   diltiazem  (CARDIZEM  CD) 180 MG 24 hr capsule Take 1 capsule (180 mg total) by mouth daily. 30 capsule 5   diltiazem  (CARDIZEM  CD) 240 MG 24 hr capsule Take 1 capsule (240 mg total) by mouth daily. (Patient not taking: Reported on 06/02/2024) 90 capsule 1   diltiazem  (CARDIZEM ) 30 MG tablet TAKE 1 TABLET BY MOUTH DAILY AS NEEDED 90 tablet 2   dronedarone  (MULTAQ ) 400 MG tablet Take 1 tablet (400 mg total) by mouth 2 (two) times daily with a meal. 180 tablet 3   ELIQUIS  5 MG TABS tablet TAKE 1 TABLET(5 MG) BY MOUTH TWICE DAILY 180 tablet 3   furosemide  (LASIX ) 20 MG tablet TAKE 1 TABLET(20 MG) BY MOUTH DAILY IN THE MORNING 2 tablet 0   glucose blood test strip Use to test blood sugars once daily. Dx code-E11.49 100 each 3   JANUMET  50-500 MG tablet TAKE 1 TABLET BY MOUTH DAILY. 90 tablet 1    levothyroxine  (SYNTHROID ) 50 MCG tablet TAKE 1 TABLET(50 MCG) BY MOUTH DAILY 6  DAYS A WEEK.  TAKE 2 TABS PO 1 DAY A WEEK     LORazepam  (ATIVAN ) 0.5 MG tablet TAKE 1 TABLET(0.5 MG) BY MOUTH EVERY 12 HOURS AS NEEDED FOR ANXIETY 20 tablet 5   metoprolol  tartrate (LOPRESSOR ) 25 MG tablet Take 25 mg by mouth 2 (two) times daily.     mometasone (NASONEX) 50 MCG/ACT nasal spray SMARTSIG:2 Spray(s) Both Nares Every Night     mupirocin  ointment (BACTROBAN ) 2 % Apply to wound with dressing changes 30 g 1   polyethylene glycol (MIRALAX / GLYCOLAX) 17 g packet Take 17 g by mouth daily.     pravastatin  (PRAVACHOL ) 20 MG tablet TAKE 1 TABLET(20 MG) BY MOUTH DAILY 90 tablet 1   traZODone  (DESYREL ) 50 MG tablet Take 0.5-1 tablets (25-50 mg total) by mouth at bedtime as needed for sleep. 30 tablet 3   No current facility-administered medications on file prior to visit.     Review of Systems     Objective:  There were no vitals filed for this visit. BP Readings from Last 3 Encounters:  06/02/24 128/70  05/31/24 130/64  05/18/24 130/60   Wt Readings from Last  3 Encounters:  06/02/24 152 lb (68.9 kg)  05/31/24 152 lb 3.2 oz (69 kg)  05/18/24 146 lb 12.8 oz (66.6 kg)   There is no height or weight on file to calculate BMI.    Physical Exam     Lab Results  Component Value Date   WBC 10.3 06/02/2024   HGB 12.9 (L) 06/02/2024   HCT 38.4 (L) 06/02/2024   PLT 195.0 06/02/2024   GLUCOSE 172 (H) 06/02/2024   CHOL 93 01/04/2024   TRIG 55.0 01/04/2024   HDL 55.60 01/04/2024   LDLCALC 26 01/04/2024   ALT 15 06/02/2024   AST 17 06/02/2024   NA 138 06/02/2024   K 4.2 06/02/2024   CL 103 06/02/2024   CREATININE 1.34 06/02/2024   BUN 25 (H) 06/02/2024   CO2 26 06/02/2024   TSH 6.54 (H) 06/02/2024   INR 0.99 12/21/2009   HGBA1C 7.1 (H) 06/02/2024   MICROALBUR 1.0 04/11/2010     Assessment & Plan:    See Problem List for Assessment and Plan of chronic medical problems.

## 2024-06-17 ENCOUNTER — Ambulatory Visit (INDEPENDENT_AMBULATORY_CARE_PROVIDER_SITE_OTHER): Admitting: Internal Medicine

## 2024-06-17 VITALS — BP 132/64 | HR 50 | Temp 98.0°F | Ht 70.0 in | Wt 146.0 lb

## 2024-06-17 DIAGNOSIS — R600 Localized edema: Secondary | ICD-10-CM

## 2024-06-17 DIAGNOSIS — E039 Hypothyroidism, unspecified: Secondary | ICD-10-CM | POA: Diagnosis not present

## 2024-06-17 DIAGNOSIS — I10 Essential (primary) hypertension: Secondary | ICD-10-CM

## 2024-06-17 NOTE — Assessment & Plan Note (Signed)
 Resolved BNP was 635-likely related to heart failure related to atrial fibrillation  Lasix  20 mg daily x 2 days and swelling resolved Has not recurred Monitor

## 2024-06-17 NOTE — Patient Instructions (Addendum)
       Medications changes include :   None      Return for follow up as scheduled.

## 2024-06-17 NOTE — Assessment & Plan Note (Signed)
 Chronic TSH was elevated 2 weeks ago Levothyroxine  was increased to 100 mcg 1 day a week and 50 mcg 6 days a week Continue above dose and Will recheck TSH at his next visit

## 2024-06-17 NOTE — Assessment & Plan Note (Signed)
 Chronic Blood pressure well controlled  2 weeks ago BP was on the low side and he was having symptoms consistent with orthostatic hypotension-diltiazem  decreased and he is feeling better Denies any lightheadedness or dizziness BP low at home when he is sitting so need to monitor closely-May need to further decrease blood pressure medication Continue diltiazem  to 180 mg daily, metoprolol  25 mg twice daily Monitor BP at home

## 2024-06-22 ENCOUNTER — Other Ambulatory Visit: Payer: Self-pay

## 2024-06-22 ENCOUNTER — Other Ambulatory Visit: Payer: Self-pay | Admitting: Cardiology

## 2024-06-22 ENCOUNTER — Emergency Department (HOSPITAL_BASED_OUTPATIENT_CLINIC_OR_DEPARTMENT_OTHER)

## 2024-06-22 ENCOUNTER — Encounter (HOSPITAL_BASED_OUTPATIENT_CLINIC_OR_DEPARTMENT_OTHER): Payer: Self-pay | Admitting: Emergency Medicine

## 2024-06-22 ENCOUNTER — Other Ambulatory Visit: Payer: Self-pay | Admitting: Internal Medicine

## 2024-06-22 ENCOUNTER — Emergency Department (HOSPITAL_BASED_OUTPATIENT_CLINIC_OR_DEPARTMENT_OTHER)
Admission: EM | Admit: 2024-06-22 | Discharge: 2024-06-22 | Disposition: A | Discharge status: Home / Self Care | Attending: Emergency Medicine | Admitting: Emergency Medicine

## 2024-06-22 DIAGNOSIS — Z7901 Long term (current) use of anticoagulants: Secondary | ICD-10-CM | POA: Diagnosis not present

## 2024-06-22 DIAGNOSIS — E119 Type 2 diabetes mellitus without complications: Secondary | ICD-10-CM | POA: Diagnosis not present

## 2024-06-22 DIAGNOSIS — I10 Essential (primary) hypertension: Secondary | ICD-10-CM | POA: Insufficient documentation

## 2024-06-22 DIAGNOSIS — Z8673 Personal history of transient ischemic attack (TIA), and cerebral infarction without residual deficits: Secondary | ICD-10-CM | POA: Insufficient documentation

## 2024-06-22 DIAGNOSIS — R1032 Left lower quadrant pain: Secondary | ICD-10-CM | POA: Diagnosis present

## 2024-06-22 DIAGNOSIS — Z79899 Other long term (current) drug therapy: Secondary | ICD-10-CM | POA: Diagnosis not present

## 2024-06-22 DIAGNOSIS — R109 Unspecified abdominal pain: Secondary | ICD-10-CM

## 2024-06-22 LAB — CBC
HCT: 42 % (ref 39.0–52.0)
Hemoglobin: 14 g/dL (ref 13.0–17.0)
MCH: 33 pg (ref 26.0–34.0)
MCHC: 33.3 g/dL (ref 30.0–36.0)
MCV: 99.1 fL (ref 80.0–100.0)
Platelets: 211 K/uL (ref 150–400)
RBC: 4.24 MIL/uL (ref 4.22–5.81)
RDW: 14.6 % (ref 11.5–15.5)
WBC: 8.2 K/uL (ref 4.0–10.5)
nRBC: 0 % (ref 0.0–0.2)

## 2024-06-22 LAB — COMPREHENSIVE METABOLIC PANEL WITH GFR
ALT: 24 U/L (ref 0–44)
AST: 23 U/L (ref 15–41)
Albumin: 4.5 g/dL (ref 3.5–5.0)
Alkaline Phosphatase: 95 U/L (ref 38–126)
Anion gap: 13 (ref 5–15)
BUN: 23 mg/dL (ref 8–23)
CO2: 24 mmol/L (ref 22–32)
Calcium: 10 mg/dL (ref 8.9–10.3)
Chloride: 103 mmol/L (ref 98–111)
Creatinine, Ser: 1.25 mg/dL — ABNORMAL HIGH (ref 0.61–1.24)
GFR, Estimated: 53 mL/min — ABNORMAL LOW (ref >60)
Glucose, Bld: 134 mg/dL — ABNORMAL HIGH (ref 70–99)
Potassium: 4.7 mmol/L (ref 3.5–5.1)
Sodium: 139 mmol/L (ref 135–145)
Total Bilirubin: 0.6 mg/dL (ref 0.0–1.2)
Total Protein: 7.7 g/dL (ref 6.5–8.1)

## 2024-06-22 LAB — URINALYSIS, ROUTINE W REFLEX MICROSCOPIC
Bilirubin Urine: NEGATIVE
Glucose, UA: NEGATIVE mg/dL
Hgb urine dipstick: NEGATIVE
Leukocytes,Ua: NEGATIVE
Nitrite: NEGATIVE
Specific Gravity, Urine: 1.019 (ref 1.005–1.030)
pH: 5 (ref 5.0–8.0)

## 2024-06-22 LAB — LIPASE, BLOOD: Lipase: 13 U/L (ref 11–51)

## 2024-06-22 MED ORDER — IOHEXOL 300 MG/ML  SOLN
80.0000 mL | Freq: Once | INTRAMUSCULAR | Status: AC | PRN
  Administered 2024-06-22: 80 mL via INTRAVENOUS

## 2024-06-22 NOTE — ED Triage Notes (Signed)
 Pt caox4, ambulatory c/o abd pain reporting LLQ abd pain since Sunday, then began radiating to R side, has also had urinary frequency. LBM yest and normal.

## 2024-06-22 NOTE — ED Provider Notes (Signed)
 [place] EMERGENCY DEPARTMENT AT [facility] Provider Note   CSN: [id number] Arrival date & time: 06/22/24  1047     Patient presents with: Abdominal Pain   Brett Franklin is a 89 y.o. male.   Patient here left lower abdominal pain on and off for the last several days.  He says some urinary frequency.  He has history of diabetes hypertension IBS stroke.  Denies any blood in the urine.  Denies any nausea vomiting diarrhea.  No chest pain or shortness of breath.  Nothing makes it worse or better.  He is not having any major pain at this time.  The history is provided by the patient.       Prior to Admission medications   Medication Sig Start Date End Date Taking? Authorizing Provider  Accu-Chek Softclix Lancets lancets FOLLOW PACKAGE DIRECTIONS TO CHECK BLOOD SUGARS DAILY 06/08/24   Geofm Glade PARAS, MD  acetaminophen -codeine  (TYLENOL  #3) 300-30 MG tablet Take 1 tablet by mouth every 8 (eight) hours as needed for moderate pain (pain score 4-6). 04/19/24   Geofm Glade PARAS, MD  Blood Glucose Monitoring Suppl (ACCU-CHEK GUIDE) w/Device KIT USE TO TEST BLOOD SUGAR DAILY 06/22/24   Geofm Glade PARAS, MD  diltiazem  (CARDIZEM  CD) 180 MG 24 hr capsule Take 1 capsule (180 mg total) by mouth daily. 06/02/24   Geofm Glade PARAS, MD  diltiazem  (CARDIZEM  CD) 240 MG 24 hr capsule Take 1 capsule (240 mg total) by mouth daily. 03/10/24   Camnitz, Soyla Lunger, MD  diltiazem  (CARDIZEM ) 30 MG tablet TAKE 1 TABLET BY MOUTH DAILY AS NEEDED 04/19/24   Camnitz, Soyla Lunger, MD  dronedarone  (MULTAQ ) 400 MG tablet Take 1 tablet (400 mg total) by mouth 2 (two) times daily with a meal. 05/31/24   Camnitz, Soyla Lunger, MD  ELIQUIS  5 MG TABS tablet TAKE 1 TABLET(5 MG) BY MOUTH TWICE DAILY 02/01/24   Burns, Glade PARAS, MD  furosemide  (LASIX ) 20 MG tablet TAKE 1 TABLET(20 MG) BY MOUTH DAILY IN THE MORNING 06/06/24   Geofm Glade PARAS, MD  glucose blood test strip Use to test blood sugars once daily. Dx code-E11.49 01/20/24   Geofm Glade PARAS, MD  JANUMET  50-500 MG tablet TAKE 1 TABLET BY MOUTH DAILY. 01/04/24   Geofm Glade PARAS, MD  levothyroxine  (SYNTHROID ) 50 MCG tablet TAKE 1 TABLET(50 MCG) BY MOUTH DAILY 6  DAYS A WEEK.  TAKE 2 TABS PO 1 DAY A WEEK 06/03/24   Burns, Glade PARAS, MD  LORazepam  (ATIVAN ) 0.5 MG tablet TAKE 1 TABLET(0.5 MG) BY MOUTH EVERY 12 HOURS AS NEEDED FOR ANXIETY 04/19/24   Geofm Glade PARAS, MD  metoprolol  tartrate (LOPRESSOR ) 25 MG tablet Take 25 mg by mouth 2 (two) times daily.    [provider]  mometasone (NASONEX) 50 MCG/ACT nasal spray SMARTSIG:2 Spray(s) Both Nares Every Night 09/02/19   [provider]  mupirocin  ointment (BACTROBAN ) 2 % Apply to wound with dressing changes 07/22/23   Geofm Glade PARAS, MD  polyethylene glycol (MIRALAX / GLYCOLAX) 17 g packet Take 17 g by mouth daily.    [provider]  pravastatin  (PRAVACHOL ) 20 MG tablet TAKE 1 TABLET(20 MG) BY MOUTH DAILY 01/04/24   Burns, Glade PARAS, MD  traZODone  (DESYREL ) 50 MG tablet Take 0.5-1 tablets (25-50 mg total) by mouth at bedtime as needed for sleep. 05/18/24   Geofm Glade PARAS, MD    Allergies: Lisinopril    Review of Systems  Updated Vital Signs BP (!) 155/65 (  BP Location: Right Arm)   Pulse 65   Temp (!) 97.5 F (36.4 C) (Oral)   Resp 18   Ht 5' 10 (1.778 m)   Wt 68.9 kg   SpO2 100%   BMI 21.81 kg/m   Physical Exam Vitals and nursing note reviewed.  Constitutional:      General: He is not in acute distress.    Appearance: He is well-developed. He is not ill-appearing.  HENT:     Head: Normocephalic and atraumatic.     Mouth/Throat:     Mouth: Mucous membranes are moist.  Eyes:     Extraocular Movements: Extraocular movements intact.     Conjunctiva/sclera: Conjunctivae normal.     Pupils: Pupils are equal, round, and reactive to light.  Cardiovascular:     Rate and Rhythm: Normal rate and regular rhythm.     Heart sounds: Normal heart sounds. No murmur heard. Pulmonary:     Effort: Pulmonary  effort is normal. No respiratory distress.     Breath sounds: Normal breath sounds.  Abdominal:     Palpations: Abdomen is soft.     Tenderness: There is abdominal tenderness in the left lower quadrant.  Musculoskeletal:        General: No swelling.     Cervical back: Neck supple.  Skin:    General: Skin is warm and dry.     Capillary Refill: Capillary refill takes less than 2 seconds.  Neurological:     Mental Status: He is alert.  Psychiatric:        Mood and Affect: Mood normal.     (all labs ordered are listed, but only abnormal results are displayed) Labs Reviewed  COMPREHENSIVE METABOLIC PANEL WITH GFR - Abnormal; Notable for the following components:      Result Value   Glucose, Bld 134 (*)    Creatinine, Ser 1.25 (*)    GFR, Estimated 53 (*)    All other components within normal limits  URINALYSIS, ROUTINE W REFLEX MICROSCOPIC - Abnormal; Notable for the following components:   Ketones, ur TRACE (*)    Protein, ur TRACE (*)    All other components within normal limits  LIPASE, BLOOD  CBC    EKG: None  Radiology: CT ABDOMEN PELVIS W CONTRAST Result Date: 06/22/2024 CLINICAL DATA:  Acute abdominal pain. EXAM: CT ABDOMEN AND PELVIS WITH CONTRAST TECHNIQUE: Multidetector CT imaging of the abdomen and pelvis was performed using the standard protocol following bolus administration of intravenous contrast. RADIATION DOSE REDUCTION: This exam was performed according to the departmental dose-optimization program which includes automated exposure control, adjustment of the mA and/or kV according to patient size and/or use of iterative reconstruction technique. CONTRAST:  80mL OMNIPAQUE  IOHEXOL  300 MG/ML  SOLN COMPARISON:  CT abdomen and pelvis 06/28/1999 6 FINDINGS: Lower chest: There are minimal patchy ground-glass opacities in the inferior right lower lobe. There are minimal tree-in-bud opacities in the inferior left lower lobe. There some scattered nodular densities in the  left lung base measuring up to 5 mm. Hepatobiliary: Gallbladder is surgically absent. There 2 subcentimeter hypodensities in the liver which are too small to characterize, likely cysts. No biliary ductal dilatation. Pancreas: Unremarkable. No pancreatic ductal dilatation or surrounding inflammatory changes. Spleen: Normal in size without focal abnormality. Adrenals/Urinary Tract: There are numerous bilateral renal cysts. The largest is in the superior pole of the left kidney measuring 8.3 cm. These have increased in size and number when compared to the prior study. There is no  hydronephrosis or perinephric fluid. The adrenal glands and bladder are within normal limits. Stomach/Bowel: Stomach is within normal limits. Appendix appears normal. No evidence of bowel wall thickening, distention, or inflammatory changes. Vascular/Lymphatic: Aortic atherosclerosis. No enlarged abdominal or pelvic lymph nodes. Reproductive: Prostate gland is enlarged. Other: No abdominal wall hernia or abnormality. No abdominopelvic ascites. Musculoskeletal: There are moderate severe degenerative changes of the spine. IMPRESSION: 1. No acute localizing process in the abdomen or pelvis. 2. Minimal patchy ground-glass opacities in the inferior right lower lobe and tree-in-bud opacities in the inferior left lower lobe, likely infectious/inflammatory. 3. Scattered nodular densities in the left lung base measuring up to 5 mm. No follow-up needed if patient is low-risk (and has no known or suspected primary neoplasm). Non-contrast chest CT can be considered in 12 months if patient is high-risk. This recommendation follows the consensus statement: Guidelines for Management of Incidental Pulmonary Nodules Detected on CT Images: From the Fleischner Society 2017; Radiology 2017; 284:228-243. 4. Numerous bilateral renal cysts, increased in size and number when compared to the prior study. 5. Prostatomegaly. 6. Aortic atherosclerosis. Aortic  Atherosclerosis (ICD10-I70.0). Electronically Signed   By: Greig Pique M.D.   On: 06/22/2024 16:35     Procedures   Medications Ordered in the ED  iohexol  (OMNIPAQUE ) 300 MG/ML solution 80 mL (80 mLs Intravenous Contrast Given 06/22/24 1619)                                    Medical Decision Making Amount and/or Complexity of Data Reviewed Labs: ordered. Radiology: ordered.  Risk Prescription drug management.   Brett Franklin is here with left lower quadrant abdominal pain.  History of diabetes hypertension.  Differential diagnosis diverticulitis versus constipation versus MSK process versus UTI versus kidney stone.  Looks like he might have a history of kidney stones per chart review as well.  Will get lab work CT scan urinalysis.  He has declined pain medicine at this time.  Lab work shows no significant leukocytosis anemia or electrolyte abnormality.  Urinalysis negative for infection.  I reviewed interpreted labs and imaging.  Images show no acute processes.  Possible some inflammatory process in the lungs on CT but he is not having any cough or congestion or any discomfort in his chest.  He has got some pulmonary nodules that I made him aware of and talk with his primary care doctor to see if he needed any repeat imaging on that.  But otherwise images unremarkable.  He is pain-free now.  Overall I do suspect may be gas or bowel related discomfort or may be muscular process.  Recommend Tylenol  and MiraLAX and follow-up with primary care.  Told to return if symptoms worsen.  This chart was dictated using voice recognition software.  Despite best efforts to proofread,  errors can occur which can change the documentation meaning.      Final diagnoses:  Abdominal pain, unspecified abdominal location    ED Discharge Orders     None          Ruthe Cornet, DO 06/22/24 1644

## 2024-06-28 ENCOUNTER — Ambulatory Visit (INDEPENDENT_AMBULATORY_CARE_PROVIDER_SITE_OTHER): Admitting: Podiatry

## 2024-06-28 DIAGNOSIS — M79676 Pain in unspecified toe(s): Secondary | ICD-10-CM

## 2024-06-28 DIAGNOSIS — B351 Tinea unguium: Secondary | ICD-10-CM

## 2024-06-28 DIAGNOSIS — D2371 Other benign neoplasm of skin of right lower limb, including hip: Secondary | ICD-10-CM

## 2024-06-28 DIAGNOSIS — D2372 Other benign neoplasm of skin of left lower limb, including hip: Secondary | ICD-10-CM | POA: Diagnosis not present

## 2024-06-28 NOTE — Progress Notes (Signed)
 He presents today with chief complaint of painful calluses bilaterally painful elongated toenails.  Incurvated margins.  Objective: Vital signs are stable he is alert and oriented x 3.  Pulses are palpable no open lesions or wounds.  Benign hyperkeratotic lesion plantar aspect of the bilateral foot particularly beneath the fifth metatarsal heads bilaterally.  Toenails are thick and slightly elongated and tender on palpation extremities: Debridement.  Assessment: Pain in limb secondary to benign skin lesions and ingrown toenails.  Plan: Debridement of benign skin lesions debridement of toenails 1 through 5 bilateral, toes.

## 2024-06-29 ENCOUNTER — Other Ambulatory Visit: Payer: Self-pay | Admitting: Internal Medicine

## 2024-06-29 DIAGNOSIS — R5381 Other malaise: Secondary | ICD-10-CM

## 2024-06-29 DIAGNOSIS — G8929 Other chronic pain: Secondary | ICD-10-CM

## 2024-06-29 DIAGNOSIS — R2689 Other abnormalities of gait and mobility: Secondary | ICD-10-CM

## 2024-06-30 ENCOUNTER — Other Ambulatory Visit: Payer: Self-pay | Admitting: Internal Medicine

## 2024-07-01 ENCOUNTER — Other Ambulatory Visit: Payer: Self-pay | Admitting: Internal Medicine

## 2024-07-04 ENCOUNTER — Encounter: Payer: Self-pay | Admitting: Internal Medicine

## 2024-07-04 DIAGNOSIS — R1032 Left lower quadrant pain: Secondary | ICD-10-CM | POA: Insufficient documentation

## 2024-07-04 NOTE — Progress Notes (Unsigned)
 Subjective:    Patient ID: Brett Franklin, male    DOB: 06-12-1926, 89 y.o.   MRN: [id number]     HPI Brett Franklin is here for follow up from the hospital  ED 06/10/2024: Presented with left lower abdominal pain intermittent for several days.  He also stated urinary frequency.  He denied any blood in the urine, nausea/vomiting, diarrhea, chest pain or shortness of breath.  CBC, CMP, lipase and UA unremarkable.  CT abdomen pelvis without acute process.  Minimal patchy ground glass opacities in the inferior RLL and tree-in-bud opacities in the inferior LLL-likely infectious or inflammatory.  Scattered nodular densities in the left lung base measuring up to 5 mm.  No follow-up needed is low risk.  Numerous bilateral renal cysts increased in size and number compared to prior study.  Prostatomegaly.  Aortic atherosclerosis.  In the ED upon reevaluation he was pain-free.  Thought to be possibly related to gas or bowel related discomfort versus muscular process.  He was recommended Tylenol  and MiraLAX.      Medications and allergies reviewed with patient and updated if appropriate.  Current Outpatient Medications on File Prior to Visit  Medication Sig Dispense Refill   Accu-Chek Softclix Lancets lancets FOLLOW PACKAGE DIRECTIONS TO CHECK BLOOD SUGARS DAILY 100 each 1   acetaminophen -codeine  (TYLENOL  #3) 300-30 MG tablet Take 1 tablet by mouth every 8 (eight) hours as needed for moderate pain (pain score 4-6). 15 tablet 0   Blood Glucose Monitoring Suppl (ACCU-CHEK GUIDE) w/Device KIT USE TO TEST BLOOD SUGAR DAILY 1 kit 0   diltiazem  (CARDIZEM  CD) 180 MG 24 hr capsule Take 1 capsule (180 mg total) by mouth daily. 30 capsule 5   diltiazem  (CARDIZEM  CD) 240 MG 24 hr capsule Take 1 capsule (240 mg total) by mouth daily. 90 capsule 1   diltiazem  (CARDIZEM ) 30 MG tablet TAKE 1 TABLET BY MOUTH DAILY AS NEEDED 90 tablet 2   dronedarone  (MULTAQ ) 400 MG tablet Take 1 tablet (400 mg total) by mouth 2  (two) times daily with a meal. 180 tablet 3   ELIQUIS  5 MG TABS tablet TAKE 1 TABLET(5 MG) BY MOUTH TWICE DAILY 180 tablet 3   furosemide  (LASIX ) 20 MG tablet TAKE 1 TABLET(20 MG) BY MOUTH DAILY IN THE MORNING 2 tablet 0   glucose blood test strip Use to test blood sugars once daily. Dx code-E11.49 100 each 3   JANUMET  50-500 MG tablet TAKE 1 TABLET BY MOUTH DAILY 90 tablet 1   levothyroxine  (SYNTHROID ) 50 MCG tablet TAKE 1 TABLET(50 MCG) BY MOUTH DAILY 6  DAYS A WEEK.  TAKE 2 TABS PO 1 DAY A WEEK     LORazepam  (ATIVAN ) 0.5 MG tablet TAKE 1 TABLET(0.5 MG) BY MOUTH EVERY 12 HOURS AS NEEDED FOR ANXIETY 20 tablet 5   metoprolol  tartrate (LOPRESSOR ) 25 MG tablet Take 25 mg by mouth 2 (two) times daily.     mometasone (NASONEX) 50 MCG/ACT nasal spray SMARTSIG:2 Spray(s) Both Nares Every Night     mupirocin  ointment (BACTROBAN ) 2 % Apply to wound with dressing changes 30 g 1   polyethylene glycol (MIRALAX / GLYCOLAX) 17 g packet Take 17 g by mouth daily.     pravastatin  (PRAVACHOL ) 20 MG tablet TAKE 1 TABLET(20 MG) BY MOUTH DAILY 90 tablet 1   traZODone  (DESYREL ) 50 MG tablet Take 0.5-1 tablets (25-50 mg total) by mouth at bedtime as needed for sleep. 30 tablet 3   No current facility-administered medications  on file prior to visit.     Review of Systems     Objective:  There were no vitals filed for this visit. BP Readings from Last 3 Encounters:  06/22/24 (!) 142/68  06/17/24 132/64  06/02/24 128/70   Wt Readings from Last 3 Encounters:  06/22/24 152 lb (68.9 kg)  06/17/24 146 lb (66.2 kg)  06/02/24 152 lb (68.9 kg)   There is no height or weight on file to calculate BMI.    Physical Exam     Lab Results  Component Value Date   WBC 8.2 06/22/2024   HGB 14.0 06/22/2024   HCT 42.0 06/22/2024   PLT 211 06/22/2024   GLUCOSE 134 (H) 06/22/2024   CHOL 93 01/04/2024   TRIG 55.0 01/04/2024   HDL 55.60 01/04/2024   LDLCALC 26 01/04/2024   ALT 24 06/22/2024   AST 23 06/22/2024    NA 139 06/22/2024   K 4.7 06/22/2024   CL 103 06/22/2024   CREATININE 1.25 (H) 06/22/2024   BUN 23 06/22/2024   CO2 24 06/22/2024   TSH 6.54 (H) 06/02/2024   INR 0.99 12/21/2009   HGBA1C 7.1 (H) 06/02/2024   MICROALBUR 1.0 04/11/2010     Assessment & Plan:    See Problem List for Assessment and Plan of chronic medical problems.

## 2024-07-05 ENCOUNTER — Ambulatory Visit: Admitting: Internal Medicine

## 2024-07-05 VITALS — BP 130/78 | HR 51 | Temp 98.2°F | Ht 70.0 in | Wt 145.0 lb

## 2024-07-05 DIAGNOSIS — Z23 Encounter for immunization: Secondary | ICD-10-CM | POA: Diagnosis not present

## 2024-07-05 DIAGNOSIS — E039 Hypothyroidism, unspecified: Secondary | ICD-10-CM

## 2024-07-05 DIAGNOSIS — R1032 Left lower quadrant pain: Secondary | ICD-10-CM

## 2024-07-05 DIAGNOSIS — E785 Hyperlipidemia, unspecified: Secondary | ICD-10-CM

## 2024-07-05 DIAGNOSIS — F419 Anxiety disorder, unspecified: Secondary | ICD-10-CM

## 2024-07-05 DIAGNOSIS — Z7984 Long term (current) use of oral hypoglycemic drugs: Secondary | ICD-10-CM

## 2024-07-05 DIAGNOSIS — I152 Hypertension secondary to endocrine disorders: Secondary | ICD-10-CM

## 2024-07-05 DIAGNOSIS — R053 Chronic cough: Secondary | ICD-10-CM

## 2024-07-05 DIAGNOSIS — E1159 Type 2 diabetes mellitus with other circulatory complications: Secondary | ICD-10-CM

## 2024-07-05 DIAGNOSIS — E1169 Type 2 diabetes mellitus with other specified complication: Secondary | ICD-10-CM

## 2024-07-05 DIAGNOSIS — I480 Paroxysmal atrial fibrillation: Secondary | ICD-10-CM

## 2024-07-05 MED ORDER — AMOXICILLIN-POT CLAVULANATE 875-125 MG PO TABS: 1.0000 | ORAL_TABLET | Freq: Two times a day (BID) | ORAL | 0 refills | Status: AC

## 2024-07-05 NOTE — Assessment & Plan Note (Signed)
 Chronic Has seen ENT and pulmonary Deferred going to see speech therapy Recent CT scan showed opacities bilateral lower lungs-likely infectious versus inflammatory Will try 10 days of Augmentin  twice daily-has taken this in the past and done well.  Stressed to him and his wife if there are any side effects he needs to let me know right away

## 2024-07-05 NOTE — Assessment & Plan Note (Signed)
 Chronic Situational We agree that he will only take this at extreme cases of anxiety-has not taken it in a while.  Discussed possible increase of memory issues but increased risk of falls Continue lorazepam  0.5 mg twice daily as needed

## 2024-07-05 NOTE — Assessment & Plan Note (Signed)
 Chronic Associated with hyperlipidemia Lab Results  Component Value Date   HGBA1C 7.1 (H) 06/02/2024   Sugars well controlled for age Continue Janumet  50-500 mg daily Continue regular exercise, diabetic diet

## 2024-07-05 NOTE — Assessment & Plan Note (Signed)
 Chronic Regular exercise and healthy diet encouraged Lab Results  Component Value Date   LDLCALC 26 01/04/2024   Continue pravastatin  20 mg daily

## 2024-07-05 NOTE — Assessment & Plan Note (Signed)
 Chronic Blood pressure well controlled  Denies any lightheadedness or dizziness Continue diltiazem  to 180 mg daily, metoprolol  25 mg twice daily Monitor BP at home

## 2024-07-05 NOTE — Patient Instructions (Addendum)
    Flu immunization administered today.      Medications changes include :   Augmentin  twice daily x 10 days       Return in about 3 months (around 10/05/2024) for follow up, cancel 10/23 appt.

## 2024-07-05 NOTE — Assessment & Plan Note (Signed)
 Resolved Had left lower quadrant abdominal pain for 3-4 days Workup in emergency including blood work and CT abdomen pelvis was negative for cause and pain resolved while he was in the emergency room Has not had any recurrence so no further evaluation or treatment is necessary

## 2024-07-05 NOTE — Assessment & Plan Note (Signed)
 Chronic Follows with cardiology On Eliquis  5 mg twice daily, Multaq  400 mg twice daily, diltiazem  240 mg daily, metoprolol  25 mg daily Has diltiazem  30 mg prn for episodes of A-fib if needed

## 2024-07-05 NOTE — Assessment & Plan Note (Signed)
 Chronic TSH was elevated 2 weeks ago Levothyroxine  was increased to 100 mcg 1 day a week and 50 mcg 6 days a week Continue above dose and Will recheck TSH at his next visit

## 2024-07-20 DIAGNOSIS — R531 Weakness: Secondary | ICD-10-CM | POA: Insufficient documentation

## 2024-07-20 DIAGNOSIS — R262 Difficulty in walking, not elsewhere classified: Secondary | ICD-10-CM | POA: Insufficient documentation

## 2024-07-21 ENCOUNTER — Ambulatory Visit: Admitting: Internal Medicine

## 2024-07-26 ENCOUNTER — Encounter: Payer: Self-pay | Admitting: Podiatry

## 2024-07-26 ENCOUNTER — Ambulatory Visit: Admitting: Podiatry

## 2024-07-26 DIAGNOSIS — B351 Tinea unguium: Secondary | ICD-10-CM | POA: Diagnosis not present

## 2024-07-26 DIAGNOSIS — E1142 Type 2 diabetes mellitus with diabetic polyneuropathy: Secondary | ICD-10-CM | POA: Diagnosis not present

## 2024-07-26 DIAGNOSIS — M7751 Other enthesopathy of right foot: Secondary | ICD-10-CM

## 2024-07-26 DIAGNOSIS — D689 Coagulation defect, unspecified: Secondary | ICD-10-CM | POA: Diagnosis not present

## 2024-07-26 DIAGNOSIS — D2372 Other benign neoplasm of skin of left lower limb, including hip: Secondary | ICD-10-CM | POA: Diagnosis not present

## 2024-07-26 DIAGNOSIS — M79676 Pain in unspecified toe(s): Secondary | ICD-10-CM | POA: Diagnosis not present

## 2024-07-26 DIAGNOSIS — M7752 Other enthesopathy of left foot: Secondary | ICD-10-CM | POA: Diagnosis not present

## 2024-07-26 DIAGNOSIS — D2371 Other benign neoplasm of skin of right lower limb, including hip: Secondary | ICD-10-CM

## 2024-07-26 MED ORDER — KETOCONAZOLE 2 % EX CREA: 1.0000 | TOPICAL_CREAM | Freq: Two times a day (BID) | CUTANEOUS | 2 refills | Status: DC

## 2024-07-26 MED ORDER — DEXAMETHASONE SODIUM PHOSPHATE 120 MG/30ML IJ SOLN
4.0000 mg | Freq: Once | INTRAMUSCULAR | Status: AC
  Administered 2024-07-26: 4 mg via INTRA_ARTICULAR

## 2024-07-27 NOTE — Progress Notes (Signed)
 He presents today chief complaint of painful elongated toenails and painful calluses plantar aspect of the bilateral fifth metatarsals.  He states that I think he may need another injection and there.  Objective: Vital signs are stable alert and oriented x 3.  He does have fluctuance on palpation of the fifth metatarsal head plantarly consistent with bursitis.  Left appears to be worse than that of the right.  There is mild overlying benign skin hypertrophy or benign skin neoplasm.  Toenails are long sharply incurvated dystrophic.  Assessment: Pain in limb secondary to onychomycosis bursitis and benign skin lesions bilateral.  Plan: Debridement of benign skin lesions bilateral.  Debridement of toenails 1-5 bilateral also injected bursitis today 2 mg injected beneath the fifth metatarsal heads bilaterally.  Tolerated procedure well follow-up with him in the near future.

## 2024-08-02 ENCOUNTER — Encounter (HOSPITAL_COMMUNITY): Payer: Self-pay | Admitting: Internal Medicine

## 2024-08-02 ENCOUNTER — Ambulatory Visit (HOSPITAL_COMMUNITY)
Admission: RE | Admit: 2024-08-02 | Discharge: 2024-08-02 | Disposition: A | Discharge status: Home / Self Care | Source: Ambulatory Visit | Attending: Internal Medicine | Admitting: Internal Medicine

## 2024-08-02 VITALS — BP 122/60 | HR 61 | Ht 70.0 in | Wt 148.8 lb

## 2024-08-02 DIAGNOSIS — D6869 Other thrombophilia: Secondary | ICD-10-CM

## 2024-08-02 DIAGNOSIS — I480 Paroxysmal atrial fibrillation: Secondary | ICD-10-CM

## 2024-08-02 DIAGNOSIS — Z79899 Other long term (current) drug therapy: Secondary | ICD-10-CM

## 2024-08-02 DIAGNOSIS — Z5181 Encounter for therapeutic drug level monitoring: Secondary | ICD-10-CM | POA: Diagnosis not present

## 2024-08-02 NOTE — Progress Notes (Signed)
 Primary Care Physician: Brett Franklin Primary Cardiologist: None Electrophysiologist: Brett Franklin     Referring Physician: Dr. Norton Brett Franklin Brett is a 89 y.o. male with a history of HTN, CVA, aortic atherosclerosis by CT imaging, diabetes, and atrial fibrillation who presents for consultation in the [facility]. Seen by Dr. Norton on 8/5 and transitioned from Multaq  to amiodarone  but he had adverse effects so switched back to Multaq . Patient is on Eliquis  for stroke prevention.  On evaluation today, patient is currently in NSR. He and wife notes for 5 days patient was mistakenly taking Multaq  and amiodarone  together. He then realized his error and took amiodarone  alone, still noting adverse effects. He states he feels well overall on the Multaq .   Today, he denies symptoms of palpitations, chest pain, shortness of breath, orthopnea, PND, lower extremity edema, dizziness, presyncope, syncope, bleeding, or neurologic sequela. The patient is tolerating medications without difficulties and is otherwise without complaint today.    he has a BMI of Body mass index is 21.35 kg/m.SABRA Filed Weights   08/02/24 0923  Weight: 67.5 kg    Current Outpatient Medications  Medication Sig Dispense Refill   Accu-Chek Softclix Lancets lancets FOLLOW PACKAGE DIRECTIONS TO CHECK BLOOD SUGARS DAILY 100 each 1   acetaminophen -codeine  (TYLENOL  #3) 300-30 MG tablet Take 1 tablet by mouth every 8 (eight) hours as needed for moderate pain (pain score 4-6). 15 tablet 0   Blood Glucose Monitoring Suppl (ACCU-CHEK GUIDE) w/Device KIT USE TO TEST BLOOD SUGAR DAILY 1 kit 0   diltiazem  (CARDIZEM  CD) 180 MG 24 hr capsule Take 1 capsule (180 mg total) by mouth daily. 30 capsule 5   diltiazem  (CARDIZEM ) 30 MG tablet TAKE 1 TABLET BY MOUTH DAILY AS NEEDED 90 tablet 2   dronedarone  (MULTAQ ) 400 MG tablet Take 1 tablet (400 mg total) by mouth 2 (two) times daily with a  meal. 180 tablet 3   ELIQUIS  5 MG TABS tablet TAKE 1 TABLET(5 MG) BY MOUTH TWICE DAILY 180 tablet 3   furosemide  (LASIX ) 20 MG tablet TAKE 1 TABLET(20 MG) BY MOUTH DAILY IN THE MORNING 2 tablet 0   glucose blood test strip Use to test blood sugars once daily. Dx code-E11.49 100 each 3   JANUMET  50-500 MG tablet TAKE 1 TABLET BY MOUTH DAILY 90 tablet 1   ketoconazole (NIZORAL) 2 % cream Apply 1 Application topically 2 (two) times daily. 15 g 2   levothyroxine  (SYNTHROID ) 50 MCG tablet TAKE 1 TABLET(50 MCG) BY MOUTH DAILY 6  DAYS A WEEK.  TAKE 2 TABS PO 1 DAY A WEEK     LORazepam  (ATIVAN ) 0.5 MG tablet TAKE 1 TABLET(0.5 MG) BY MOUTH EVERY 12 HOURS AS NEEDED FOR ANXIETY 20 tablet 5   metoprolol  tartrate (LOPRESSOR ) 25 MG tablet Take 25 mg by mouth 2 (two) times daily.     mometasone (NASONEX) 50 MCG/ACT nasal spray SMARTSIG:2 Spray(s) Both Nares Every Night     mupirocin  ointment (BACTROBAN ) 2 % Apply to wound with dressing changes 30 g 1   polyethylene glycol (MIRALAX / GLYCOLAX) 17 g packet Take 17 g by mouth daily.     pravastatin  (PRAVACHOL ) 20 MG tablet TAKE 1 TABLET(20 MG) BY MOUTH DAILY 90 tablet 1   valACYclovir (VALTREX) 1000 MG tablet Take 4,000 mg by mouth once.     No current facility-administered medications for this encounter.    Atrial Fibrillation Management history:  Previous antiarrhythmic drugs: Multaq , amiodarone  Previous cardioversions: none Previous ablations: none Anticoagulation history: Eliquis    ROS- All systems are reviewed and negative except as per the HPI above.  Physical Exam: BP 122/60   Pulse 61   Ht 5' 10 (1.778 m)   Wt 67.5 kg   BMI 21.35 kg/m   GEN: Well nourished, well developed in no acute distress NECK: No JVD; No carotid bruits CARDIAC: Regular rate and rhythm, no murmurs, rubs, gallops RESPIRATORY:  Clear to auscultation without rales, wheezing or rhonchi  ABDOMEN: Soft, non-tender, non-distended EXTREMITIES:  No edema; No deformity    EKG today demonstrates  Vent. rate 61 BPM PR interval 212 ms QRS duration 138 ms QT/QTcB 476/479 ms P-R-T axes 83 95 58 Sinus rhythm with 1st degree A-V block Right bundle branch block Abnormal ECG When compared with ECG of 03-May-2024 11:32, Premature ventricular complexes are no longer Present   ASSESSMENT & PLAN CHA2DS2-VASc Score = 6  The patient's score is based upon: CHF History: 0 HTN History: 1 Diabetes History: 1 Stroke History: 2 Vascular Disease History: 0 Age Score: 2 Gender Score: 0       ASSESSMENT AND PLAN: Paroxysmal Atrial Fibrillation (ICD10:  I48.0) The patient's CHA2DS2-VASc score is 6, indicating a 9.7% annual risk of stroke.    Patient is currently in NSR. He is happy with overall management.  High risk medication monitoring (ICD10: J342684) Patient requires ongoing monitoring for anti-arrhythmic medication which has the potential to cause life threatening arrhythmias or AV block. ECG intervals are stable. Continue Multaq  400 mg BID.   Secondary Hypercoagulable State (ICD10:  D68.69) The patient is at significant risk for stroke/thromboembolism based upon his CHA2DS2-VASc Score of 6.  Continue Apixaban  (Eliquis ).  Continue Eliquis .   Follow up 6 months for Multaq  surveillance.   [facility], [facility] [address] [place], [place] [address] [phone]

## 2024-09-01 ENCOUNTER — Ambulatory Visit: Admitting: Podiatry

## 2024-09-01 ENCOUNTER — Encounter: Payer: Self-pay | Admitting: Podiatry

## 2024-09-01 DIAGNOSIS — B351 Tinea unguium: Secondary | ICD-10-CM | POA: Diagnosis not present

## 2024-09-01 DIAGNOSIS — D689 Coagulation defect, unspecified: Secondary | ICD-10-CM | POA: Diagnosis not present

## 2024-09-01 DIAGNOSIS — D2372 Other benign neoplasm of skin of left lower limb, including hip: Secondary | ICD-10-CM

## 2024-09-01 DIAGNOSIS — M79676 Pain in unspecified toe(s): Secondary | ICD-10-CM

## 2024-09-01 MED ORDER — KETOCONAZOLE 2 % EX CREA: 1.0000 | TOPICAL_CREAM | Freq: Two times a day (BID) | CUTANEOUS | 5 refills | Status: AC

## 2024-09-01 NOTE — Progress Notes (Signed)
 He presents today with chief complaint of painful calluses bilaterally painful elongated toenails.  Incurvated margins.  Objective: Vital signs are stable he is alert and oriented x 3.  Pulses are palpable no open lesions or wounds.  Benign hyperkeratotic lesion plantar aspect of the bilateral foot particularly beneath the fifth metatarsal heads bilaterally.  Toenails are thick and slightly elongated and tender on palpation extremities: Debridement.  Assessment: Pain in limb secondary to benign skin lesions and ingrown toenails.  Plan: Debridement of benign skin lesions debridement of toenails 1 through 5 bilateral, toes.

## 2024-09-23 ENCOUNTER — Other Ambulatory Visit: Payer: Self-pay | Admitting: Internal Medicine

## 2024-10-06 ENCOUNTER — Other Ambulatory Visit: Payer: Self-pay

## 2024-10-06 ENCOUNTER — Encounter (HOSPITAL_BASED_OUTPATIENT_CLINIC_OR_DEPARTMENT_OTHER): Payer: Self-pay | Admitting: Emergency Medicine

## 2024-10-06 ENCOUNTER — Encounter: Payer: Self-pay | Admitting: Podiatry

## 2024-10-06 ENCOUNTER — Ambulatory Visit: Admitting: Podiatry

## 2024-10-06 ENCOUNTER — Emergency Department (HOSPITAL_BASED_OUTPATIENT_CLINIC_OR_DEPARTMENT_OTHER)
Admission: EM | Admit: 2024-10-06 | Discharge: 2024-10-06 | Disposition: A | Discharge status: Home / Self Care | Attending: Emergency Medicine | Admitting: Emergency Medicine

## 2024-10-06 ENCOUNTER — Emergency Department (HOSPITAL_BASED_OUTPATIENT_CLINIC_OR_DEPARTMENT_OTHER)

## 2024-10-06 DIAGNOSIS — D2371 Other benign neoplasm of skin of right lower limb, including hip: Secondary | ICD-10-CM

## 2024-10-06 DIAGNOSIS — D689 Coagulation defect, unspecified: Secondary | ICD-10-CM | POA: Diagnosis not present

## 2024-10-06 DIAGNOSIS — S0990XA Unspecified injury of head, initial encounter: Secondary | ICD-10-CM | POA: Diagnosis present

## 2024-10-06 DIAGNOSIS — B351 Tinea unguium: Secondary | ICD-10-CM

## 2024-10-06 DIAGNOSIS — E1142 Type 2 diabetes mellitus with diabetic polyneuropathy: Secondary | ICD-10-CM | POA: Diagnosis not present

## 2024-10-06 DIAGNOSIS — M7751 Other enthesopathy of right foot: Secondary | ICD-10-CM | POA: Diagnosis not present

## 2024-10-06 DIAGNOSIS — Z7901 Long term (current) use of anticoagulants: Secondary | ICD-10-CM | POA: Insufficient documentation

## 2024-10-06 DIAGNOSIS — W01198A Fall on same level from slipping, tripping and stumbling with subsequent striking against other object, initial encounter: Secondary | ICD-10-CM | POA: Diagnosis not present

## 2024-10-06 DIAGNOSIS — M79676 Pain in unspecified toe(s): Secondary | ICD-10-CM | POA: Diagnosis not present

## 2024-10-06 DIAGNOSIS — Y92810 Car as the place of occurrence of the external cause: Secondary | ICD-10-CM | POA: Diagnosis not present

## 2024-10-06 DIAGNOSIS — D2372 Other benign neoplasm of skin of left lower limb, including hip: Secondary | ICD-10-CM | POA: Diagnosis not present

## 2024-10-06 DIAGNOSIS — M7752 Other enthesopathy of left foot: Secondary | ICD-10-CM

## 2024-10-06 DIAGNOSIS — S0001XA Abrasion of scalp, initial encounter: Secondary | ICD-10-CM

## 2024-10-06 DIAGNOSIS — S0083XA Contusion of other part of head, initial encounter: Secondary | ICD-10-CM | POA: Diagnosis not present

## 2024-10-06 MED ORDER — DEXAMETHASONE SODIUM PHOSPHATE 120 MG/30ML IJ SOLN
4.0000 mg | Freq: Once | INTRAMUSCULAR | Status: AC
  Administered 2024-10-06: 4 mg via INTRA_ARTICULAR

## 2024-10-06 NOTE — ED Triage Notes (Signed)
 Mechanical fall this morning. Hit head. Takes eliquis .  Denies LOC.

## 2024-10-06 NOTE — ED Notes (Signed)
"  Wound care provided   "

## 2024-10-06 NOTE — Progress Notes (Signed)
 He presents today chief complaint of painful elongated toenails and painful calluses plantar aspect of the bilateral fifth metatarsals.  He states that I think he may need another injection and there.  Objective: Vital signs are stable alert and oriented x 3.  He does have fluctuance on palpation of the fifth metatarsal head plantarly consistent with bursitis.  Left appears to be worse than that of the right.  There is mild overlying benign skin hypertrophy or benign skin neoplasm.  Toenails are long sharply incurvated dystrophic.  Assessment: Pain in limb secondary to onychomycosis bursitis and benign skin lesions bilateral.  Plan: Debridement of benign skin lesions bilateral.  Debridement of toenails 1-5 bilateral also injected bursitis today 2 mg injected beneath the fifth metatarsal heads bilaterally.  Tolerated procedure well follow-up with him in the near future.

## 2024-10-06 NOTE — ED Notes (Signed)
 Reviewed AVS/discharge instruction with patient. Time allotted for and all questions answered. Patient is agreeable for d/c and escorted to ed exit by staff.

## 2024-10-06 NOTE — Discharge Instructions (Signed)
 Your CT scan was negative Please read and follow the wound care instructions in the attachment Get help right away for any new or worsening symptoms

## 2024-10-06 NOTE — ED Provider Notes (Signed)
 " Brett Franklin   CSN: [id number] Arrival date & time: 10/06/24  1353     Patient presents with: Brett Franklin is a 89 y.o. male who presents emergency department for evaluation of head injury.  Patient is on Eliquis .  He reports that he had his hand on the car and was stepping off a curb to get into his vehicle but lost his footing fell backward and then hit his head on the concrete.  He denies loss of consciousness, headache, vision change.  He denies syncope.  He denies any neck pain weakness numbness in his extremities.  He has balance issues at baseline.  He denies any pain in his tailbone or extremities. Patient unsure of last tetanus vaccination but thinks he is up-to-date.  Patient declined Tylenol  and an ice pack.    Fall       Prior to Admission medications  Medication Sig Start Date End Date Taking? Authorizing Provider  Accu-Chek Softclix Lancets lancets FOLLOW PACKAGE DIRECTIONS TO CHECK BLOOD SUGARS DAILY 09/26/24   Geofm Glade PARAS, MD  acetaminophen -codeine  (TYLENOL  #3) 300-30 MG tablet Take 1 tablet by mouth every 8 (eight) hours as needed for moderate pain (pain score 4-6). 04/19/24   Geofm Glade PARAS, MD  Blood Glucose Monitoring Suppl (ACCU-CHEK GUIDE) w/Device KIT USE TO TEST BLOOD SUGAR DAILY 09/26/24   Geofm Glade PARAS, MD  diltiazem  (CARDIZEM  CD) 180 MG 24 hr capsule Take 1 capsule (180 mg total) by mouth daily. 06/02/24   Geofm Glade PARAS, MD  diltiazem  (CARDIZEM ) 30 MG tablet TAKE 1 TABLET BY MOUTH DAILY AS NEEDED 04/19/24   Camnitz, Soyla Lunger, MD  dronedarone  (MULTAQ ) 400 MG tablet Take 1 tablet (400 mg total) by mouth 2 (two) times daily with a meal. 05/31/24   Camnitz, Will Lunger, MD  ELIQUIS  5 MG TABS tablet TAKE 1 TABLET(5 MG) BY MOUTH TWICE DAILY 02/01/24   Burns, Glade PARAS, MD  furosemide  (LASIX ) 20 MG tablet TAKE 1 TABLET(20 MG) BY MOUTH DAILY IN THE MORNING 06/06/24   Geofm Glade PARAS, MD  glucose blood test  strip Use to test blood sugars once daily. Dx code-E11.49 01/20/24   Geofm Glade PARAS, MD  JANUMET  50-500 MG tablet TAKE 1 TABLET BY MOUTH DAILY 07/01/24   Geofm Glade PARAS, MD  ketoconazole  (NIZORAL ) 2 % cream Apply 1 Application topically 2 (two) times daily. 09/01/24   Hyatt, Max T, DPM  levothyroxine  (SYNTHROID ) 50 MCG tablet TAKE 1 TABLET(50 MCG) BY MOUTH DAILY BEFORE BREAKFAST 09/26/24   Burns, Glade PARAS, MD  LORazepam  (ATIVAN ) 0.5 MG tablet TAKE 1 TABLET(0.5 MG) BY MOUTH EVERY 12 HOURS AS NEEDED FOR ANXIETY 04/19/24   Geofm Glade PARAS, MD  metoprolol  tartrate (LOPRESSOR ) 25 MG tablet Take 25 mg by mouth 2 (two) times daily.    [provider]  mometasone (NASONEX) 50 MCG/ACT nasal spray SMARTSIG:2 Spray(s) Both Nares Every Night 09/02/19   [provider]  mupirocin  ointment (BACTROBAN ) 2 % Apply to wound with dressing changes 07/22/23   Geofm Glade PARAS, MD  polyethylene glycol (MIRALAX / GLYCOLAX) 17 g packet Take 17 g by mouth daily.    [provider]  pravastatin  (PRAVACHOL ) 20 MG tablet TAKE 1 TABLET(20 MG) BY MOUTH DAILY 06/30/24   Burns, Glade PARAS, MD  valACYclovir (VALTREX) 1000 MG tablet Take 4,000 mg by mouth once. 07/20/24   [provider]    Allergies: Lisinopril  Review of Systems  Updated Vital Signs BP (!) 162/69   Pulse 76   Temp 98.9 F (37.2 C)   Resp 18   SpO2 98%   Physical Exam Constitutional:      Appearance: Normal appearance. He is well-groomed. He is not ill-appearing.  HENT:     Head: Normocephalic. Abrasion present.      Comments: Abrasion and contusion posterior occiput, no active bleeding at this time Musculoskeletal:     Comments: No deformities in the extremities.  Small bruise on the left thenar eminence. Moves all extremities without ataxia   Neurological:     General: No focal deficit present.     Mental Status: He is alert.     GCS: GCS eye subscore is 4. GCS verbal subscore is 5. GCS motor subscore is 6.      Cranial Nerves: Cranial nerves 2-12 are intact.     Sensory: Sensation is intact.     Motor: Motor function is intact.     Coordination: Coordination is intact.  Psychiatric:        Behavior: Behavior is cooperative.     (all labs ordered are listed, but only abnormal results are displayed) Labs Reviewed - No data to display  EKG: None  Radiology: No results found.   Procedures   Medications Ordered in the ED - No data to display                                  Medical Decision Making Amount and/or Complexity of Data Reviewed Radiology: ordered.  Last tetanus vaccine 06/23/2022   I personally visualized and interpreted CT head and C-spine which shows no acute findings.  Patient here with mechanical fall, abrasion to the posterior occiput. He has no signs of serious head injury or neck injury.  Although patient is on Eliquis  he has no evidence of subdural hematoma or other intracranial pathology.  Appears appropriate for discharge at this time.  Ambulating at baseline.      Final diagnoses:  None    ED Discharge Orders     None          Arloa Chroman, PA-C 10/06/24 1553  "

## 2024-10-07 ENCOUNTER — Ambulatory Visit: Admitting: Internal Medicine

## 2024-10-07 LAB — OPHTHALMOLOGY REPORT-SCANNED

## 2024-10-09 ENCOUNTER — Encounter: Payer: Self-pay | Admitting: Internal Medicine

## 2024-10-09 NOTE — Progress Notes (Unsigned)
 "     Subjective:    Patient ID: Brett Franklin, male    DOB: 30-Jan-1926, 89 y.o.   MRN: [id number]     HPI Brett Franklin is here for follow up of his chronic medical problems.  1/8 - ED - he fell.  He was getting into his car and fell - he is not sure what happened.  He has an abrasion on the posterior head.  Ct head and Ct c-spine were negative for acute injuries.  He denies headaches.     Last saw urology three years ago - having bladder / urinary issues.  He would like to be referred back to urology.   Medications and allergies reviewed with patient and updated if appropriate.  Medications Ordered Prior to Encounter[1]   Review of Systems  Constitutional:  Negative for fever.  Eyes:  Negative for visual disturbance.  Respiratory:  Positive for cough (chronic - after he eats). Negative for choking, shortness of breath and wheezing.   Cardiovascular:  Negative for chest pain, palpitations and leg swelling.  Gastrointestinal:  Negative for nausea.  Neurological:  Negative for dizziness, light-headedness and headaches.       Objective:   Vitals:   10/10/24 0822  BP: 128/68  Pulse: 78  Temp: 98 F (36.7 C)  SpO2: 98%   BP Readings from Last 3 Encounters:  10/10/24 128/68  10/06/24 (!) 149/77  08/02/24 122/60   Wt Readings from Last 3 Encounters:  10/10/24 150 lb (68 kg)  08/02/24 148 lb 12.8 oz (67.5 kg)  07/05/24 145 lb (65.8 kg)   Body mass index is 21.52 kg/m.    Physical Exam Constitutional:      General: He is not in acute distress.    Appearance: Normal appearance. He is not ill-appearing.  HENT:     Head: Normocephalic and atraumatic.  Eyes:     Conjunctiva/sclera: Conjunctivae normal.  Cardiovascular:     Rate and Rhythm: Normal rate and regular rhythm.     Heart sounds: Normal heart sounds.  Pulmonary:     Effort: Pulmonary effort is normal. No respiratory distress.     Breath sounds: Normal breath sounds. No wheezing or rales.  Musculoskeletal:      Right lower leg: No edema.     Left lower leg: No edema.  Skin:    General: Skin is warm and dry.     Findings: No rash.     Comments: Circular abrasion posterior right head with mild surrounding bruising.  Minimally tender.  No open wound, erythema.  Neurological:     Mental Status: He is alert. Mental status is at baseline.  Psychiatric:        Mood and Affect: Mood normal.        Lab Results  Component Value Date   WBC 8.2 06/22/2024   HGB 14.0 06/22/2024   HCT 42.0 06/22/2024   PLT 211 06/22/2024   GLUCOSE 134 (H) 06/22/2024   CHOL 93 01/04/2024   TRIG 55.0 01/04/2024   HDL 55.60 01/04/2024   LDLCALC 26 01/04/2024   ALT 24 06/22/2024   AST 23 06/22/2024   NA 139 06/22/2024   K 4.7 06/22/2024   CL 103 06/22/2024   CREATININE 1.25 (H) 06/22/2024   BUN 23 06/22/2024   CO2 24 06/22/2024   TSH 6.54 (H) 06/02/2024   INR 0.99 12/21/2009   HGBA1C 7.1 (H) 06/02/2024   MICROALBUR 1.0 04/11/2010     Assessment & Plan:  See Problem List for Assessment and Plan of chronic medical problems.   He continues to do well with all of his medications without any potential side effects.  Eating, drinking normally.  Given his age we could probably decrease his pill burden, but since he is doing so well for now we will continue all his current medications, but will not hesitate to discontinue diabetic medication and statin in the future if needed     [1]  Current Outpatient Medications on File Prior to Visit  Medication Sig Dispense Refill   Accu-Chek Softclix Lancets lancets FOLLOW PACKAGE DIRECTIONS TO CHECK BLOOD SUGARS DAILY 100 each 1   acetaminophen -codeine  (TYLENOL  #3) 300-30 MG tablet Take 1 tablet by mouth every 8 (eight) hours as needed for moderate pain (pain score 4-6). 15 tablet 0   Blood Glucose Monitoring Suppl (ACCU-CHEK GUIDE) w/Device KIT USE TO TEST BLOOD SUGAR DAILY 1 kit 0   diltiazem  (CARDIZEM  CD) 180 MG 24 hr capsule Take 1 capsule (180 mg total) by  mouth daily. 30 capsule 5   diltiazem  (CARDIZEM ) 30 MG tablet TAKE 1 TABLET BY MOUTH DAILY AS NEEDED 90 tablet 2   dronedarone  (MULTAQ ) 400 MG tablet Take 1 tablet (400 mg total) by mouth 2 (two) times daily with a meal. 180 tablet 3   ELIQUIS  5 MG TABS tablet TAKE 1 TABLET(5 MG) BY MOUTH TWICE DAILY 180 tablet 3   furosemide  (LASIX ) 20 MG tablet TAKE 1 TABLET(20 MG) BY MOUTH DAILY IN THE MORNING 2 tablet 0   glucose blood test strip Use to test blood sugars once daily. Dx code-E11.49 100 each 3   JANUMET  50-500 MG tablet TAKE 1 TABLET BY MOUTH DAILY 90 tablet 1   ketoconazole  (NIZORAL ) 2 % cream Apply 1 Application topically 2 (two) times daily. 30 g 5   levothyroxine  (SYNTHROID ) 50 MCG tablet TAKE 1 TABLET(50 MCG) BY MOUTH DAILY BEFORE BREAKFAST 90 tablet 1   LORazepam  (ATIVAN ) 0.5 MG tablet TAKE 1 TABLET(0.5 MG) BY MOUTH EVERY 12 HOURS AS NEEDED FOR ANXIETY 20 tablet 5   metoprolol  tartrate (LOPRESSOR ) 25 MG tablet Take 25 mg by mouth 2 (two) times daily.     mometasone (NASONEX) 50 MCG/ACT nasal spray SMARTSIG:2 Spray(s) Both Nares Every Night     mupirocin  ointment (BACTROBAN ) 2 % Apply to wound with dressing changes 30 g 1   polyethylene glycol (MIRALAX / GLYCOLAX) 17 g packet Take 17 g by mouth daily.     pravastatin  (PRAVACHOL ) 20 MG tablet TAKE 1 TABLET(20 MG) BY MOUTH DAILY 90 tablet 1   valACYclovir (VALTREX) 1000 MG tablet Take 4,000 mg by mouth once.     No current facility-administered medications on file prior to visit.   "

## 2024-10-09 NOTE — Patient Instructions (Addendum)
" ° ° ° ° °  Blood work was ordered.       Medications changes include :   None    A referral was ordered and someone will call you to schedule an appointment.     Return in about 3 months (around 01/08/2025) for follow up.  "

## 2024-10-10 ENCOUNTER — Ambulatory Visit: Admitting: Internal Medicine

## 2024-10-10 VITALS — BP 128/68 | HR 78 | Temp 98.0°F | Ht 70.0 in | Wt 150.0 lb

## 2024-10-10 DIAGNOSIS — R350 Frequency of micturition: Secondary | ICD-10-CM | POA: Diagnosis not present

## 2024-10-10 DIAGNOSIS — E1169 Type 2 diabetes mellitus with other specified complication: Secondary | ICD-10-CM

## 2024-10-10 DIAGNOSIS — E039 Hypothyroidism, unspecified: Secondary | ICD-10-CM | POA: Diagnosis not present

## 2024-10-10 DIAGNOSIS — E785 Hyperlipidemia, unspecified: Secondary | ICD-10-CM

## 2024-10-10 DIAGNOSIS — F419 Anxiety disorder, unspecified: Secondary | ICD-10-CM | POA: Diagnosis not present

## 2024-10-10 DIAGNOSIS — I480 Paroxysmal atrial fibrillation: Secondary | ICD-10-CM

## 2024-10-10 DIAGNOSIS — M542 Cervicalgia: Secondary | ICD-10-CM

## 2024-10-10 DIAGNOSIS — I152 Hypertension secondary to endocrine disorders: Secondary | ICD-10-CM

## 2024-10-10 DIAGNOSIS — S0091XA Abrasion of unspecified part of head, initial encounter: Secondary | ICD-10-CM | POA: Diagnosis not present

## 2024-10-10 DIAGNOSIS — E1159 Type 2 diabetes mellitus with other circulatory complications: Secondary | ICD-10-CM

## 2024-10-10 LAB — COMPREHENSIVE METABOLIC PANEL WITH GFR
ALT: 12 U/L (ref 3–53)
AST: 15 U/L (ref 5–37)
Albumin: 4 g/dL (ref 3.5–5.2)
Alkaline Phosphatase: 69 U/L (ref 39–117)
BUN: 26 mg/dL — ABNORMAL HIGH (ref 6–23)
CO2: 25 meq/L (ref 19–32)
Calcium: 9 mg/dL (ref 8.4–10.5)
Chloride: 104 meq/L (ref 96–112)
Creatinine, Ser: 1.32 mg/dL (ref 0.40–1.50)
GFR: 45.42 mL/min — ABNORMAL LOW
Glucose, Bld: 146 mg/dL — ABNORMAL HIGH (ref 70–99)
Potassium: 4.6 meq/L (ref 3.5–5.1)
Sodium: 138 meq/L (ref 135–145)
Total Bilirubin: 0.6 mg/dL (ref 0.2–1.2)
Total Protein: 7.3 g/dL (ref 6.0–8.3)

## 2024-10-10 LAB — LIPID PANEL
Cholesterol: 117 mg/dL (ref 28–200)
HDL: 72.5 mg/dL
LDL Cholesterol: 38 mg/dL (ref 10–99)
NonHDL: 44.15
Total CHOL/HDL Ratio: 2
Triglycerides: 33 mg/dL (ref 10.0–149.0)
VLDL: 6.6 mg/dL (ref 0.0–40.0)

## 2024-10-10 LAB — HEMOGLOBIN A1C: Hgb A1c MFr Bld: 7 % — ABNORMAL HIGH (ref 4.6–6.5)

## 2024-10-10 LAB — CBC
HCT: 40.1 % (ref 39.0–52.0)
Hemoglobin: 13.7 g/dL (ref 13.0–17.0)
MCHC: 34.1 g/dL (ref 30.0–36.0)
MCV: 95.8 fl (ref 78.0–100.0)
Platelets: 193 K/uL (ref 150.0–400.0)
RBC: 4.19 Mil/uL — ABNORMAL LOW (ref 4.22–5.81)
RDW: 15.4 % (ref 11.5–15.5)
WBC: 8.1 K/uL (ref 4.0–10.5)

## 2024-10-10 LAB — TSH: TSH: 3.9 u[IU]/mL (ref 0.35–5.50)

## 2024-10-10 MED ORDER — DILTIAZEM HCL ER COATED BEADS 180 MG PO CP24: 180.0000 mg | ORAL_CAPSULE | Freq: Every day | ORAL | 3 refills | Status: AC

## 2024-10-10 NOTE — Assessment & Plan Note (Signed)
 Chronic Continue regular exercise, healthy diet Lab Results  Component Value Date   LDLCALC 26 01/04/2024   Continue pravastatin  20 mg daily Check lipid panel, CMP

## 2024-10-10 NOTE — Assessment & Plan Note (Signed)
 Related to recent fall Abrasion is healing well and he has no concerning symptoms from the brain or head injury Discussed fall prevention He does use a cane to ambulate At home in the middle of the night he will use his walker He has done physical therapy multiple times to help with his balance Discussed that he may need to be using a walker more Stressed fall prevention

## 2024-10-10 NOTE — Assessment & Plan Note (Signed)
 Chronic Follows with cardiology On Eliquis  5 mg twice daily, Multaq  400 mg twice daily, diltiazem  180 mg daily, metoprolol  25 mg daily Has diltiazem  30 mg prn for episodes of A-fib if needed CBC, CMP

## 2024-10-10 NOTE — Assessment & Plan Note (Signed)
 Chronic TSH was elevated at his last visit Check TSH Continue levothyroxine  50 mcg daily

## 2024-10-10 NOTE — Assessment & Plan Note (Signed)
 Chronic Situational We agree that he will only take this at extreme cases of anxiety-has not taken it in a while.  Discussed possible increase of memory issues and increased risk of falls Continue lorazepam  0.5 mg twice daily as needed

## 2024-10-10 NOTE — Assessment & Plan Note (Signed)
 Chronic Associated with hyperlipidemia Lab Results  Component Value Date   HGBA1C 7.1 (H) 06/02/2024   Sugars well controlled for age Continue Janumet  50-500 mg daily Continue regular exercise, diabetic diet

## 2024-10-10 NOTE — Assessment & Plan Note (Signed)
 Chronic Blood pressure well controlled  Denies any lightheadedness or dizziness Continue diltiazem  to 180 mg daily, metoprolol  25 mg twice daily Monitor BP at home CMP, CBC

## 2024-10-10 NOTE — Assessment & Plan Note (Signed)
 Chronic Intermittent Continue Tylenol as needed

## 2024-10-12 ENCOUNTER — Ambulatory Visit: Payer: Self-pay | Admitting: Internal Medicine

## 2024-11-08 ENCOUNTER — Ambulatory Visit: Admitting: Podiatry

## 2025-01-09 ENCOUNTER — Ambulatory Visit: Admitting: Internal Medicine

## 2025-01-30 ENCOUNTER — Ambulatory Visit (HOSPITAL_COMMUNITY): Admitting: Internal Medicine
